# Patient Record
Sex: Male | Born: 1965 | Race: Black or African American | Hispanic: No | Marital: Married | Smoking: Never smoker
Health system: Southern US, Community
[De-identification: ages and names within clinical notes are randomized; demographics above are authoritative.]

## PROBLEM LIST (undated history)

## (undated) DIAGNOSIS — I2699 Other pulmonary embolism without acute cor pulmonale: Secondary | ICD-10-CM

## (undated) DIAGNOSIS — I10 Essential (primary) hypertension: Secondary | ICD-10-CM

## (undated) DIAGNOSIS — Z973 Presence of spectacles and contact lenses: Secondary | ICD-10-CM

## (undated) DIAGNOSIS — I82409 Acute embolism and thrombosis of unspecified deep veins of unspecified lower extremity: Secondary | ICD-10-CM

## (undated) DIAGNOSIS — M109 Gout, unspecified: Secondary | ICD-10-CM

## (undated) DIAGNOSIS — Z789 Other specified health status: Secondary | ICD-10-CM

## (undated) HISTORY — DX: Essential (primary) hypertension: I10

## (undated) HISTORY — PX: WISDOM TOOTH EXTRACTION: SHX21

## (undated) HISTORY — PX: ROTATOR CUFF REPAIR: SHX139

## (undated) HISTORY — DX: Acute embolism and thrombosis of unspecified deep veins of unspecified lower extremity: I82.409

## (undated) HISTORY — DX: Other pulmonary embolism without acute cor pulmonale: I26.99

---

## 2007-04-23 ENCOUNTER — Encounter (INDEPENDENT_AMBULATORY_CARE_PROVIDER_SITE_OTHER): Payer: Self-pay | Admitting: Urology

## 2007-04-23 ENCOUNTER — Other Ambulatory Visit: Admission: RE | Admit: 2007-04-23 | Discharge: 2007-04-23 | Payer: Self-pay | Admitting: Urology

## 2007-05-31 ENCOUNTER — Emergency Department (HOSPITAL_COMMUNITY): Admission: EM | Admit: 2007-05-31 | Discharge: 2007-05-31 | Payer: Self-pay | Admitting: Emergency Medicine

## 2012-08-06 ENCOUNTER — Encounter (HOSPITAL_BASED_OUTPATIENT_CLINIC_OR_DEPARTMENT_OTHER): Payer: Self-pay | Admitting: *Deleted

## 2012-08-06 NOTE — Progress Notes (Signed)
Very nice man in [employer] in [place]-no health problems-no labs needed

## 2012-08-11 ENCOUNTER — Other Ambulatory Visit: Payer: Self-pay | Admitting: Orthopedic Surgery
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
## 2012-08-13 ENCOUNTER — Encounter (HOSPITAL_BASED_OUTPATIENT_CLINIC_OR_DEPARTMENT_OTHER): Admission: RE | Payer: Self-pay | Source: Ambulatory Visit

## 2012-08-13 ENCOUNTER — Ambulatory Visit (HOSPITAL_BASED_OUTPATIENT_CLINIC_OR_DEPARTMENT_OTHER): Admission: RE | Admit: 2012-08-13 | Source: Ambulatory Visit | Admitting: Orthopedic Surgery

## 2012-08-13 HISTORY — DX: Other specified health status: Z78.9

## 2012-08-13 HISTORY — DX: Presence of spectacles and contact lenses: Z97.3

## 2012-08-13 SURGERY — KNEE ARTHROSCOPY WITH PATELLAR TENDON REPAIR
Anesthesia: General | Site: Knee | Laterality: Left

## 2012-08-13 NOTE — Interval H&P Note (Signed)
History and Physical Interval Note:  08/13/2012 7:21 AM  [name] [name]  has presented today for surgery, with the diagnosis of LEFT KNEE TENDON RUPTURE, PATELLAR TENDON NO TRAUMATIC, LOOSE BODY, OSTEOCHONDRAL FRAGMENT  The various methods of treatment have been discussed with the patient and family. After consideration of risks, benefits and other options for treatment, the patient has consented to  Procedure(s): LEFT KNEE ARTHROSCOPY WITH LOOSE BODY EXCISION AND PATELLAR TENDON REPAIR (Left) as a surgical intervention .  The patient's history has been reviewed, patient examined, no change in status, stable for surgery.  I have reviewed the patient's chart and labs.  Questions were answered to the patient's satisfaction.    New intervening injury with possible ACL tear, therefore possible ACL allograft reconstruction.   [name]

## 2012-08-28 ENCOUNTER — Encounter (HOSPITAL_BASED_OUTPATIENT_CLINIC_OR_DEPARTMENT_OTHER): Payer: Self-pay | Admitting: *Deleted

## 2012-08-31 NOTE — H&P (Signed)
[name] [name]/[facility] [address]   [address] [place], [place] [address] [phone] A Division of [facility]  [name] [name], M.D.   [name] [name], M.D.   [name] [name], M.D.   [name] [name], M.D.   [name] [name], M.D [name] [name] Dresser, M.D.  [name] [name], M.D.   [name] [name], M.D.   [name] [name], M.D. [name] Churn. Barry Dienes, PA-C            [name] A. Shepperson, PA-C [name] [place], PA-C [place], [place]   RE: [place], [place]                                [phone]      DOB: [birth year] PROGRESS NOTE: 07-21-12 [name] [name] is seen in consultation today for his left knee.  Previously seen and evaluated by Dr. Farris Has.  Longstanding history of symptoms in the left knee.  This goes back to adolescent years with Osgood-Schlatter's disease.  After finishing growing it became somewhat tolerable, but he has always had some degree of symptoms there.  Prominence of the tibial tubercle.  Symptoms off and on.  This has recently flared up after a 5k race with marked persistent soreness and pain directly in that area.  This has reached a point that he wants to consider definitive treatment, as it has never been right and his symptoms are getting more frequent and more bothersome.  Previous x-rays have shown significant spurring and fragmentation of the tibial tubercle from old Osgood-Schlatter's disease, as well as a loose body up in the patellar tendon just above the tubercle.  I have looked at those films.  There is also a question of a small loose body in the knee in the notch anteriorly.  No significant degenerative changes.  A little deep seated soreness, but no real instability or mechanical symptoms.  I met with he and his wife.  I have reviewed evaluation and treatment notes by Dr. Farris Has.  No scan has been done.  He has tried anti-inflammatories, [name] [name] bracing, icing and rest, as well as a dose pack.  None of this giving him prolonged  relief.  Discussed all of this at length with [name] [name] and his wife.   Remaining history and general exam is outlined and included in the chart.  He is currently still in active [employer].    EXAMINATION: On his exam, marked prominent tibial tubercle on the left.  Exquisitely tender.  A little prominence on the right, not nearly as much.  Doesn't have significant patella alta.  Give way weakness on testing patellar tendon, but no obvious defects.  His knee is stable to stress.  A little synovitis.  Really no meniscal signs.  No instability.  Neurovascularly intact distally.    DISPOSITION:  We have discussed definitive treatment, which I do feel is indicated.  Exam under anesthesia.  Arthroscopy to assess the knee and remove loose body if I find one.  Open intervention tibial tubercle.  I have told him outcome and amount of time to return to full activity is really going to depend on how much of the tendon has to be detached to clean this out.  We have talked about thorough debridement of spur, loose body and then an anchor reattachment patellar tendon.  I told him this could result in a knee immobilizer for six weeks and then another six weeks of rehab, 12  weeks before getting back to full activity.  Obviously if I don't have to disrupt as much of the patellar tendon we can move along more quickly with rehab.  All questions answered.  Paperwork complete.  The risks, benefits and complications were reviewed.  I will see him at the time of operative intervention.  He is going to explore the possibility of going back to light duty and let me know options when I see him at operative intervention.     [name] [name], M.D.   Electronically verified by [name] [name], M.D. [name] [name]:jjh D 07-21-12 T 07-22-12  [name] [name]/[facility] [address]   [address] [place], [place] [address] [phone] A Division of [facility]  [name] [name], M.D.    [name] [name], M.D.   [name] [name], M.D.   [name] [name], M.D.   [name] [name], M.D [name] [name] Dresser, M.D.  [name] [name], M.D.   [name] [name], M.D.   [name] [name], M.D. [name] Churn. Barry Dienes, PA-C            [name] A. Shepperson, PA-C [name] [place], PA-C [place], [place]   RE: [place], [place]                                [phone]      DOB: 1965/07/16 PROGRESS NOTE: 08-25-12 I went over [name] [name]'s MRI and we have talked to him on the phone.  Unfortunately he has exacerbated his previous condition causing a little bit more tearing and inflammatory change at the tibial tubercle.  Other structures in his knee look good in regards to ligaments and meniscus.  There are some inflammatory changes in Hoffa's fat pad and based on previous x-ray there is still a question of some chondral debris, which is obviously not seen on MRI.  In light of these findings, we are going to proceed with our original plan.  Exam under anesthesia, arthroscopy.  Open debridement tibial tubercle with debridement and repair of the patella tendon.  This is what we had planned to do before and I am now reassured that his new injury did not change matters much.  This has been reviewed with him.  I will see him at the time of operative intervention.    [name] [name], M.D.   Electronically verified by [name] [name], M.D. [name] [name]:jjh D 08-25-12 T 08-26-12

## 2012-09-02 ENCOUNTER — Other Ambulatory Visit: Payer: Self-pay | Admitting: Orthopedic Surgery

## 2012-09-03 ENCOUNTER — Encounter (HOSPITAL_BASED_OUTPATIENT_CLINIC_OR_DEPARTMENT_OTHER)
Admission: RE | Disposition: A | Discharge status: Home / Self Care | Payer: Self-pay | Source: Ambulatory Visit | Attending: Orthopedic Surgery

## 2012-09-03 ENCOUNTER — Ambulatory Visit (HOSPITAL_BASED_OUTPATIENT_CLINIC_OR_DEPARTMENT_OTHER)
Admission: RE | Admit: 2012-09-03 | Discharge: 2012-09-03 | Disposition: A | Discharge status: Home / Self Care | Source: Ambulatory Visit | Attending: Orthopedic Surgery | Admitting: Orthopedic Surgery

## 2012-09-03 ENCOUNTER — Ambulatory Visit (HOSPITAL_BASED_OUTPATIENT_CLINIC_OR_DEPARTMENT_OTHER): Admitting: Anesthesiology

## 2012-09-03 ENCOUNTER — Encounter (HOSPITAL_BASED_OUTPATIENT_CLINIC_OR_DEPARTMENT_OTHER): Payer: Self-pay | Admitting: Anesthesiology

## 2012-09-03 DIAGNOSIS — M2340 Loose body in knee, unspecified knee: Secondary | ICD-10-CM | POA: Insufficient documentation

## 2012-09-03 DIAGNOSIS — M66269 Spontaneous rupture of extensor tendons, unspecified lower leg: Secondary | ICD-10-CM | POA: Insufficient documentation

## 2012-09-03 DIAGNOSIS — M928 Other specified juvenile osteochondrosis: Secondary | ICD-10-CM | POA: Insufficient documentation

## 2012-09-03 HISTORY — PX: KNEE ARTHROSCOPY WITH PATELLAR TENDON REPAIR: SHX5656

## 2012-09-03 LAB — POCT HEMOGLOBIN-HEMACUE: Hemoglobin: 15 g/dL (ref 13.0–17.0)

## 2012-09-03 SURGERY — KNEE ARTHROSCOPY WITH PATELLAR TENDON REPAIR
Anesthesia: Regional | Site: Knee | Laterality: Left | Wound class: Clean

## 2012-09-03 MED ORDER — OXYCODONE-ACETAMINOPHEN 10-325 MG PO TABS: 1.0000 | ORAL_TABLET | Freq: Four times a day (QID) | ORAL | Status: DC | PRN

## 2012-09-03 MED ORDER — CHLORHEXIDINE GLUCONATE 4 % EX LIQD: 60.0000 mL | Freq: Once | CUTANEOUS | Status: DC

## 2012-09-03 MED ORDER — LACTATED RINGERS IV SOLN
INTRAVENOUS | Status: DC
  Administered 2012-09-03: 10:00:00 via INTRAVENOUS

## 2012-09-03 MED ORDER — HYDROMORPHONE HCL PF 1 MG/ML IJ SOLN
0.2500 mg | INTRAMUSCULAR | Status: DC | PRN
  Administered 2012-09-03 (×2): 0.5 mg via INTRAVENOUS

## 2012-09-03 MED ORDER — PROPOFOL 10 MG/ML IV BOLUS
INTRAVENOUS | Status: DC | PRN
  Administered 2012-09-03: 260 mg via INTRAVENOUS

## 2012-09-03 MED ORDER — OXYCODONE HCL 5 MG PO TABS: 5.0000 mg | ORAL_TABLET | Freq: Once | ORAL | Status: DC | PRN

## 2012-09-03 MED ORDER — LIDOCAINE HCL (CARDIAC) 20 MG/ML IV SOLN
INTRAVENOUS | Status: DC | PRN
  Administered 2012-09-03: 80 mg via INTRAVENOUS

## 2012-09-03 MED ORDER — OXYCODONE HCL 5 MG/5ML PO SOLN: 5.0000 mg | Freq: Once | ORAL | Status: DC | PRN

## 2012-09-03 MED ORDER — MIDAZOLAM HCL 2 MG/2ML IJ SOLN
1.0000 mg | INTRAMUSCULAR | Status: DC | PRN
  Administered 2012-09-03: 2 mg via INTRAVENOUS

## 2012-09-03 MED ORDER — ONDANSETRON HCL 4 MG/2ML IJ SOLN
INTRAMUSCULAR | Status: DC | PRN
  Administered 2012-09-03: 4 mg via INTRAVENOUS

## 2012-09-03 MED ORDER — BUPIVACAINE-EPINEPHRINE PF 0.5-1:200000 % IJ SOLN
INTRAMUSCULAR | Status: DC | PRN
  Administered 2012-09-03: 25 mL

## 2012-09-03 MED ORDER — SODIUM CHLORIDE 0.9 % IR SOLN
Status: DC | PRN
  Administered 2012-09-03: 6000 mL

## 2012-09-03 MED ORDER — LACTATED RINGERS IV SOLN: INTRAVENOUS | Status: DC

## 2012-09-03 MED ORDER — CEFAZOLIN SODIUM-DEXTROSE 2-3 GM-% IV SOLR: 2.0000 g | INTRAVENOUS | Status: DC

## 2012-09-03 MED ORDER — FENTANYL CITRATE 0.05 MG/ML IJ SOLN
50.0000 ug | INTRAMUSCULAR | Status: DC | PRN
  Administered 2012-09-03: 100 ug via INTRAVENOUS

## 2012-09-03 MED ORDER — METOCLOPRAMIDE HCL 5 MG/ML IJ SOLN: 10.0000 mg | Freq: Once | INTRAMUSCULAR | Status: DC | PRN

## 2012-09-03 MED ORDER — DEXAMETHASONE SODIUM PHOSPHATE 4 MG/ML IJ SOLN
INTRAMUSCULAR | Status: DC | PRN
  Administered 2012-09-03: 10 mg via INTRAVENOUS

## 2012-09-03 MED ORDER — DEXTROSE 5 % IV SOLN
3.0000 g | INTRAVENOUS | Status: AC
  Administered 2012-09-03: 2 g via INTRAVENOUS

## 2012-09-03 MED ORDER — FENTANYL CITRATE 0.05 MG/ML IJ SOLN
INTRAMUSCULAR | Status: DC | PRN
  Administered 2012-09-03 (×2): 50 ug via INTRAVENOUS

## 2012-09-03 SURGICAL SUPPLY — 64 items
ANCHOR CORKSCREW BIO 4.5 (Anchor) ×2 IMPLANT
BANDAGE ELASTIC 6 VELCRO ST LF (GAUZE/BANDAGES/DRESSINGS) ×2 IMPLANT
BANDAGE ESMARK 6X9 LF (GAUZE/BANDAGES/DRESSINGS) ×1 IMPLANT
BENZOIN TINCTURE PRP APPL 2/3 (GAUZE/BANDAGES/DRESSINGS) ×2 IMPLANT
BLADE CUDA 5.5 (BLADE) IMPLANT
BLADE CUDA GRT WHITE 3.5 (BLADE) IMPLANT
BLADE CUTTER GATOR 3.5 (BLADE) ×2 IMPLANT
BLADE CUTTER MENIS 5.5 (BLADE) IMPLANT
BLADE GREAT WHITE 4.2 (BLADE) ×2 IMPLANT
BLADE SURG 15 STRL LF DISP TIS (BLADE) ×2 IMPLANT
BLADE SURG 15 STRL SS (BLADE) ×2
BNDG ESMARK 6X9 LF (GAUZE/BANDAGES/DRESSINGS) ×2
BUR OVAL 4.0 (BURR) IMPLANT
CANISTER OMNI JUG 16 LITER (MISCELLANEOUS) IMPLANT
CANISTER SUCTION 2500CC (MISCELLANEOUS) IMPLANT
CLOTH BEACON ORANGE TIMEOUT ST (SAFETY) ×2 IMPLANT
CUTTER MENISCUS  4.2MM (BLADE)
CUTTER MENISCUS 4.2MM (BLADE) IMPLANT
DRAPE ARTHROSCOPY W/POUCH 90 (DRAPES) ×2 IMPLANT
DRAPE U-SHAPE 47X51 STRL (DRAPES) ×2 IMPLANT
DURAPREP 26ML APPLICATOR (WOUND CARE) ×2 IMPLANT
ELECT MENISCUS 165MM 90D (ELECTRODE) IMPLANT
ELECT REM PT RETURN 9FT ADLT (ELECTROSURGICAL) ×2
ELECTRODE REM PT RTRN 9FT ADLT (ELECTROSURGICAL) ×1 IMPLANT
GAUZE XEROFORM 1X8 LF (GAUZE/BANDAGES/DRESSINGS) ×2 IMPLANT
GLOVE BIOGEL M STRL SZ7.5 (GLOVE) ×2 IMPLANT
GLOVE BIOGEL PI IND STRL 8 (GLOVE) ×1 IMPLANT
GLOVE BIOGEL PI INDICATOR 8 (GLOVE) ×1
GLOVE ECLIPSE 6.5 STRL STRAW (GLOVE) ×2 IMPLANT
GLOVE EXAM NITRILE LRG STRL (GLOVE) ×2 IMPLANT
GLOVE ORTHO TXT STRL SZ7.5 (GLOVE) ×2 IMPLANT
GLOVE SURG SS PI 6.5 STRL IVOR (GLOVE) ×2 IMPLANT
GOWN PREVENTION PLUS XLARGE (GOWN DISPOSABLE) ×4 IMPLANT
GOWN PREVENTION PLUS XXLARGE (GOWN DISPOSABLE) ×4 IMPLANT
GOWN STRL REIN 2XL XLG LVL4 (GOWN DISPOSABLE) IMPLANT
IMMOBILIZER KNEE 22 UNIV (SOFTGOODS) IMPLANT
IMMOBILIZER KNEE 24 THIGH 36 (MISCELLANEOUS) ×1 IMPLANT
IMMOBILIZER KNEE 24 UNIV (MISCELLANEOUS) ×2
IV NS IRRIG 3000ML ARTHROMATIC (IV SOLUTION) ×4 IMPLANT
KNEE WRAP E Z 3 GEL PACK (MISCELLANEOUS) ×2 IMPLANT
NDL SUT 6 .5 CRC .975X.05 MAYO (NEEDLE) ×1 IMPLANT
NEEDLE MAYO TAPER (NEEDLE) ×1
PACK ARTHROSCOPY DSU (CUSTOM PROCEDURE TRAY) ×2 IMPLANT
PACK BASIN DAY SURGERY FS (CUSTOM PROCEDURE TRAY) ×2 IMPLANT
PENCIL BUTTON HOLSTER BLD 10FT (ELECTRODE) ×2 IMPLANT
SET ARTHROSCOPY TUBING (MISCELLANEOUS) ×2
SET ARTHROSCOPY TUBING LN (MISCELLANEOUS) ×2 IMPLANT
SPONGE GAUZE 4X4 12PLY (GAUZE/BANDAGES/DRESSINGS) ×4 IMPLANT
SPONGE LAP 4X18 X RAY DECT (DISPOSABLE) ×2 IMPLANT
STRIP CLOSURE SKIN 1/2X4 (GAUZE/BANDAGES/DRESSINGS) ×2 IMPLANT
SUCTION FRAZIER TIP 10 FR DISP (SUCTIONS) ×2 IMPLANT
SUT ETHILON 3 0 PS 1 (SUTURE) ×2 IMPLANT
SUT FIBERWIRE #2 38 T-5 BLUE (SUTURE)
SUT VIC AB 0 CT1 27 (SUTURE) ×1
SUT VIC AB 0 CT1 27XBRD ANBCTR (SUTURE) ×1 IMPLANT
SUT VIC AB 1 CT1 27 (SUTURE) ×1
SUT VIC AB 1 CT1 27XBRD ANBCTR (SUTURE) ×1 IMPLANT
SUT VIC AB 3-0 FS2 27 (SUTURE) ×2 IMPLANT
SUT VIC AB 3-0 SH 27 (SUTURE) ×1
SUT VIC AB 3-0 SH 27X BRD (SUTURE) ×1 IMPLANT
SUTURE FIBERWR #2 38 T-5 BLUE (SUTURE) IMPLANT
TOWEL OR 17X24 6PK STRL BLUE (TOWEL DISPOSABLE) ×2 IMPLANT
WATER STERILE IRR 1000ML POUR (IV SOLUTION) ×2 IMPLANT
YANKAUER SUCT BULB TIP NO VENT (SUCTIONS) ×2 IMPLANT

## 2012-09-03 NOTE — Progress Notes (Signed)
Assisted Dr. Frederick with left, ultrasound guided, femoral block. Side rails up, monitors on throughout procedure. See vital signs in flow sheet. Tolerated Procedure well. 

## 2012-09-03 NOTE — Anesthesia Postprocedure Evaluation (Signed)
Anesthesia Post Note  Patient: [name] [name]  Procedure(s) Performed: Procedure(s) (LRB): LEFT KNEE ARTHROSCOPY, LOOSE BODY EXCISION, PATELLAR TENDON REPAIR, EXCISION TIBIAL TUBERCLE SPUR (Left)  Anesthesia type: General  Patient location: PACU  Post pain: Pain level controlled  Post assessment: Patient's Cardiovascular Status Stable  Last Vitals:  Filed Vitals:   09/03/12 1300  BP: 141/92  Pulse: 71  Temp:   Resp: 15    Post vital signs: Reviewed and stable  Level of consciousness: alert  Complications: No apparent anesthesia complications

## 2012-09-03 NOTE — Interval H&P Note (Signed)
History and Physical Interval Note:  09/03/2012 7:32 AM  [name] [name]  has presented today for surgery, with the diagnosis of LEFT KNEE TENDON RUPTURE-PATELLAR TENDON NON TRAUMATIC, LOOSE BODY-OSTEOCHONDRAL FRAGMENT  The various methods of treatment have been discussed with the patient and family. After consideration of risks, benefits and other options for treatment, the patient has consented to  Procedure(s): LEFT KNEE ARTHROSCOPY, LOOSE BODY EXCISION, PATELLAR TENDON REPAIR, EXCISION TIBIAL TUBERCLE SPUR (Left) as a surgical intervention .  The patient's history has been reviewed, patient examined, no change in status, stable for surgery.  I have reviewed the patient's chart and labs.  Questions were answered to the patient's satisfaction.     Vela Render F

## 2012-09-03 NOTE — Anesthesia Preprocedure Evaluation (Signed)
Anesthesia Evaluation  Patient identified by MRN, date of birth, ID band Patient awake    Reviewed: Allergy & Precautions, H&P , NPO status , Patient's Chart, lab work & pertinent test results, reviewed documented beta blocker date and time   Airway Mallampati: II TM Distance: >3 FB Neck ROM: full    Dental   Pulmonary neg pulmonary ROS,  breath sounds clear to auscultation        Cardiovascular negative cardio ROS  Rhythm:regular     Neuro/Psych negative neurological ROS  negative psych ROS   GI/Hepatic negative GI ROS, Neg liver ROS,   Endo/Other  negative endocrine ROS  Renal/GU negative Renal ROS  negative genitourinary   Musculoskeletal   Abdominal   Peds  Hematology negative hematology ROS (+)   Anesthesia Other Findings See surgeon's H&P   Reproductive/Obstetrics negative OB ROS                           Anesthesia Physical Anesthesia Plan  ASA: II  Anesthesia Plan: General   Post-op Pain Management:    Induction: Intravenous  Airway Management Planned: LMA  Additional Equipment:   Intra-op Plan:   Post-operative Plan:   Informed Consent: I have reviewed the patients History and Physical, chart, labs and discussed the procedure including the risks, benefits and alternatives for the proposed anesthesia with the patient or authorized representative who has indicated his/her understanding and acceptance.   Dental Advisory Given  Plan Discussed with: CRNA and Surgeon  Anesthesia Plan Comments:         Anesthesia Quick Evaluation  

## 2012-09-03 NOTE — Transfer of Care (Signed)
Immediate Anesthesia Transfer of Care Note  Patient: [name] [name]  Procedure(s) Performed: Procedure(s): LEFT KNEE ARTHROSCOPY, LOOSE BODY EXCISION, PATELLAR TENDON REPAIR, EXCISION TIBIAL TUBERCLE SPUR (Left)  Patient Location: PACU  Anesthesia Type:GA combined with regional for post-op pain  Level of Consciousness: sedated  Airway & Oxygen Therapy: Patient Spontanous Breathing and Patient connected to face mask oxygen  Post-op Assessment: Report given to PACU RN and Post -op Vital signs reviewed and stable  Post vital signs: Reviewed and stable  Complications: No apparent anesthesia complications

## 2012-09-03 NOTE — Interval H&P Note (Signed)
History and Physical Interval Note:  09/03/2012 7:32 AM  [name] [name]  has presented today for surgery, with the diagnosis of LEFT KNEE TENDON RUPTURE-PATELLAR TENDON NON TRAUMATIC, LOOSE BODY-OSTEOCHONDRAL FRAGMENT  The various methods of treatment have been discussed with the patient and family. After consideration of risks, benefits and other options for treatment, the patient has consented to  Procedure(s): LEFT KNEE ARTHROSCOPY, LOOSE BODY EXCISION, PATELLAR TENDON REPAIR, EXCISION TIBIAL TUBERCLE SPUR (Left) as a surgical intervention .  The patient's history has been reviewed, patient examined, no change in status, stable for surgery.  I have reviewed the patient's chart and labs.  Questions were answered to the patient's satisfaction.     [name]   

## 2012-09-03 NOTE — Anesthesia Procedure Notes (Addendum)
Anesthesia Regional Block:  Femoral nerve block  Pre-Anesthetic Checklist: ,, timeout performed, Correct Patient, Correct Site, Correct Laterality, Correct Procedure, Correct Position, site marked, Risks and benefits discussed,  Surgical consent,  Pre-op evaluation,  At surgeon's request and post-op pain management  Laterality: Left  Prep: chloraprep       Needles:   Needle Type: Other     Needle Length: 9cm  Needle Gauge: 21    Additional Needles:  Procedures: ultrasound guided (picture in chart) Femoral nerve block Narrative:  Start time: 09/03/2012 9:52 AM End time: 09/03/2012 9:58 AM Injection made incrementally with aspirations every 5 mL.  Performed by: Personally  Anesthesiologist: C. Frederick MD  Additional Notes: Ultrasound guidance used to: id relevant anatomy, confirm needle position, local anesthetic spread, avoidance of vascular puncture. Picture saved. No complications. Block performed personally by Janetta Hora. Gelene Mink, MD    Femoral nerve block Procedure Name: LMA Insertion Performed by: York Grice Pre-anesthesia Checklist: Patient identified, Timeout performed, Emergency Drugs available, Suction available and Patient being monitored Patient Re-evaluated:Patient Re-evaluated prior to inductionOxygen Delivery Method: Circle system utilized Preoxygenation: Pre-oxygenation with 100% oxygen Intubation Type: IV induction Ventilation: Mask ventilation without difficulty LMA: LMA with gastric port inserted LMA Size: 4.0 Number of attempts: 1 Placement Confirmation: breath sounds checked- equal and bilateral and positive ETCO2 Tube secured with: Tape Dental Injury: Teeth and Oropharynx as per pre-operative assessment

## 2012-09-04 ENCOUNTER — Encounter (HOSPITAL_BASED_OUTPATIENT_CLINIC_OR_DEPARTMENT_OTHER): Payer: Self-pay | Admitting: Orthopedic Surgery

## 2012-09-04 NOTE — Op Note (Signed)
NAME:  [name] [name], [name] [name]                 ACCOUNT NO.:  [id number]  MEDICAL RECORD NO.:  [id number]  LOCATION:                               FACILITY:  [facility]  PHYSICIAN:  Loreta Ave, M.D. DATE OF BIRTH:  1966-03-06  DATE OF PROCEDURE:  09/03/2012 DATE OF DISCHARGE:  09/03/2012                              OPERATIVE REPORT   PREOPERATIVE DIAGNOSIS:  Left knee chronic Osgood-Schlatter's disease. Loose bodies, tibial tubercle.  Severe tendinosis tearing of patellar tendon at the attachment.  Reactive synovitis of the knee with question loose bodies.  POSTOPERATIVE DIAGNOSIS:  Left knee chronic Osgood-Schlatter's disease. Loose bodies, tibial tubercle.  Severe tendinosis tearing of patellar tendon at the attachment.  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 Reactive synovitis throughout the knee, especially anteriorly and a large medial plica.  PROCEDURE:  Left knee exam under anesthesia, arthroscopy.  Chondroplasty of the patellofemoral joint, medial femoral condyle.  Removal of loose bodies.  Partial synovectomy, excision of medial plica.  Open debridement of the patellar tendon.  Removal of tibial tubercle spurs and loose bodies.  Repair of patellar tendon back to the attachment site augmented with a Bio composite 4.5 mm anchor and FiberWire suture.  SURGEON:  Loreta Ave, M.D.  ASSISTANT:  Skip Mayer, PA, present throughout the entire case and necessary for timely completion of the procedure.  ANESTHESIA:  General.  ESTIMATED BLOOD LOSS:  Minimal.  SPECIMENS:  None.  CULTURES:  None.  COMPLICATION:  None.  DRESSING:  Soft  compressive knee immobilizer.  TOURNIQUET TIME:  45 minutes.  DETAILS OF THE PROCEDURE:  The patient was brought to operating room, placed on the operating table in supine position.  After adequate anesthesia had been obtained, tourniquet applied.  Prepped and draped in the usual sterile fashion.  Exsanguinated with elevation of Esmarch. Tourniquet inflated to 350 mmHg.  Straight incision from below the patella over the tibial tubercle where there was a marked prominence. Skin and subcutaneous tissues were divided.  Patella was then exposed. Inferior 1-2 cm over more than the middle 50% had marked tendinosis, partial tearing, severe degeneration.  Open longitudinally up and healthy tissue.  Retracted both ways and debrided back to healthy tissue.  The tibial tubercle exposed.  Loose body removed.  The bone taken down to a nice smooth contour removing all prominence. Fluoroscopic confirmation.  Wound irrigated.  I then placed a 4.5 mm anchor at the attachment site.  The tendon was repaired to that utilizing FiberWire suture in a baseball stitch.  Nice firm re-anchored and reattachment.  I could bring it through full motion without too much tension.  Utilizing the anterior incision, I had made anteromedial and anterolateral portals.  Arthroscope was introduced.  Knee distended and inspected.  A lot of chondral debris and loose bodies.  Chondroplasty to a stable surface throughout.  Grade 3 change  on the trochlea, medial femoral condyle, all debrided.  Reasonable tracking.  Cruciate ligaments intact.  Both menisci were intact without tears.  Large fibrotic medial plica excised.  Hypertrophic synovitis from the knee, all debrided.  The entire knee examined to be sure all chondral loose bodies were removed. Instruments were completely removed.  Wound irrigated.  Closed with Vicryl with a subcuticular closure.  Sterile compressive dressing applied.  Tourniquet deflated and removed.  Knee  immobilizer applied. Anesthesia reversed.  Brought to the recovery room.  Tolerated the surgery well.  No complications.     Loreta Ave, M.D.     DFM/MEDQ  D:  09/03/2012  T:  09/04/2012  Job:  [id number]

## 2012-10-21 ENCOUNTER — Encounter: Payer: Self-pay | Admitting: Family Medicine

## 2012-10-21 ENCOUNTER — Ambulatory Visit (INDEPENDENT_AMBULATORY_CARE_PROVIDER_SITE_OTHER): Admitting: Family Medicine

## 2012-10-21 VITALS — BP 133/81 | HR 67 | Temp 98.4°F | Ht 73.0 in | Wt 242.0 lb

## 2012-10-21 DIAGNOSIS — IMO0002 Reserved for concepts with insufficient information to code with codable children: Secondary | ICD-10-CM

## 2012-10-21 DIAGNOSIS — M1712 Unilateral primary osteoarthritis, left knee: Secondary | ICD-10-CM

## 2012-10-21 DIAGNOSIS — M1710 Unilateral primary osteoarthritis, unspecified knee: Secondary | ICD-10-CM

## 2012-10-21 NOTE — Patient Instructions (Signed)
Knee Pain  The knee is the complex joint between your thigh and your lower leg. It is made up of bones, tendons, ligaments, and cartilage. The bones that make up the knee are:   The femur in the thigh.   The tibia and fibula in the lower leg.   The patella or kneecap riding in the groove on the lower femur.  CAUSES   Knee pain is a common complaint with many causes. A few of these causes are:   Injury, such as:   A ruptured ligament or tendon injury.   Torn cartilage.   Medical conditions, such as:   Gout   Arthritis   Infections   Overuse, over training or overdoing a physical activity.  Knee pain can be minor or severe. Knee pain can accompany debilitating injury. Minor knee problems often respond well to self-care measures or get well on their own. More serious injuries may need medical intervention or even surgery.  SYMPTOMS  The knee is complex. Symptoms of knee problems can vary widely. Some of the problems are:   Pain with movement and weight bearing.   Swelling and tenderness.   Buckling of the knee.   Inability to straighten or extend your knee.   Your knee locks and you cannot straighten it.   Warmth and redness with pain and fever.   Deformity or dislocation of the kneecap.  DIAGNOSIS   Determining what is wrong may be very straight forward such as when there is an injury. It can also be challenging because of the complexity of the knee. Tests to make a diagnosis may include:   Your caregiver taking a history and doing a physical exam.   Routine X-rays can be used to rule out other problems. X-rays will not reveal a cartilage tear. Some injuries of the knee can be diagnosed by:   Arthroscopy a surgical technique by which a small video camera is inserted through tiny incisions on the sides of the knee. This procedure is used to examine and repair internal knee joint problems. Tiny instruments can be used during arthroscopy to repair the torn knee cartilage (meniscus).   Arthrography  is a radiology technique. A contrast liquid is directly injected into the knee joint. Internal structures of the knee joint then become visible on X-ray film.   An MRI scan is a non x-ray radiology procedure in which magnetic fields and a computer produce two- or three-dimensional images of the inside of the knee. Cartilage tears are often visible using an MRI scanner. MRI scans have largely replaced arthrography in diagnosing cartilage tears of the knee.   Blood work.   Examination of the fluid that helps to lubricate the knee joint (synovial fluid). This is done by taking a sample out using a needle and a syringe.  TREATMENT  The treatment of knee problems depends on the cause. Some of these treatments are:   Depending on the injury, proper casting, splinting, surgery or physical therapy care will be needed.   Give yourself adequate recovery time. Do not overuse your joints. If you begin to get sore during workout routines, back off. Slow down or do fewer repetitions.   For repetitive activities such as cycling or running, maintain your strength and nutrition.   Alternate muscle groups. For example if you are a weight lifter, work the upper body on one day and the lower body the next.   Either tight or weak muscles do not give the proper support for your   knee. Tight or weak muscles do not absorb the stress placed on the knee joint. Keep the muscles surrounding the knee strong.   Take care of mechanical problems.   If you have flat feet, orthotics or special shoes may help. See your caregiver if you need help.   Arch supports, sometimes with wedges on the inner or outer aspect of the heel, can help. These can shift pressure away from the side of the knee most bothered by osteoarthritis.   A brace called an "unloader" brace also may be used to help ease the pressure on the most arthritic side of the knee.   If your caregiver has prescribed crutches, braces, wraps or ice, use as directed. The acronym for  this is PRICE. This means protection, rest, ice, compression and elevation.   Nonsteroidal anti-inflammatory drugs (NSAID's), can help relieve pain. But if taken immediately after an injury, they may actually increase swelling. Take NSAID's with food in your stomach. Stop them if you develop stomach problems. Do not take these if you have a history of ulcers, stomach pain or bleeding from the bowel. Do not take without your caregiver's approval if you have problems with fluid retention, heart failure, or kidney problems.   For ongoing knee problems, physical therapy may be helpful.   Glucosamine and chondroitin are over-the-counter dietary supplements. Both may help relieve the pain of osteoarthritis in the knee. These medicines are different from the usual anti-inflammatory drugs. Glucosamine may decrease the rate of cartilage destruction.   Injections of a corticosteroid drug into your knee joint may help reduce the symptoms of an arthritis flare-up. They may provide pain relief that lasts a few months. You may have to wait a few months between injections. The injections do have a small increased risk of infection, water retention and elevated blood sugar levels.   Hyaluronic acid injected into damaged joints may ease pain and provide lubrication. These injections may work by reducing inflammation. A series of shots may give relief for as long as 6 months.   Topical painkillers. Applying certain ointments to your skin may help relieve the pain and stiffness of osteoarthritis. Ask your pharmacist for suggestions. Many over the-counter products are approved for temporary relief of arthritis pain.   In some countries, doctors often prescribe topical NSAID's for relief of chronic conditions such as arthritis and tendinitis. A review of treatment with NSAID creams found that they worked as well as oral medications but without the serious side effects.  PREVENTION   Maintain a healthy weight. Extra pounds put  more strain on your joints.   Get strong, stay limber. Weak muscles are a common cause of knee injuries. Stretching is important. Include flexibility exercises in your workouts.   Be smart about exercise. If you have osteoarthritis, chronic knee pain or recurring injuries, you may need to change the way you exercise. This does not mean you have to stop being active. If your knees ache after jogging or playing basketball, consider switching to swimming, water aerobics or other low-impact activities, at least for a few days a week. Sometimes limiting high-impact activities will provide relief.   Make sure your shoes fit well. Choose footwear that is right for your sport.   Protect your knees. Use the proper gear for knee-sensitive activities. Use kneepads when playing volleyball or laying carpet. Buckle your seat belt every time you drive. Most shattered kneecaps occur in car accidents.   Rest when you are tired.  SEEK MEDICAL CARE IF:     You have knee pain that is continual and does not seem to be getting better.   SEEK IMMEDIATE MEDICAL CARE IF:   Your knee joint feels hot to the touch and you have a high fever.  MAKE SURE YOU:    Understand these instructions.   Will watch your condition.   Will get help right away if you are not doing well or get worse.  Document Released: 12/23/2006 Document Revised: 05/20/2011 Document Reviewed: 12/23/2006  [employer] Patient Information 2014 [employer].

## 2012-10-21 NOTE — Progress Notes (Signed)
  Subjective:    Patient ID: [name] J Guinea-Bissau Jr., male    DOB: October 01, 1965, 47 y.o.   MRN: [id number]  HPI This 47 y.o. male presents for evaluation of right leg and knee discomfort.  He has had right Knee surgery on 10/03/12 and has been seeing orthopedics who are prescribing him pain medication. He has been having some swelling in his right leg, knee, and ankle but it is better since surgery. He denies any pain in his calf or thigh just in his right knee.    Review of Systems C/o right knee pain No chest pain, SOB, HA, dizziness, vision change, N/V, diarrhea, constipation, dysuria, urinary urgency or frequency, myalgias or rash.     Objective:   Physical Exam Vital signs noted  Well developed well nourished male.  HEENT - Head atraumatic Normocephalic                Eyes - PERRLA, Conjuctiva - clear Sclera- Clear EOMI                Ears - EAC's Wnl TM's Wnl Gross Hearing WNL                Nose - Nares patent                 Throat - oropharanx wnl Respiratory - Lungs CTA bilateral Cardiac - RRR S1 and S2 without murmur MS - Right Knee with swelling and incision mid patella with steri strips Approximating.  Swelling in his right  Ankle and pre-tibial region.       Assessment & Plan:  Left knee DJD - Plan: Ambulatory referral to Physical Therapy.  Discussed with patient that he needs To get pain medicine from ortho since he is being rx'd pain meds by them and is still following up with Them.  He has family hx of colon cancer.  I have advised him to follow up for CPE and he needs colonoscopy and that He should follow up in 3 months.

## 2012-10-27 ENCOUNTER — Telehealth: Payer: Self-pay | Admitting: Family Medicine

## 2012-11-04 ENCOUNTER — Ambulatory Visit: Attending: Family Medicine | Admitting: Physical Therapy

## 2012-11-04 DIAGNOSIS — IMO0001 Reserved for inherently not codable concepts without codable children: Secondary | ICD-10-CM | POA: Insufficient documentation

## 2012-11-04 DIAGNOSIS — R5381 Other malaise: Secondary | ICD-10-CM | POA: Insufficient documentation

## 2012-11-04 DIAGNOSIS — M25669 Stiffness of unspecified knee, not elsewhere classified: Secondary | ICD-10-CM | POA: Insufficient documentation

## 2012-11-04 DIAGNOSIS — M25569 Pain in unspecified knee: Secondary | ICD-10-CM | POA: Insufficient documentation

## 2012-11-04 DIAGNOSIS — R269 Unspecified abnormalities of gait and mobility: Secondary | ICD-10-CM | POA: Insufficient documentation

## 2012-11-05 ENCOUNTER — Ambulatory Visit: Admitting: Physical Therapy

## 2012-11-10 ENCOUNTER — Ambulatory Visit: Attending: Family Medicine | Admitting: *Deleted

## 2012-11-10 DIAGNOSIS — M25669 Stiffness of unspecified knee, not elsewhere classified: Secondary | ICD-10-CM | POA: Insufficient documentation

## 2012-11-10 DIAGNOSIS — IMO0001 Reserved for inherently not codable concepts without codable children: Secondary | ICD-10-CM | POA: Insufficient documentation

## 2012-11-10 DIAGNOSIS — R269 Unspecified abnormalities of gait and mobility: Secondary | ICD-10-CM | POA: Insufficient documentation

## 2012-11-10 DIAGNOSIS — M25569 Pain in unspecified knee: Secondary | ICD-10-CM | POA: Insufficient documentation

## 2012-11-10 DIAGNOSIS — R5381 Other malaise: Secondary | ICD-10-CM | POA: Insufficient documentation

## 2012-11-12 ENCOUNTER — Ambulatory Visit: Admitting: *Deleted

## 2012-11-16 ENCOUNTER — Ambulatory Visit: Admitting: Physical Therapy

## 2012-11-18 ENCOUNTER — Telehealth: Payer: Self-pay | Admitting: Family Medicine

## 2012-11-18 ENCOUNTER — Ambulatory Visit: Admitting: Physical Therapy

## 2012-11-23 NOTE — Telephone Encounter (Signed)
Form is ready and lm for pt.

## 2012-11-23 NOTE — Telephone Encounter (Signed)
I am not aware of any form but maybe it is in his paper chart?

## 2012-11-24 ENCOUNTER — Ambulatory Visit: Admitting: Physical Therapy

## 2012-11-26 ENCOUNTER — Ambulatory Visit: Admitting: *Deleted

## 2012-12-01 ENCOUNTER — Ambulatory Visit: Admitting: Physical Therapy

## 2012-12-03 ENCOUNTER — Ambulatory Visit: Admitting: Physical Therapy

## 2012-12-10 ENCOUNTER — Encounter: Admitting: Physical Therapy

## 2012-12-17 ENCOUNTER — Ambulatory Visit: Attending: Family Medicine | Admitting: Physical Therapy

## 2012-12-17 DIAGNOSIS — M25569 Pain in unspecified knee: Secondary | ICD-10-CM | POA: Insufficient documentation

## 2012-12-17 DIAGNOSIS — R269 Unspecified abnormalities of gait and mobility: Secondary | ICD-10-CM | POA: Insufficient documentation

## 2012-12-17 DIAGNOSIS — M25669 Stiffness of unspecified knee, not elsewhere classified: Secondary | ICD-10-CM | POA: Insufficient documentation

## 2012-12-17 DIAGNOSIS — IMO0001 Reserved for inherently not codable concepts without codable children: Secondary | ICD-10-CM | POA: Insufficient documentation

## 2012-12-17 DIAGNOSIS — R5381 Other malaise: Secondary | ICD-10-CM | POA: Insufficient documentation

## 2012-12-21 ENCOUNTER — Ambulatory Visit: Admitting: Physical Therapy

## 2012-12-25 ENCOUNTER — Ambulatory Visit: Admitting: Physical Therapy

## 2013-01-14 ENCOUNTER — Other Ambulatory Visit: Payer: Self-pay

## 2013-02-15 ENCOUNTER — Ambulatory Visit (INDEPENDENT_AMBULATORY_CARE_PROVIDER_SITE_OTHER): Admitting: Family Medicine

## 2013-02-15 ENCOUNTER — Encounter: Payer: Self-pay | Admitting: Family Medicine

## 2013-02-15 VITALS — BP 117/73 | HR 66 | Temp 98.4°F | Ht 72.5 in | Wt 252.6 lb

## 2013-02-15 DIAGNOSIS — Z800 Family history of malignant neoplasm of digestive organs: Secondary | ICD-10-CM

## 2013-02-15 DIAGNOSIS — Z0000 Encounter for general adult medical examination without abnormal findings: Secondary | ICD-10-CM

## 2013-02-15 DIAGNOSIS — Z139 Encounter for screening, unspecified: Secondary | ICD-10-CM

## 2013-02-15 LAB — POCT CBC
Granulocyte percent: 67.1 %G (ref 37–80)
HCT, POC: 43.8 % (ref 43.5–53.7)
Hemoglobin: 14 g/dL — AB (ref 14.1–18.1)
Lymph, poc: 1.6 (ref 0.6–3.4)
MCH, POC: 28.1 pg (ref 27–31.2)
MCHC: 32.1 g/dL (ref 31.8–35.4)
MCV: 87.4 fL (ref 80–97)
MPV: 7.7 fL (ref 0–99.8)
POC Granulocyte: 4.4 (ref 2–6.9)
POC LYMPH PERCENT: 23.8 %L (ref 10–50)
Platelet Count, POC: 210 10*3/uL (ref 142–424)
RBC: 5 M/uL (ref 4.69–6.13)
RDW, POC: 13.4 %
WBC: 6.6 10*3/uL (ref 4.6–10.2)

## 2013-02-15 NOTE — Patient Instructions (Signed)
Colonoscopy A colonoscopy is an exam to evaluate your entire colon. In this exam, your colon is cleansed. A long fiberoptic tube is inserted through your rectum and into your colon. The fiberoptic scope (endoscope) is a long bundle of enclosed and very flexible fibers. These fibers transmit light to the area examined and send images from that area to your caregiver. Discomfort is usually minimal. You may be given a drug to help you sleep (sedative) during or prior to the procedure. This exam helps to detect lumps (tumors), polyps, inflammation, and areas of bleeding. Your caregiver may also take a small piece of tissue (biopsy) that will be examined under a microscope. LET YOUR CAREGIVER KNOW ABOUT:   Allergies to food or medicine.  Medicines taken, including vitamins, herbs, eyedrops, over-the-counter medicines, and creams.  Use of steroids (by mouth or creams).  Previous problems with anesthetics or numbing medicines.  History of bleeding problems or blood clots.  Previous surgery.  Other health problems, including diabetes and kidney problems.  Possibility of pregnancy, if this applies. BEFORE THE PROCEDURE   A clear liquid diet may be required for 2 days before the exam.  Ask your caregiver about changing or stopping your regular medications.  Liquid injections (enemas) or laxatives may be required.  A large amount of electrolyte solution may be given to you to drink over a short period of time. This solution is used to clean out your colon.  You should be present 60 minutes prior to your procedure or as directed by your caregiver. AFTER THE PROCEDURE   If you received a sedative or pain relieving medication, you will need to arrange for someone to drive you home.  Occasionally, there is a little blood passed with the first bowel movement. Do not be concerned. FINDING OUT THE RESULTS OF YOUR TEST Not all test results are available during your visit. If your test results are  not back during the visit, make an appointment with your caregiver to find out the results. Do not assume everything is normal if you have not heard from your caregiver or the medical facility. It is important for you to follow up on all of your test results. HOME CARE INSTRUCTIONS   It is not unusual to pass moderate amounts of gas and experience mild abdominal cramping following the procedure. This is due to air being used to inflate your colon during the exam. Walking or a warm pack on your belly (abdomen) may help.  You may resume all normal meals and activities after sedatives and medicines have worn off.  Only take over-the-counter or prescription medicines for pain, discomfort, or fever as directed by your caregiver. Do not use aspirin or blood thinners if a biopsy was taken. Consult your caregiver for medicine usage if biopsies were taken. SEEK IMMEDIATE MEDICAL CARE IF:   You have a fever.  You pass large blood clots or fill a toilet with blood following the procedure. This may also occur 10 to 14 days following the procedure. This is more likely if a biopsy was taken.  You develop abdominal pain that keeps getting worse and cannot be relieved with medicine. Document Released: 02/23/2000 Document Revised: 05/20/2011 Document Reviewed: 09/28/2012 [employer] Patient Information 2014 [employer].  

## 2013-02-15 NOTE — Progress Notes (Signed)
   Subjective:    Patient ID: [name] [name]., male    DOB: 12/10/1965, 47 y.o.   MRN: [id number]  HPI This 47 y.o. male presents for evaluation of CPE.  He has hx of left knee DJD and is seeing Orthopedics.  He states his mother was diagnosed with early signs of colon cancer in the past. He denies any blood in stools.     Review of Systems No chest pain, SOB, HA, dizziness, vision change, N/V, diarrhea, constipation, dysuria, urinary urgency or frequency, myalgias, arthralgias or rash.     Objective:   Physical Exam  Vital signs noted  Well developed well nourished male.  HEENT - Head atraumatic Normocephalic                Eyes - PERRLA, Conjuctiva - clear Sclera- Clear EOMI                Ears - EAC's Wnl TM's Wnl Gross Hearing WNL                Nose - Nares patent                 Throat - oropharanx wnl Respiratory - Lungs CTA bilateral Cardiac - RRR S1 and S2 without murmur GI - Abdomen soft Nontender and bowel sounds active x 4 Extremities - No edema. Neuro - Grossly intact.      Assessment & Plan:  Screening - Plan: POCT CBC, CMP14+EGFR, Lipid panel, Vit D  25 hydroxy (rtn osteoporosis monitoring), Thyroid Panel With TSH, PSA, total and free  Routine general medical examination at a health care facility  Family hx of colon cancer - Recommend screening, GI consult ordered for screening colonoscopy.  Deatra Canter FNP

## 2013-02-16 ENCOUNTER — Other Ambulatory Visit: Payer: Self-pay | Admitting: Family Medicine

## 2013-02-16 LAB — CMP14+EGFR
ALT: 30 IU/L (ref 0–44)
AST: 23 IU/L (ref 0–40)
Albumin/Globulin Ratio: 2.5 (ref 1.1–2.5)
Albumin: 4.8 g/dL (ref 3.5–5.5)
Alkaline Phosphatase: 54 IU/L (ref 39–117)
BUN/Creatinine Ratio: 12 (ref 9–20)
BUN: 15 mg/dL (ref 6–24)
CO2: 24 mmol/L (ref 18–29)
Calcium: 9.5 mg/dL (ref 8.7–10.2)
Chloride: 102 mmol/L (ref 97–108)
Creatinine, Ser: 1.29 mg/dL — ABNORMAL HIGH (ref 0.76–1.27)
GFR calc Af Amer: 75 mL/min/{1.73_m2} (ref >59)
GFR calc non Af Amer: 65 mL/min/{1.73_m2} (ref >59)
Globulin, Total: 1.9 g/dL (ref 1.5–4.5)
Glucose: 96 mg/dL (ref 65–99)
Potassium: 4.6 mmol/L (ref 3.5–5.2)
Sodium: 142 mmol/L (ref 134–144)
Total Bilirubin: 0.5 mg/dL (ref 0.0–1.2)
Total Protein: 6.7 g/dL (ref 6.0–8.5)

## 2013-02-16 LAB — LIPID PANEL
Chol/HDL Ratio: 2.8 ratio units (ref 0.0–5.0)
Cholesterol, Total: 147 mg/dL (ref 100–199)
HDL: 53 mg/dL (ref >39)
LDL Calculated: 86 mg/dL (ref 0–99)
Triglycerides: 42 mg/dL (ref 0–149)
VLDL Cholesterol Cal: 8 mg/dL (ref 5–40)

## 2013-02-16 LAB — THYROID PANEL WITH TSH
Free Thyroxine Index: 1.6 (ref 1.2–4.9)
T3 Uptake Ratio: 32 % (ref 24–39)
T4, Total: 5 ug/dL (ref 4.5–12.0)
TSH: 1.4 u[IU]/mL (ref 0.450–4.500)

## 2013-02-16 LAB — VITAMIN D 25 HYDROXY (VIT D DEFICIENCY, FRACTURES): Vit D, 25-Hydroxy: 21.2 ng/mL — ABNORMAL LOW (ref 30.0–100.0)

## 2013-02-16 LAB — PSA, TOTAL AND FREE
PSA, Free Pct: 46.7 %
PSA, Free: 0.28 ng/mL
PSA: 0.6 ng/mL (ref 0.0–4.0)

## 2013-02-16 MED ORDER — VITAMIN D (ERGOCALCIFEROL) 1.25 MG (50000 UNIT) PO CAPS: 50000.0000 [IU] | ORAL_CAPSULE | ORAL | Status: DC

## 2013-03-30 ENCOUNTER — Telehealth: Payer: Self-pay | Admitting: Family Medicine

## 2013-04-02 ENCOUNTER — Other Ambulatory Visit: Payer: Self-pay | Admitting: Family Medicine

## 2013-04-02 DIAGNOSIS — M1710 Unilateral primary osteoarthritis, unspecified knee: Secondary | ICD-10-CM

## 2013-04-02 DIAGNOSIS — M179 Osteoarthritis of knee, unspecified: Secondary | ICD-10-CM

## 2013-04-02 NOTE — Telephone Encounter (Signed)
This patient says he is waiting on a referral to be made to [facility] from you.

## 2013-04-09 NOTE — Telephone Encounter (Signed)
Pt aware of appointment date/time Letter sent also 

## 2014-01-12 ENCOUNTER — Encounter: Payer: Self-pay | Admitting: Family Medicine

## 2014-01-12 ENCOUNTER — Ambulatory Visit (INDEPENDENT_AMBULATORY_CARE_PROVIDER_SITE_OTHER): Admitting: Family Medicine

## 2014-01-12 VITALS — BP 132/82 | HR 82 | Temp 98.6°F | Ht 72.5 in | Wt 257.0 lb

## 2014-01-12 DIAGNOSIS — G8929 Other chronic pain: Secondary | ICD-10-CM

## 2014-01-12 DIAGNOSIS — M25561 Pain in right knee: Secondary | ICD-10-CM

## 2014-01-14 NOTE — Progress Notes (Signed)
   Subjective:    Patient ID: Quincey J Guinea-BissauFrance Jr., male    DOB: 03/06/1966, 48 y.o.   MRN: [id number]  HPI C/o left knee pain and [facility] he cannot run due to pain in right knee. Review of Systems  Constitutional: Negative for fever.  HENT: Negative for ear pain.   Eyes: Negative for discharge.  Respiratory: Negative for cough.   Cardiovascular: Negative for chest pain.  Gastrointestinal: Negative for abdominal distention.  Endocrine: Negative for polyuria.  Genitourinary: Negative for difficulty urinating.  Musculoskeletal: Negative for gait problem and neck pain.  Skin: Negative for color change and rash.  Neurological: Negative for speech difficulty and headaches.  Psychiatric/Behavioral: Negative for agitation.       Objective:    BP 132/82 mmHg  Pulse 82  Temp(Src) 98.6 F (37 C) (Oral)  Ht 6' 0.5" (1.842 m)  Wt 257 lb (116.574 kg)  BMI 34.36 kg/m2 Physical Exam  Constitutional: He is oriented to person, place, and time. He appears well-developed and well-nourished.  HENT:  Head: Normocephalic and atraumatic.  Mouth/Throat: Oropharynx is clear and moist.  Eyes: Pupils are equal, round, and reactive to light.  Neck: Normal range of motion. Neck supple.  Cardiovascular: Normal rate and regular rhythm.   No murmur heard. Pulmonary/Chest: Effort normal and breath sounds normal.  Abdominal: Soft. Bowel sounds are normal. There is no tenderness.  Neurological: He is alert and oriented to person, place, and time.  Skin: Skin is warm and dry.  Psychiatric: He has a normal mood and affect.          Assessment & Plan:     ICD-9-CM ICD-10-CM   1. Knee pain, chronic, right 719.46 M25.561    338.29 G89.29    Note given for no running.  No Follow-up on file.  Deatra CanterWilliam J Oxford FNP

## 2014-03-09 ENCOUNTER — Telehealth: Payer: Self-pay | Admitting: Family Medicine

## 2014-12-18 ENCOUNTER — Emergency Department (HOSPITAL_COMMUNITY)
Admission: EM | Admit: 2014-12-18 | Discharge: 2014-12-18 | Disposition: A | Discharge status: Home / Self Care | Attending: Emergency Medicine | Admitting: Emergency Medicine

## 2014-12-18 ENCOUNTER — Encounter (HOSPITAL_COMMUNITY): Payer: Self-pay | Admitting: Emergency Medicine

## 2014-12-18 DIAGNOSIS — M79604 Pain in right leg: Secondary | ICD-10-CM | POA: Insufficient documentation

## 2014-12-18 DIAGNOSIS — M10071 Idiopathic gout, right ankle and foot: Secondary | ICD-10-CM | POA: Insufficient documentation

## 2014-12-18 DIAGNOSIS — M109 Gout, unspecified: Secondary | ICD-10-CM

## 2014-12-18 DIAGNOSIS — M25571 Pain in right ankle and joints of right foot: Secondary | ICD-10-CM | POA: Diagnosis present

## 2014-12-18 HISTORY — DX: Gout, unspecified: M10.9

## 2014-12-18 LAB — URIC ACID: URIC ACID, SERUM: 9.2 mg/dL — AB (ref 4.4–7.6)

## 2014-12-18 MED ORDER — COLCHICINE 0.6 MG PO TABS
0.6000 mg | ORAL_TABLET | Freq: Two times a day (BID) | ORAL | Status: DC
Start: 2014-12-18 — End: 2015-02-23

## 2014-12-18 MED ORDER — KETOROLAC TROMETHAMINE 60 MG/2ML IM SOLN
60.0000 mg | Freq: Once | INTRAMUSCULAR | Status: AC
  Administered 2014-12-18: 60 mg via INTRAMUSCULAR
  Filled 2014-12-18: qty 2

## 2014-12-18 MED ORDER — COLCHICINE 0.6 MG PO TABS
0.6000 mg | ORAL_TABLET | Freq: Once | ORAL | Status: AC
  Administered 2014-12-18: 0.6 mg via ORAL
  Filled 2014-12-18: qty 1

## 2014-12-18 MED ORDER — INDOMETHACIN 50 MG PO CAPS: ORAL_CAPSULE | ORAL | Status: DC

## 2014-12-18 NOTE — ED Provider Notes (Signed)
CSN: [id number]     Arrival date & time 12/18/14  0017 History  By signing my name below, I, [name] [name], attest that this documentation has been prepared under the direction and in the presence of [name] Albe, MD starting at 00:50 AM. Electronically Signed: Lyndel [name], ED Scribe. 12/18/2014. 1:04 AM.   Chief Complaint  Patient presents with  . Foot and Leg Pain    The history is provided by the patient. No language interpreter was used.   HPI Comments: [name] [name] is a 49 y.o. male, with a PMhx of gout with flare ups usually years apart, who presents to the Emergency Department complaining of worsening, constant, dull and aching pain in right ankle and right foot onset 3 days ago. He notes a PMhx of gout in left great toe the past 2 episodes and describes his current pain to be similar to past gout flares but worse. He is not currently prescribed medication for gout but has taken Uloric in the past. Pain is worse with flexion or extension of right foot. He denies injury, fall or trauma attributable to pain. He has taken no medications for this episode. He has found that avoiding green leafy vegetables such as collard greens helps to decrease the episodes. Pt followed by Dr. [name]. He denies smoking tobacco or EtOH consumption.   [facility] in [place]   Past Medical History  Diagnosis Date  . Medical history non-contributory   . Wears glasses   . Gout    Past Surgical History  Procedure Laterality Date  . Wisdom tooth extraction    . Knee arthroscopy with patellar tendon repair Left 09/03/2012    Procedure: LEFT KNEE ARTHROSCOPY, LOOSE BODY EXCISION, PATELLAR TENDON REPAIR, EXCISION TIBIAL TUBERCLE SPUR, CHONDROPLASTY PATELLA, EXCISION PLICA;  Surgeon: Loreta Ave, MD;  Location: [facility];  Service: Orthopedics;  Laterality: Left;   Family History  Problem Relation Age of Onset  . Cancer Mother     Precancerous colon polyps  . Diabetes Father    . Hypertension Father   . Heart attack Father    Social History  Substance Use Topics  . Smoking status: Never Smoker   . Smokeless tobacco: Never Used  . 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  Musculoskeletal: Positive for joint swelling ( right foot and ankle) and arthralgias ( right foot and ankle).  All other systems reviewed and are negative.  Allergies  Sulfa antibiotics  Home Medications   Prior to Admission medications   Medication Sig Start Date End Date Taking? Authorizing Provider  colchicine 0.6 MG tablet Take 1 tablet (0.6 mg total) by mouth 2 (two) times daily. 12/18/14   [name] Albe, MD  indomethacin (INDOCIN) 50 MG capsule Take 1 po QID x 2d then take 1 po TID x 3 d then 1 po BID x3d then 1 po QD x3d 12/18/14   [name] Albe, MD   BP 144/85 mmHg  Pulse 93  Temp(Src) 99.7 F (37.6 C) (Oral)  Resp 22  Ht  (1.854 m)  Wt 250 lb (113.399 kg)  BMI 32.99 kg/m2  SpO2 98%  Vital signs normal   Physical Exam  Constitutional: He is oriented to person, place, and time. He appears well-developed and well-nourished.  Non-toxic appearance. He does not appear ill. He appears distressed.  HENT:  Head: Normocephalic and atraumatic.  Right Ear: External ear normal.  Left Ear: External ear normal.  Nose: Nose normal. No mucosal edema or rhinorrhea.  Mouth/Throat: Mucous membranes are normal. No dental abscesses or uvula swelling.  Eyes: Conjunctivae and EOM are normal.  Neck: Normal range of motion and full passive range of motion without pain.  Pulmonary/Chest: Effort normal. No respiratory distress. He has no rhonchi. He exhibits no crepitus.  Abdominal: Normal appearance. There is no tenderness.  Musculoskeletal: Normal range of motion. He exhibits edema and tenderness.  RLE; moderate swelling over lateral malleolus of the right ankle with  tenderness to light touch, his foot is non-tender, he has good distal pulses, the skin is mildly warm to touch but not red. Moves all other extremities well.   Neurological: He is alert and oriented to person, place, and time. He has normal strength. No cranial nerve deficit.  Skin: Skin is warm, dry and intact. No rash noted. No erythema. No pallor.  Psychiatric: He has a normal mood and affect. His speech is normal and behavior is normal. His mood appears not anxious.  Nursing note and vitals reviewed.   ED Course  Procedures   Medications  ketorolac (TORADOL) injection 60 mg (60 mg Intramuscular Given 12/18/14 0108)  colchicine tablet 0.6 mg (0.6 mg Oral Given 12/18/14 0107)  colchicine tablet 0.6 mg (0.6 mg Oral Given 12/18/14 0308)    DIAGNOSTIC STUDIES: Oxygen Saturation is 98% on RA, normal by my interpretation.    COORDINATION OF CARE: 1:03 AM Discussed treatment plan with pt at bedside and pt agreed to plan. Will order uric acid level and Toradol injection with colchicine. Review of his prior labs showed no prior uric acid level in the chart.  Recheck at 2:50 AM patient is sleeping. He states his pain is better but still there. He was given a second dose of colchicine. He was given his test results and need to follow up with his primary care doctor. Patient states he has crutches he can use at home.  Labs Review Labs Reviewed  URIC ACID - Abnormal; Notable for the following:    Uric Acid, Serum 9.2 (*)    All other components within normal limits   Laboratory interpretation all normal except elevated uric acid    I have personally reviewed and evaluated these lab results as part of my medical decision-making.   MDM   Final diagnoses:  Acute gout of right ankle, unspecified cause   New Prescriptions   COLCHICINE 0.6 MG TABLET    Take 1 tablet (0.6 mg total) by mouth 2 (two) times daily.   INDOMETHACIN (INDOCIN) 50 MG CAPSULE    Take 1 po QID x 2d then take 1 po TID x  3 d then 1 po BID x3d then 1 po QD x3d    Plan discharge  [name] Albe, MD, FACEP   I personally performed the services described in this documentation, which was scribed in my presence. The recorded information has been reviewed and considered.  [name] Albe, MD, Concha Pyo, MD 12/18/14 [phone]

## 2014-12-18 NOTE — Discharge Instructions (Signed)
Elevate your foot. Try heat for comfort. Review the low purine diet again. Your uric acid level tonight was 9.2 which is elevated. You can discuss with Dr [name] if you need to be on other gout medications.    Low-Purine Diet Purines are compounds that affect the level of uric acid in your body. A low-purine diet is a diet that is low in purines. Eating a low-purine diet can prevent the level of uric acid in your body from getting too high and causing gout or kidney stones or both. WHAT DO I NEED TO KNOW ABOUT THIS DIET?  Choose low-purine foods. Examples of low-purine foods are listed in the next section.  Drink plenty of fluids, especially water. Fluids can help remove uric acid from your body. Try to drink 8-16 cups (1.9-3.8 L) a day.  Limit foods high in fat, especially saturated fat, as fat makes it harder for the body to get rid of uric acid. Foods high in saturated fat include pizza, cheese, ice cream, whole milk, fried foods, and gravies. Choose foods that are lower in fat and lean sources of protein. Use olive oil when cooking as it contains healthy fats that are not high in saturated fat.  Limit alcohol. Alcohol interferes with the elimination of uric acid from your body. If you are having a gout attack, avoid all alcohol.  Keep in mind that different people's bodies react differently to different foods. You will probably learn over time which foods do or do not affect you. If you discover that a food tends to cause your gout to flare up, avoid eating that food. You can more freely enjoy foods that do not cause problems. If you have any questions about a food item, talk to your dietitian or health care provider. WHICH FOODS ARE LOW, MODERATE, AND HIGH IN PURINES? The following is a list of foods that are low, moderate, and high in purines. You can eat any amount of the foods that are low in purines. You may be able to have small amounts of foods that are moderate in purines. Ask your health  care provider how much of a food moderate in purines you can have. Avoid foods high in purines. Grains  Foods low in purines: Enriched white bread, pasta, rice, cake, cornbread, popcorn.  Foods moderate in purines: Whole-grain breads and cereals, wheat germ, bran, oatmeal. Uncooked oatmeal. Dry wheat bran or wheat germ.  Foods high in purines: Pancakes, [place] toast, biscuits, muffins. Vegetables  Foods low in purines: All vegetables, except those that are moderate in purines.  Foods moderate in purines: Asparagus, cauliflower, spinach, mushrooms, green peas. Fruits  All fruits are low in purines. Meats and other Protein Foods  Foods low in purines: Eggs, nuts, peanut butter.  Foods moderate in purines: 80-90% lean beef, lamb, veal, pork, poultry, fish, eggs, peanut butter, nuts. Crab, lobster, oysters, and shrimp. Cooked dried beans, peas, and lentils.  Foods high in purines: Anchovies, sardines, herring, mussels, tuna, codfish, scallops, trout, and haddock. [name] [name]. Organ meats (such as liver or kidney). Tripe. Game meat. Goose. Sweetbreads. Dairy  All dairy foods are low in purines. Low-fat and fat-free dairy products are best because they are low in saturated fat. Beverages  Drinks low in purines: Water, carbonated beverages, tea, coffee, cocoa.  Drinks moderate in purines: Soft drinks and other drinks sweetened with high-fructose corn syrup. Juices. To find whether a food or drink is sweetened with high-fructose corn syrup, look at the ingredients list.  Drinks  high in purines: Alcoholic beverages (such as beer). Condiments  Foods low in purines: Salt, herbs, olives, pickles, relishes, vinegar.  Foods moderate in purines: Butter, margarine, oils, mayonnaise. Fats and Oils  Foods low in purines: All types, except gravies and sauces made with meat.  Foods high in purines: Gravies and sauces made with meat. Other Foods  Foods low in purines: Sugars, sweets, gelatin. Cake.  Soups made without meat.  Foods moderate in purines: Meat-based or fish-based soups, broths, or bouillons. Foods and drinks sweetened with high-fructose corn syrup.  Foods high in purines: High-fat desserts (such as ice cream, cookies, cakes, pies, doughnuts, and chocolate). Contact your dietitian for more information on foods that are not listed here.   This information is not intended to replace advice given to you by your health care provider. Make sure you discuss any questions you have with your health care provider.   Document Released: 06/22/2010 Document Revised: 03/02/2013 Document Reviewed: 02/01/2013 Elsevier Interactive Patient Education 2016 [employer].  Gout Gout is an inflammatory arthritis caused by a buildup of uric acid crystals in the joints. Uric acid is a chemical that is normally present in the blood. When the level of uric acid in the blood is too high it can form crystals that deposit in your joints and tissues. This causes joint redness, soreness, and swelling (inflammation). Repeat attacks are common. Over time, uric acid crystals can form into masses (tophi) near a joint, destroying bone and causing disfigurement. Gout is treatable and often preventable. CAUSES  The disease begins with elevated levels of uric acid in the blood. Uric acid is produced by your body when it breaks down a naturally found substance called purines. Certain foods you eat, such as meats and fish, contain high amounts of purines. Causes of an elevated uric acid level include:  Being passed down from parent to child (heredity).  Diseases that cause increased uric acid production (such as obesity, psoriasis, and certain cancers).  Excessive alcohol use.  Diet, especially diets rich in meat and seafood.  Medicines, including certain cancer-fighting medicines (chemotherapy), water pills (diuretics), and aspirin.  Chronic kidney disease. The kidneys are no longer able to remove uric acid  well.  Problems with metabolism. Conditions strongly associated with gout include:  Obesity.  High blood pressure.  High cholesterol.  Diabetes. Not everyone with elevated uric acid levels gets gout. It is not understood why some people get gout and others do not. Surgery, joint injury, and eating too much of certain foods are some of the factors that can lead to gout attacks. SYMPTOMS   An attack of gout comes on quickly. It causes intense pain with redness, swelling, and warmth in a joint.  Fever can occur.  Often, only one joint is involved. Certain joints are more commonly involved:  Base of the big toe.  Knee.  Ankle.  Wrist.  Finger. Without treatment, an attack usually goes away in a few days to weeks. Between attacks, you usually will not have symptoms, which is different from many other forms of arthritis. DIAGNOSIS  Your caregiver will suspect gout based on your symptoms and exam. In some cases, tests may be recommended. The tests may include:  Blood tests.  Urine tests.  X-rays.  Joint fluid exam. This exam requires a needle to remove fluid from the joint (arthrocentesis). Using a microscope, gout is confirmed when uric acid crystals are seen in the joint fluid. TREATMENT  There are two phases to gout treatment: treating  the sudden onset (acute) attack and preventing attacks (prophylaxis).  Treatment of an Acute Attack.  Medicines are used. These include anti-inflammatory medicines or steroid medicines.  An injection of steroid medicine into the affected joint is sometimes necessary.  The painful joint is rested. Movement can worsen the arthritis.  You may use warm or cold treatments on painful joints, depending which works best for you.  Treatment to Prevent Attacks.  If you suffer from frequent gout attacks, your caregiver may advise preventive medicine. These medicines are started after the acute attack subsides. These medicines either help your  kidneys eliminate uric acid from your body or decrease your uric acid production. You may need to stay on these medicines for a very long time.  The early phase of treatment with preventive medicine can be associated with an increase in acute gout attacks. For this reason, during the first few months of treatment, your caregiver may also advise you to take medicines usually used for acute gout treatment. Be sure you understand your caregiver's directions. Your caregiver may make several adjustments to your medicine dose before these medicines are effective.  Discuss dietary treatment with your caregiver or dietitian. Alcohol and drinks high in sugar and fructose and foods such as meat, poultry, and seafood can increase uric acid levels. Your caregiver or dietitian can advise you on drinks and foods that should be limited. HOME CARE INSTRUCTIONS   Do not take aspirin to relieve pain. This raises uric acid levels.  Only take over-the-counter or prescription medicines for pain, discomfort, or fever as directed by your caregiver.  Rest the joint as much as possible. When in bed, keep sheets and blankets off painful areas.  Keep the affected joint raised (elevated).  Apply warm or cold treatments to painful joints. Use of warm or cold treatments depends on which works best for you.  Use crutches if the painful joint is in your leg.  Drink enough fluids to keep your urine clear or pale yellow. This helps your body get rid of uric acid. Limit alcohol, sugary drinks, and fructose drinks.  Follow your dietary instructions. Pay careful attention to the amount of protein you eat. Your daily diet should emphasize fruits, vegetables, whole grains, and fat-free or low-fat milk products. Discuss the use of coffee, vitamin C, and cherries with your caregiver or dietitian. These may be helpful in lowering uric acid levels.  Maintain a healthy body weight. SEEK MEDICAL CARE IF:   You develop diarrhea,  vomiting, or any side effects from medicines.  You do not feel better in 24 hours, or you are getting worse. SEEK IMMEDIATE MEDICAL CARE IF:   Your joint becomes suddenly more tender, and you have chills or a fever. MAKE SURE YOU:   Understand these instructions.  Will watch your condition.  Will get help right away if you are not doing well or get worse.   This information is not intended to replace advice given to you by your health care provider. Make sure you discuss any questions you have with your health care provider.   Document Released: 02/23/2000 Document Revised: 03/18/2014 Document Reviewed: 10/09/2011 Elsevier Interactive Patient Education [employer]

## 2014-12-18 NOTE — ED Notes (Signed)
Pt states that on Thursday evening he started having a dull aching pain to L. Ankle. States he feels like "his joint got loose".

## 2015-01-05 ENCOUNTER — Encounter: Payer: Self-pay | Admitting: Family Medicine

## 2015-01-05 ENCOUNTER — Ambulatory Visit (INDEPENDENT_AMBULATORY_CARE_PROVIDER_SITE_OTHER): Admitting: Family Medicine

## 2015-01-05 VITALS — BP 160/96 | HR 65 | Temp 97.8°F | Ht 73.0 in | Wt 262.6 lb

## 2015-01-05 DIAGNOSIS — IMO0001 Reserved for inherently not codable concepts without codable children: Secondary | ICD-10-CM | POA: Insufficient documentation

## 2015-01-05 DIAGNOSIS — Z23 Encounter for immunization: Secondary | ICD-10-CM | POA: Diagnosis not present

## 2015-01-05 DIAGNOSIS — R030 Elevated blood-pressure reading, without diagnosis of hypertension: Secondary | ICD-10-CM | POA: Diagnosis not present

## 2015-01-05 DIAGNOSIS — M10079 Idiopathic gout, unspecified ankle and foot: Secondary | ICD-10-CM | POA: Diagnosis not present

## 2015-01-05 DIAGNOSIS — M109 Gout, unspecified: Secondary | ICD-10-CM | POA: Insufficient documentation

## 2015-01-05 DIAGNOSIS — G4730 Sleep apnea, unspecified: Secondary | ICD-10-CM | POA: Diagnosis not present

## 2015-01-05 MED ORDER — ALLOPURINOL 300 MG PO TABS: 300.0000 mg | ORAL_TABLET | Freq: Every day | ORAL | Status: DC

## 2015-01-05 NOTE — Progress Notes (Signed)
   Subjective:    Patient ID: [name] [name], male    DOB: 10/11/1965, 49 y.o.   MRN: [id number]  HPI 49 year old gentleman here to follow-up his recent gout attack. He presented to the emergency room with acute flareup of gout. This was the third episode. First flare was 5 years ago the next one was one year ago and then most recently 2 weeks ago. Uric acid level at the emergency room was 9.6. He was started on colchicine and Indocin which is unusual because either 1 by itself could treat the acute flare but nevertheless he has taken both medicines since that emergency room visit. Symptoms have pretty much resolved he has some minor discomfort in his toes at the same foot. There is no family history of gout.  We spent some time talking about his blood pressure which is elevated today. It was normal when he was seen in the emergency room 2 weeks ago. We have decided to monitor the pressure with a diary and recheck him in one month and decide whether or not to treat. There is a family history of hypertension.  He also complains of some fatigue and daytime tiredness and falling asleep easily. He has been told by his wife that he snores and has apneic episodes so there is a good possibility that his sleep apnea.  There are no active problems to display for this patient.  Outpatient Encounter Prescriptions as of 01/05/2015  Medication Sig  . colchicine 0.6 MG tablet Take 1 tablet (0.6 mg total) by mouth 2 (two) times daily.  . indomethacin (INDOCIN) 50 MG capsule Take 1 po QID x 2d then take 1 po TID x 3 d then 1 po BID x3d then 1 po QD x3d   No facility-administered encounter medications on file as of 01/05/2015.      Review of Systems  Constitutional: Positive for fatigue.  Respiratory: Negative.   Cardiovascular: Negative.   Gastrointestinal: Negative.   Neurological: Negative.        Objective:   Physical Exam  Constitutional: He is oriented to person, place, and time. He appears  well-developed and well-nourished.  Cardiovascular: Normal rate and regular rhythm.   Pulmonary/Chest: Effort normal and breath sounds normal.  Musculoskeletal: Normal range of motion. He exhibits no tenderness.  Neurological: He is alert and oriented to person, place, and time.          Assessment & Plan:  1. Sleep apnea Patient has symptoms clinically I think sleep study will confirm that he has sleep apnea. If this can be treated effectively this will help his blood pressure and fatigue - Ambulatory referral to Pulmonology  2. Encounter for immunization Flu shot given  3. Gout of foot, unspecified cause, unspecified chronicity, unspecified laterality With increased frequency of attacks and elevated uric acid will begin allopurinol and monitor uric acid levels. I have told him he does not longer need colchicine or Indocin but may take ibuprofen or Aleve for symptoms  4. Elevated blood pressure Blood pressure definitely elevated today but he had normal blood pressure 2 weeks ago in the emergency room. I would like him to monitor his pressure and repeat turn in one month. We will decide at that time if he needs treatment.  Frederica KusterStephen M Miller MD  Notice: Thisdictation was prepared with Dragon dictation along with smaller phrase technology. Any transcriptional errors that result from this process are unintentional and may not be corrected upon review

## 2015-01-16 ENCOUNTER — Encounter: Payer: Self-pay | Admitting: Internal Medicine

## 2015-01-16 ENCOUNTER — Ambulatory Visit (INDEPENDENT_AMBULATORY_CARE_PROVIDER_SITE_OTHER): Admitting: Internal Medicine

## 2015-01-16 VITALS — BP 140/80 | HR 74 | Ht 73.0 in | Wt 256.0 lb

## 2015-01-16 DIAGNOSIS — R030 Elevated blood-pressure reading, without diagnosis of hypertension: Secondary | ICD-10-CM

## 2015-01-16 DIAGNOSIS — G4733 Obstructive sleep apnea (adult) (pediatric): Secondary | ICD-10-CM | POA: Diagnosis not present

## 2015-01-16 DIAGNOSIS — IMO0001 Reserved for inherently not codable concepts without codable children: Secondary | ICD-10-CM

## 2015-01-16 NOTE — Assessment & Plan Note (Signed)
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 He needs a sleep study and an unattended home sleep test should be his needs. Plan-sleep study

## 2015-01-16 NOTE — Assessment & Plan Note (Signed)
Interaction between obstructive sleep apnea and hypertension was discussed, with associated potential cardiovascular complications.

## 2015-01-16 NOTE — Patient Instructions (Signed)
Order- schedule unattended Home Sleep Test     Dx OSA  Please call us in a week or so after the sleep test, if you haven't heard from us by then

## 2015-01-16 NOTE — Progress Notes (Signed)
01/16/2015-49 year old male never smoker Referred by Dr [name] [name]. Pt c/o loud snorning, witnessed apnea moments at night per wife, excessive tiredeness during the day and not feeling rested when waking up. Pt has never had sleep study. Pt had flu vaccine 12/2014 at PCP  Associated medical problems hypertension Works 10 hour days at a [employer] as active [employer]. No history of ENT surgery, heart or lung problems.  Prior to Admission medications   Medication Sig Start Date End Date Taking? Authorizing Provider  allopurinol (ZYLOPRIM) 300 MG tablet Take 1 tablet (300 mg total) by mouth at bedtime. 01/05/15  Yes Frederica Kuster, MD  colchicine 0.6 MG tablet Take 1 tablet (0.6 mg total) by mouth 2 (two) times daily. Patient not taking: Reported on 01/16/2015 12/18/14   [name], MD  indomethacin (INDOCIN) 50 MG capsule Take 1 po QID x 2d then take 1 po TID x 3 d then 1 po BID x3d then 1 po QD x3d Patient not taking: Reported on 01/16/2015 12/18/14   [name], MD   Past Medical History  Diagnosis Date  . Medical history non-contributory   . Wears glasses   . Gout    Past Surgical History  Procedure Laterality Date  . Wisdom tooth extraction    . Knee arthroscopy with patellar tendon repair Left 09/03/2012    Procedure: LEFT KNEE ARTHROSCOPY, LOOSE BODY EXCISION, PATELLAR TENDON REPAIR, EXCISION TIBIAL TUBERCLE SPUR, CHONDROPLASTY PATELLA, EXCISION PLICA;  Surgeon: Loreta Ave, MD;  Location: [facility];  Service: Orthopedics;  Laterality: Left;   Family History  Problem Relation Age of Onset  . Cancer Mother     Precancerous colon polyps  . Diabetes Father   . Hypertension Father   . Heart attack Father    Social History   Social History  . Marital Status: Married    Spouse Name: N/A  . Number of Children: N/A  . Years of Education: N/A   Occupational History  . Not on file.   Social History Main Topics  . Smoking status: Never Smoker   .  Smokeless tobacco: Never Used  . Alcohol Use: Yes     Comment: rare  . Drug Use: No  . Sexual Activity: Not on file   Other Topics Concern  . Not on file   Social History Narrative   ROS-see HPI   Negative unless "+" Constitutional:    weight loss, night sweats, fevers, chills, + fatigue, lassitude. HEENT:    headaches, difficulty swallowing, tooth/dental problems, sore throat,       sneezing, itching, ear ache, nasal congestion, post nasal drip, snoring CV:    chest pain, orthopnea, PND, swelling in lower extremities, anasarca,                                                      dizziness, palpitations Resp:   shortness of breath with exertion or at rest.                productive cough,   non-productive cough, coughing up of blood.              change in color of mucus.  wheezing.   Skin:    rash or lesions. GI:  No-   heartburn, indigestion, abdominal pain, nausea, vomiting, diarrhea,  change in bowel habits, loss of appetite GU: dysuria, change in color of urine, no urgency or frequency.   flank pain. MS:  + joint pain, stiffness, decreased range of motion, back pain. Neuro-     nothing unusual Psych:  change in mood or affect.  depression or anxiety.   memory loss.  OBJ- Physical Exam General- Alert, Oriented, Affect-appropriate, Distress- none acute, tall, fit-appearing Skin- rash-none, lesions- none, excoriation- none Lymphadenopathy- none Head- atraumatic            Eyes- Gross vision intact, PERRLA, conjunctivae and secretions clear            Ears- Hearing, canals-normal            Nose- Clear, no-Septal dev, mucus, polyps, erosion, perforation             Throat- Mallampati IV , mucosa clear , drainage- none, tonsils- atrophic Neck- flexible , trachea midline, no stridor , thyroid nl, carotid no bruit Chest - symmetrical excursion , unlabored           Heart/CV- RRR , no murmur , no gallop  , no rub, nl s1 s2                           - JVD- none ,  edema- none, stasis changes- none, varices- none           Lung- clear to P&A, wheeze- none, cough- none , dullness-none, rub- none           Chest wall-  Abd-  Br/ Gen/ Rectal- Not done, not indicated Extrem- cyanosis- none, clubbing, none, atrophy- none, strength- nl Neuro- grossly intact to observation

## 2015-01-23 ENCOUNTER — Institutional Professional Consult (permissible substitution): Admitting: Internal Medicine

## 2015-01-30 DIAGNOSIS — G4733 Obstructive sleep apnea (adult) (pediatric): Secondary | ICD-10-CM | POA: Diagnosis not present

## 2015-01-31 ENCOUNTER — Institutional Professional Consult (permissible substitution): Admitting: Internal Medicine

## 2015-01-31 ENCOUNTER — Other Ambulatory Visit: Payer: Self-pay | Admitting: *Deleted

## 2015-01-31 DIAGNOSIS — G4733 Obstructive sleep apnea (adult) (pediatric): Secondary | ICD-10-CM | POA: Diagnosis not present

## 2015-02-03 ENCOUNTER — Telehealth: Payer: Self-pay | Admitting: *Deleted

## 2015-02-03 NOTE — Telephone Encounter (Signed)
Patient had HST done on 01/27/15 Needs follow up appointment next week. Where can we place patient on schedule?

## 2015-02-03 NOTE — Telephone Encounter (Signed)
-----   Message from Lilian KapurAnita S Walker sent at 01/27/2015  1:24 PM EST ----- Regarding: HST Katie this patient of Dr. Roxy CedarYoung's is coming in today to pick up HST do you want to work him in for a Follow up appt  Thanks, Synetta FailAnita

## 2015-02-07 ENCOUNTER — Ambulatory Visit (INDEPENDENT_AMBULATORY_CARE_PROVIDER_SITE_OTHER): Admitting: Family Medicine

## 2015-02-07 ENCOUNTER — Encounter: Payer: Self-pay | Admitting: Family Medicine

## 2015-02-07 VITALS — BP 138/87 | HR 75 | Temp 98.3°F | Ht 73.0 in | Wt 260.4 lb

## 2015-02-07 DIAGNOSIS — R030 Elevated blood-pressure reading, without diagnosis of hypertension: Secondary | ICD-10-CM

## 2015-02-07 DIAGNOSIS — IMO0001 Reserved for inherently not codable concepts without codable children: Secondary | ICD-10-CM

## 2015-02-07 NOTE — Telephone Encounter (Signed)
There are openings on the schedule tomorrow for CY. Please have patient come in then. Thanks.

## 2015-02-07 NOTE — Progress Notes (Signed)
   Subjective:    Patient ID: [name] [name] J [place], male    DOB: 06/16/1965, 49 y.o.   MRN: [id number]  HPI 49-year-old gentleman who is here in follow-up of his blood pressure. 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 He recently had a sleep study, the results of which are pending. It is possible that if he does have sleep apnea this may lower his blood pressure is treated. If he does not have sleep apnea I have asked him to monitor his pressure and call us if it is consistently above 135/85.  Patient Active Problem List   Diagnosis Date Noted  . Obstructive sleep apnea 01/16/2015  . Gout 01/05/2015  . Elevated blood pressure 01/05/2015   Outpatient Encounter Prescriptions as of 02/07/2015  Medication Sig  . allopurinol (ZYLOPRIM) 300 MG tablet Take 1 tablet (300 mg total) by mouth at bedtime.  . colchicine 0.6 MG tablet Take 1 tablet (0.6 mg total) by mouth 2 (two) times daily. (Patient not taking: Reported on 01/16/2015)  . indomethacin (INDOCIN) 50 MG capsule Take 1 po QID x 2d then take 1 po TID x 3 d then 1 po BID x3d then 1 po QD x3d (Patient not taking: Reported on 01/16/2015)   No facility-administered encounter medications on file as of 02/07/2015.      Review of Systems  Constitutional: Negative.   HENT: Negative.   Eyes: Negative.   Respiratory: Negative.  Negative for shortness of breath.   Cardiovascular: Negative.  Negative for chest pain and leg swelling.  Gastrointestinal: Negative.   Genitourinary: Negative.   Musculoskeletal: Negative.   Skin: Negative.   Neurological: Negative.   Psychiatric/Behavioral: Negative.   All other systems reviewed and are negative.      Objective:   Physical Exam  Constitutional: He is oriented to person, place, and time. He appears well-developed and well-nourished.  Cardiovascular: Normal rate and regular rhythm.   Pulmonary/Chest: Effort normal and breath sounds normal.    Neurological: He is alert and oriented to person, place, and time.  Psychiatric: He has a normal mood and affect.          Assessment & Plan:  1. Elevated blood pressure Blood pressure today is 138/87. I am inclined to withhold treatment pending results of the sleep study and treatment of sleep apnea. Her pressure is still elevated consider use of low-dose diuretic or ACE inhibitor. He does have a history of gout and takes allopurinol as a maintenance medicine  Frederica KusterStephen M Ebonie Westerlund MD

## 2015-02-07 NOTE — Telephone Encounter (Signed)
Called spoke with pt. He could not come in tomorrow. appt scheduled for 12/15 at 2:45. Nothing further needed

## 2015-02-23 ENCOUNTER — Encounter: Payer: Self-pay | Admitting: Internal Medicine

## 2015-02-23 ENCOUNTER — Ambulatory Visit (INDEPENDENT_AMBULATORY_CARE_PROVIDER_SITE_OTHER): Admitting: Internal Medicine

## 2015-02-23 VITALS — BP 138/98 | HR 69 | Ht 73.0 in | Wt 260.8 lb

## 2015-02-23 DIAGNOSIS — G4733 Obstructive sleep apnea (adult) (pediatric): Secondary | ICD-10-CM | POA: Diagnosis not present

## 2015-02-23 DIAGNOSIS — R030 Elevated blood-pressure reading, without diagnosis of hypertension: Secondary | ICD-10-CM

## 2015-02-23 DIAGNOSIS — IMO0001 Reserved for inherently not codable concepts without codable children: Secondary | ICD-10-CM

## 2015-02-23 NOTE — Assessment & Plan Note (Signed)
We discussed treatment options and he chooses CPAP. Plan-begin with CPAP auto titration

## 2015-02-23 NOTE — Progress Notes (Signed)
01/16/2015-49 year old male never smoker Referred by Dr Jacalyn LefevreStephen [name]. Pt c/o loud snorning, witnessed apnea moments at night per wife, excessive tiredeness during the day and not feeling rested when waking up. Pt has never had sleep study. Pt had flu vaccine 12/2014 at PCP  Associated medical problems hypertension Works 10 hour days at a [employer] as active [employer]. No history of ENT surgery, heart or lung problems.  02/23/2015-49 year old male never smoker followed for  OSA complicated by HBP Unattended home sleep test 01/30/15- mild OSA AHI 9.6 per hour with desaturation to 83%, body weight 256 pounds Follows for: OSA. Pt is here for HST results.  Reviewed again the medical issues and basic physiology of OSA with basic treatment options. He chooses to go forward with CPAP.  ROS-see HPI   Negative unless "+" Constitutional:    weight loss, night sweats, fevers, chills, + fatigue, lassitude. HEENT:    headaches, difficulty swallowing, tooth/dental problems, sore throat,       sneezing, itching, ear ache, nasal congestion, post nasal drip, snoring CV:    chest pain, orthopnea, PND, swelling in lower extremities, anasarca,                                                      dizziness, palpitations Resp:   shortness of breath with exertion or at rest.                productive cough,   non-productive cough, coughing up of blood.              change in color of mucus.  wheezing.   Skin:    rash or lesions. GI:  No-   heartburn, indigestion, abdominal pain, nausea, vomiting,  GU:  MS:  + joint pain, stiffness,  Neuro-     nothing unusual Psych:  change in mood or affect.  depression or anxiety.   memory loss.  OBJ- Physical Exam General- Alert, Oriented, Affect-appropriate, Distress- none acute, tall, fit-appearing Skin- rash-none, lesions- none, excoriation- none Lymphadenopathy- none Head- atraumatic            Eyes- Gross vision intact, PERRLA, conjunctivae and  secretions clear            Ears- Hearing, canals-normal            Nose- Clear, no-Septal dev, mucus, polyps, erosion, perforation             Throat- Mallampati IV , mucosa clear , drainage- none, tonsils- atrophic Neck- flexible , trachea midline, no stridor , thyroid nl, carotid no bruit Chest - symmetrical excursion , unlabored           Heart/CV- RRR , no murmur , no gallop  , no rub, nl s1 s2                           - JVD- none , edema- none, stasis changes- none, varices- none           Lung- clear to P&A, wheeze- none, cough- none , dullness-none, rub- none           Chest wall-  Abd-  Br/ Gen/ Rectal- Not done, not indicated Extrem- cyanosis- none, clubbing, none, atrophy- none, strength- nl Neuro- grossly intact to observation

## 2015-02-23 NOTE — Patient Instructions (Signed)
Order-new DME new CPAP auto 5-20, mask of choice, humidifier, supplies, [facility], DX OSA  Please call as needed

## 2015-02-23 NOTE — Assessment & Plan Note (Signed)
Discussed potential interaction between OSA and HBP

## 2015-03-03 ENCOUNTER — Telehealth: Payer: Self-pay | Admitting: Internal Medicine

## 2015-03-03 NOTE — Telephone Encounter (Signed)
Spoke with pt. Advised him that if needs information about his CPAP machine he will need to contact his DME. He agreed and verbalized understanding. Nothing further was needed.

## 2015-04-28 ENCOUNTER — Ambulatory Visit: Admitting: Internal Medicine

## 2015-05-30 ENCOUNTER — Other Ambulatory Visit: Payer: Self-pay

## 2015-05-30 MED ORDER — ALLOPURINOL 300 MG PO TABS: 300.0000 mg | ORAL_TABLET | Freq: Every day | ORAL | Status: DC

## 2015-06-05 ENCOUNTER — Ambulatory Visit: Admitting: Internal Medicine

## 2015-06-13 ENCOUNTER — Ambulatory Visit (INDEPENDENT_AMBULATORY_CARE_PROVIDER_SITE_OTHER): Admitting: Internal Medicine

## 2015-06-13 ENCOUNTER — Encounter: Payer: Self-pay | Admitting: Internal Medicine

## 2015-06-13 VITALS — BP 128/82 | HR 68 | Ht 73.0 in | Wt 260.2 lb

## 2015-06-13 DIAGNOSIS — G4733 Obstructive sleep apnea (adult) (pediatric): Secondary | ICD-10-CM

## 2015-06-13 MED ORDER — ESZOPICLONE 2 MG PO TABS: 2.0000 mg | ORAL_TABLET | Freq: Every evening | ORAL | Status: DC | PRN

## 2015-06-13 NOTE — Progress Notes (Signed)
01/16/2015-50 year old male never smoker Referred by Dr Jacalyn LefevreStephen Miller. Pt c/o loud snorning, witnessed apnea moments at night per wife, excessive tiredeness during the day and not feeling rested when waking up. Pt has never had sleep study. Pt had flu vaccine 12/2014 at PCP  Associated medical problems hypertension Works 10 hour days at a [employer] as active [employer] and [employer]. No history of ENT surgery, heart or lung problems.  02/23/2015-50 year old male never smoker followed for  OSA complicated by HBP Unattended home sleep test 01/30/15- mild OSA AHI 9.6 per hour with desaturation to 83%, body weight 256 pounds Follows for: OSA. Pt is here for HST results.  Reviewed again the medical issues and basic physiology of OSA with basic treatment options. He chooses to go forward with CPAP.  06/13/2015-50 year old male never smoker followed for OSA complicated by HBP,  CPAP      FOLLOWS FOR: [place] [facility] [place] [facility]. Pt wears CPAP alomost every night for about 5-6 hours. Pt is unsure if pressure is enough; also moves alot in sleep and breaks seal in mask-tightens mask up then wakes up to loosen them again.    ROS-see HPI   Negative unless "+" Constitutional:    weight loss, night sweats, fevers, chills, + fatigue, lassitude. HEENT:    headaches, difficulty swallowing, tooth/dental problems, sore throat,       sneezing, itching, ear ache, nasal congestion, post nasal drip, snoring CV:    chest pain, orthopnea, PND, swelling in lower extremities, anasarca,                                                      dizziness, palpitations Resp:   shortness of breath with exertion or at rest.                productive cough,   non-productive cough, coughing up of blood.              change in color of mucus.  wheezing.   Skin:    rash or lesions. GI:  No-   heartburn, indigestion, abdominal pain, nausea, vomiting,  GU:  MS:  + joint pain, stiffness,  Neuro-      nothing unusual Psych:  change in mood or affect.  depression or anxiety.   memory loss.  OBJ- Physical Exam General- Alert, Oriented, Affect-appropriate, Distress- none acute, tall, fit-appearing Skin- rash-none, lesions- none, excoriation- none Lymphadenopathy- none Head- atraumatic            Eyes- Gross vision intact, PERRLA, conjunctivae and secretions clear            Ears- Hearing, canals-normal            Nose- Clear, no-Septal dev, mucus, polyps, erosion, perforation             Throat- Mallampati IV , mucosa clear , drainage- none, tonsils- atrophic Neck- flexible , trachea midline, no stridor , thyroid nl, carotid no bruit Chest - symmetrical excursion , unlabored           Heart/CV- RRR , no murmur , no gallop  , no rub, nl s1 s2                           - JVD- none , edema- none,  stasis changes- none, varices- none           Lung- clear to P&A, wheeze- none, cough- none , dullness-none, rub- none           Chest wall-  Abd-  Br/ Gen/ Rectal- Not done, not indicated Extrem- cyanosis- none, clubbing, none, atrophy- none, strength- nl Neuro- grossly intact to observation

## 2015-06-13 NOTE — Patient Instructions (Signed)
Order- DME [name]     Change CPAP autpo range to 10-20      Dx OSA  Script printed for lunesta 2 mg   See if occasional use at bedtime helps you sleep more restfully when needed

## 2015-10-23 ENCOUNTER — Encounter: Payer: Self-pay | Admitting: Internal Medicine

## 2015-10-23 ENCOUNTER — Ambulatory Visit (INDEPENDENT_AMBULATORY_CARE_PROVIDER_SITE_OTHER): Admitting: Internal Medicine

## 2015-10-23 VITALS — BP 136/78 | HR 73 | Ht 73.0 in | Wt 251.2 lb

## 2015-10-23 DIAGNOSIS — G4733 Obstructive sleep apnea (adult) (pediatric): Secondary | ICD-10-CM

## 2015-10-23 DIAGNOSIS — G4700 Insomnia, unspecified: Secondary | ICD-10-CM | POA: Diagnosis not present

## 2015-10-23 MED ORDER — ESZOPICLONE 2 MG PO TABS: 2.0000 mg | ORAL_TABLET | Freq: Every evening | ORAL | 3 refills | Status: DC | PRN

## 2015-10-23 MED ORDER — ESZOPICLONE 2 MG PO TABS: 2.0000 mg | ORAL_TABLET | Freq: Every evening | ORAL | 5 refills | Status: DC | PRN

## 2015-10-23 NOTE — Assessment & Plan Note (Signed)
Difficulty initiating and maintaining sleep. He says he often only gets 4 hours of sleep but I explained again that CPAP at work if he doesn't use it and his body will need more than 4 hours of sleep. Plan-[facility]ill Lunesta [facility]

## 2015-10-23 NOTE — Assessment & Plan Note (Signed)
With auto pressure 10-20 he says CPAP is more comfortable. He explains that [employer] maneuvers this summer prevented use of CPAP. I'm not sure compliance will be easy for him so I again discussed the alternative of oral appliances. Plan-continue trying to use CPAP for full benefit. Referral for consideration of oral appliance.

## 2015-10-23 NOTE — Progress Notes (Signed)
01/16/2015-50 year old male never smoker Referred by Dr Jacalyn LefevreStephen Miller. Pt c/o loud snorning, witnessed apnea moments at night per wife, excessive tiredeness during the day and not feeling rested when waking up. Pt has never had sleep study. Pt had flu vaccine 12/2014 at PCP  Associated medical problems hypertension Works 10 hour days at a [employer] as active [employer] and [employer]. No history of ENT surgery, heart or lung problems.  02/23/2015-50 year old male never smoker followed for  OSA complicated by HBP Unattended home sleep test 01/30/15- mild OSA AHI 9.6 per hour with desaturation to 83%, body weight 256 pounds Follows for: OSA. Pt is here for HST results.  Reviewed again the medical issues and basic physiology of OSA with basic treatment options. He chooses to go forward with CPAP.  06/13/2015-50 year old male never smoker followed for OSA complicated by HBP,  CPAP      FOLLOWS FOR: D[place] [facility] [place] [facility]. Pt wears CPAP alomost every night for about 5-6 hours. Pt is unsure if pressure is enough; also moves alot in sleep and breaks seal in mask-tightens mask up then wakes up to loosen them again.  10/23/2015-50 year old male never smoker followed for OSA, complicated by HBP CPAP auto 10-20/ [facility] We gave Lunesta 2 mg last visit for insomnia to help with CPAP compliance FOLLOW FOR: Patient was out in the woods camping for a while and couldn't take his CPAP with him.  does well with pressure, mask leaks once in a while.  no other concerns. needs 90 day refill on Lunesta printed so he can get the medication through [employer] benefits. He didn't take CPAP with him on summer [employer] maneuvers. Download records only one day of use with good control, AHI 3.1/hour. CPAP does stop his snoring. We talked again about comfort issues and utilization goals.  ROS-see HPI   Negative unless "+" Constitutional:    weight loss, night sweats, fevers, chills, +  fatigue, lassitude. HEENT:    headaches, difficulty swallowing, tooth/dental problems, sore throat,       sneezing, itching, ear ache, nasal congestion, post nasal drip, snoring CV:    chest pain, orthopnea, PND, swelling in lower extremities, anasarca,                                                      dizziness, palpitations Resp:   shortness of breath with exertion or at rest.                productive cough,   non-productive cough, coughing up of blood.              change in color of mucus.  wheezing.   Skin:    rash or lesions. GI:  No-   heartburn, indigestion, abdominal pain, nausea, vomiting,  GU:  MS:  + joint pain, stiffness,  Neuro-     nothing unusual Psych:  change in mood or affect.  depression or anxiety.   memory loss.  OBJ- Physical Exam General- Alert, Oriented, Affect-appropriate, Distress- none acute, tall, fit-appearing Skin- rash-none, lesions- none, excoriation- none Lymphadenopathy- none Head- atraumatic            Eyes- Gross vision intact, PERRLA, conjunctivae and secretions clear            Ears- Hearing, canals-normal  Nose- Clear, no-Septal dev, mucus, polyps, erosion, perforation             Throat- Mallampati IV , mucosa clear , drainage- none, tonsils- atrophic Neck- flexible , trachea midline, no stridor , thyroid nl, carotid no bruit Chest - symmetrical excursion , unlabored           Heart/CV- RRR , no murmur , no gallop  , no rub, nl s1 s2                           - JVD- none , edema- none, stasis changes- none, varices- none           Lung- clear to P&A, wheeze- none, cough- none , dullness-none, rub- none           Chest wall-  Abd-  Br/ Gen/ Rectal- Not done, not indicated Extrem- cyanosis- none, clubbing, none, atrophy- none, strength- nl Neuro- grossly intact to observation

## 2015-10-23 NOTE — Patient Instructions (Addendum)
Script printed Lunesta- 30 day- for local drug store  Script printed 90 day- for you to send to mail order pharmacy  Order- referral to orthodontist Dr Myrtis SerKatz    Consider oral appliance for OSA  Our goal with CPAP at a minimum is to use it 4 hours per night, for at least 70% of nights. Ideally we would like to wear it all night every night and any time you sleep.

## 2015-11-14 ENCOUNTER — Encounter: Payer: Self-pay | Admitting: Family Medicine

## 2015-11-14 ENCOUNTER — Ambulatory Visit (INDEPENDENT_AMBULATORY_CARE_PROVIDER_SITE_OTHER): Admitting: Family Medicine

## 2015-11-14 VITALS — BP 115/71 | HR 80 | Temp 98.6°F | Ht 73.0 in | Wt 248.6 lb

## 2015-11-14 DIAGNOSIS — M109 Gout, unspecified: Secondary | ICD-10-CM

## 2015-11-14 DIAGNOSIS — G4700 Insomnia, unspecified: Secondary | ICD-10-CM

## 2015-11-14 DIAGNOSIS — M10079 Idiopathic gout, unspecified ankle and foot: Secondary | ICD-10-CM | POA: Diagnosis not present

## 2015-11-14 NOTE — Progress Notes (Signed)
   Subjective:    Patient ID: [name] [name] [place], male    DOB: 12/24/1965, 50 y.o.   MRN: [id number]  HPI 50 year old gentleman with insomnia and history of gout. Regarding the latter. He has not had a gout flareup since going on allopurinol that he was only having gouty attacks at one per year. This raises the question in my mind of necessity of allopurinol. He takes the [place] almost every night. He says that no one ever told him that he should do anything but that but I explained the tolerance affect and ceiling effects and those would not be as likely to occur if he would only take the medicine as needed.  Patient Active Problem List   Diagnosis Date Noted  . Insomnia 10/23/2015  . Obstructive sleep apnea 01/16/2015  . Gout 01/05/2015  . Elevated blood pressure 01/05/2015   Outpatient Encounter Prescriptions as of 11/14/2015  Medication Sig  . allopurinol (ZYLOPRIM) 300 MG tablet Take 1 tablet (300 mg total) by mouth at bedtime.  . eszopiclone (LUNESTA) 2 MG TABS tablet Take 1 tablet (2 mg total) by mouth at bedtime as needed for sleep. Take immediately before bedtime  . [DISCONTINUED] eszopiclone (LUNESTA) 2 MG TABS tablet Take 1 tablet (2 mg total) by mouth at bedtime as needed for sleep. Take immediately before bedtime   No facility-administered encounter medications on file as of 11/14/2015.       Review of Systems  Constitutional: Negative.   HENT: Negative.   Eyes: Negative.   Respiratory: Negative.  Negative for shortness of breath.   Cardiovascular: Negative.  Negative for chest pain and leg swelling.  Gastrointestinal: Negative.   Genitourinary: Negative.   Musculoskeletal: Negative.   Skin: Negative.   Neurological: Negative.   Psychiatric/Behavioral: Positive for sleep disturbance.  All other systems reviewed and are negative.      Objective:   Physical Exam  Constitutional: He is oriented to person, place, and time. He appears well-developed and well-nourished.    Cardiovascular: Normal rate and regular rhythm.   Pulmonary/Chest: Effort normal and breath sounds normal.  Neurological: He is alert and oriented to person, place, and time.  Psychiatric: He has a normal mood and affect. His behavior is normal.   BP 115/71   Pulse 80   Temp 98.6 F (37 C) (Oral)   Ht 6\' 1"  (1.854 m)   Wt 248 lb 9.6 oz (112.8 kg)   BMI 32.80 kg/m         Assessment & Plan:  1. Insomnia Refill Lunesta with cautions as described in history of present illness  2. Gout of foot, unspecified cause, unspecified chronicity, unspecified laterality   Frederica KusterStephen M Devontae Casasola MDFeel allopurinol with advice as described above

## 2015-11-15 ENCOUNTER — Ambulatory Visit: Admitting: Family Medicine

## 2015-12-20 ENCOUNTER — Other Ambulatory Visit: Payer: Self-pay

## 2015-12-20 MED ORDER — ALLOPURINOL 300 MG PO TABS: 300.0000 mg | ORAL_TABLET | Freq: Every day | ORAL | 0 refills | Status: DC

## 2016-04-15 ENCOUNTER — Ambulatory Visit (INDEPENDENT_AMBULATORY_CARE_PROVIDER_SITE_OTHER): Admitting: Internal Medicine

## 2016-04-15 ENCOUNTER — Encounter: Payer: Self-pay | Admitting: Internal Medicine

## 2016-04-15 DIAGNOSIS — G4733 Obstructive sleep apnea (adult) (pediatric): Secondary | ICD-10-CM | POA: Diagnosis not present

## 2016-04-15 DIAGNOSIS — F5101 Primary insomnia: Secondary | ICD-10-CM | POA: Diagnosis not present

## 2016-04-15 NOTE — Patient Instructions (Signed)
You can continue with your oral appliance instead of CPAP  Ok to continue with Lunesta 2 mg when needed. We can increase strength to the 3 mg form if you let us know you would like to try that.   Please call as needed

## 2016-04-15 NOTE — Progress Notes (Signed)
HPI  male never smoker followed for OSA, complicated by HBP Unattended home sleep test 01/30/15- mild OSA AHI 9.6 per hour with desaturation to 83%, body weight 256 pounds  ------------------------------------------------------------------------------------  10/23/2015-51 year old male never smoker followed for OSA, complicated by HBP CPAP auto 10-20/ [facility] We gave Lunesta 2 mg last visit for insomnia to help with CPAP compliance FOLLOW FOR: Patient was out in the woods camping for a while and couldn't take his CPAP with him.  does well with pressure, mask leaks once in a while.  no other concerns. needs 90 day refill on Lunesta printed so he can get the medication through [employer]. He didn't take CPAP with him on summer [employer] maneuvers. Download records only one day of use with good control, AHI 3.1/hour. CPAP does stop his snoring. We talked again about comfort issues and utilization goals.  04/16/2811-51 year old male never smoker followed for OSA/ oral appliance , insomnia, complicated by HBP CPAP auto 10-20/ [facility]- replaced with oral appliance 6 month follow up for OSA. Breathing has been ok since last visit.  Compliance download has no recorded data at this visit. He states he is now using his fitted oral appliance instead of CPAP. Wife works night shift and is not usually home to report on snoring. He usually sleeps fairly well and feels reasonably rested during the day. Still uses Lunesta many nights, but may still wake during the night and be awake for 2 or 3 hours before regaining sleep. Busy thoughts. He is not aware of breathing issues during his sleep, limb movement disturbance or other parasomnias.   ROS-see HPI   Negative unless "+" Constitutional:    weight loss, night sweats, fevers, chills,  fatigue, lassitude. HEENT:    headaches, difficulty swallowing, tooth/dental problems, sore throat,       sneezing, itching, ear ache, nasal congestion, post nasal  drip, snoring CV:    chest pain, orthopnea, PND, swelling in lower extremities, anasarca,                                                      dizziness, palpitations Resp:   shortness of breath with exertion or at rest.                productive cough,   non-productive cough, coughing up of blood.              change in color of mucus.  wheezing.   Skin:    rash or lesions. GI:  No-   heartburn, indigestion, abdominal pain, nausea, vomiting,  GU:  MS:  + joint pain, stiffness,  Neuro-     nothing unusual Psych:  change in mood or affect.  depression or anxiety.   memory loss.  OBJ- Physical Exam General- Alert, Oriented, Affect-rather reserved, Distress- none acute, tall, muscular Skin- rash-none, lesions- none, excoriation- none Lymphadenopathy- none Head- atraumatic            Eyes- Gross vision intact, PERRLA, conjunctivae and secretions clear            Ears- Hearing, canals-normal            Nose- Clear, no-Septal dev, mucus, polyps, erosion, perforation             Throat- Mallampati IV , mucosa clear , drainage- none, tonsils- atrophic Neck-  flexible , trachea midline, no stridor , thyroid nl, carotid no bruit Chest - symmetrical excursion , unlabored           Heart/CV- RRR , no murmur , no gallop  , no rub, nl s1 s2                           - JVD- none , edema- none, stasis changes- none, varices- none           Lung- clear to P&A, wheeze- none, cough- none , dullness-none, rub- none           Chest wall-  Abd-  Br/ Gen/ Rectal- Not done, not indicated Extrem- cyanosis- none, clubbing, none, atrophy- none, strength- nl Neuro- grossly intact to observation

## 2016-04-15 NOTE — Assessment & Plan Note (Signed)
We discussed sleep quality, bedroom, sleep hygiene. He is a big man and may do better with increase to 3 mg lunesta in the future. For now we will continue 2 mg as discussed.  He may be a good candidate for CBT eventually as well, if willing.

## 2016-04-15 NOTE — Assessment & Plan Note (Signed)
We discussed use of oral appliance instead of CPAP, and his guidelines of reduced snoring and improved sleep quality/ daytime sleepiness. I think he will use it reliably. He is not obese. Encouraged to minimize sleep on back.

## 2016-06-17 ENCOUNTER — Ambulatory Visit (INDEPENDENT_AMBULATORY_CARE_PROVIDER_SITE_OTHER): Admitting: Family Medicine

## 2016-06-17 ENCOUNTER — Encounter: Payer: Self-pay | Admitting: Family Medicine

## 2016-06-17 VITALS — BP 131/88 | HR 79 | Temp 98.1°F | Ht 73.0 in | Wt 265.0 lb

## 2016-06-17 DIAGNOSIS — N6081 Other benign mammary dysplasias of right breast: Secondary | ICD-10-CM

## 2016-06-17 NOTE — Progress Notes (Signed)
   Subjective:  Patient ID: [name] [name], male    DOB: 1965-08-11  Age: 51 y.o. MRN: [id number]  CC: Cyst (pt here today c/o "knot on right side of chest")   HPI [name] [name] presents for Concern about and not [name] [name] found under the skin on the right side of [name] [name] chest several days ago. It is not painful. It has not grown in the short time since it was first noted.  History [name] [name] has a past medical history of Gout; Medical history non-contributory; and Wears glasses.   [name] [name] has a past surgical history that includes Wisdom tooth extraction and Knee arthroscopy with patellar tendon repair (Left, 09/03/2012).   [name] [name] family history includes Cancer in [name] [name] mother; Diabetes in [name] [name] father; Heart attack in [name] [name] father; Hypertension in [name] [name] father.[name] [name] reports that [name] [name] has never smoked. [name] [name] has never used smokeless tobacco. [name] [name] reports that [name] [name] drinks alcohol. [name] [name] reports that [name] [name] does not use drugs.  Current Outpatient Prescriptions on File Prior to Visit  Medication Sig Dispense Refill  . allopurinol (ZYLOPRIM) 300 MG tablet Take 1 tablet (300 mg total) by mouth at bedtime. 90 tablet 0  . eszopiclone (LUNESTA) 2 MG TABS tablet Take 1 tablet (2 mg total) by mouth at bedtime as needed for sleep. Take immediately before bedtime 30 tablet 5   No current facility-administered medications on file prior to visit.     ROS Review of Systems  Constitutional: Negative.     Objective:  BP 131/88   Pulse 79   Temp 98.1 F (36.7 C) (Oral)   Ht  (1.854 m)   Wt 265 lb (120.2 kg)   BMI 34.96 kg/m   Physical Exam  Constitutional: [name] [name] is oriented to person, place, and time. [name] [name] appears well-developed and well-nourished. No distress.  HENT:  Head: Normocephalic and atraumatic.  Pulmonary/Chest: Effort normal.  Musculoskeletal: Normal range of motion.  Neurological: [name] [name] is alert and oriented to person, place, and time.  Skin: Skin is warm and dry.  There is with close palpation at the anterior axillary line just  above the nipple, a 3 mm freely movable round subcutaneous nodule. This is nontender. It is not fluctuant. There is no erythema.  Psychiatric: [name] [name] behavior is normal. Thought content normal.    Assessment & Plan:   [name] was seen today for cyst.  Diagnoses and all orders for this visit:  Sebaceous cyst of breast, right   I am having [name] [name] maintain [name] [name] eszopiclone and allopurinol.  Patient reassured, encouraged to monitor for changes in size and tenderness and redness. Return should those occur.   Follow-up: No Follow-up on file.  Mechele Claude, M.D.

## 2016-08-12 ENCOUNTER — Other Ambulatory Visit: Payer: Self-pay | Admitting: Family Medicine

## 2017-04-15 ENCOUNTER — Ambulatory Visit (INDEPENDENT_AMBULATORY_CARE_PROVIDER_SITE_OTHER): Admitting: Internal Medicine

## 2017-04-15 ENCOUNTER — Encounter: Payer: Self-pay | Admitting: Internal Medicine

## 2017-04-15 DIAGNOSIS — F5101 Primary insomnia: Secondary | ICD-10-CM | POA: Diagnosis not present

## 2017-04-15 DIAGNOSIS — G4733 Obstructive sleep apnea (adult) (pediatric): Secondary | ICD-10-CM

## 2017-04-15 MED ORDER — ESZOPICLONE 2 MG PO TABS: 2.0000 mg | ORAL_TABLET | Freq: Every evening | ORAL | 3 refills | Status: DC | PRN

## 2017-04-15 NOTE — Patient Instructions (Addendum)
Follow up with Dr Myrtis SerKatz to get the mouth piece fitted  Ok [facility].  Please call if we can help

## 2017-04-15 NOTE — Assessment & Plan Note (Signed)
He chose to change from CPAP to an oral appliance.  I do not get the impression he has been using it consistently, or recognizes much improvement with it.  It makes his mouth uncomfortable taking it in and out. I discussed medical concerns of untreated sleep apnea, noting he was never severe.  He will follow through with Dr. Myrtis SerKatz and I will see him again if needed.

## 2017-04-15 NOTE — Assessment & Plan Note (Signed)
[name] [name] has been helpful and was refilled

## 2017-04-15 NOTE — Progress Notes (Signed)
HPI  male never smoker followed for OSA, complicated by HBP Unattended home sleep test 01/30/15- mild OSA AHI 9.6 per hour with desaturation to 83%, body weight 256 pounds  ------------------------------------------------------------------------------------ 04/15/2016-52 year old male never smoker followed for OSA/ oral appliance , insomnia, complicated by HBP CPAP auto 10-20/ [facility]- replaced with oral appliance 6 month follow up for OSA. Breathing has been ok since last visit.  Compliance download has no recorded data at this visit. He states he is now using his fitted oral appliance instead of CPAP. Wife works night shift and is not usually home to report on snoring. He usually sleeps fairly well and feels reasonably rested during the day. Still uses Lunesta many nights, but may still wake during the night and be awake for 2 or 3 hours before regaining sleep. Busy thoughts. He is not aware of breathing issues during his sleep, limb movement disturbance or other parasomnias.   04/15/17- 52 year old male never smoker followed for OSA/ oral appliance , insomnia, complicated by HBP CPAP auto 10-20/ [facility]>> replaced with oral appliance ----OSA; Pt wears Oral appliance but states he has concerns about fit. Will contact Dr, Myrtis SerKatz after dental work. Waiting for crown repair.  He admits occasional difficulty falling asleep.  Says wife, working nights, does not hear him if he snores.  Asked refill Lunesta.  Anticipates retiring then [facility].  ROS-see HPI   + = positive Constitutional:    weight loss, night sweats, fevers, chills,  fatigue, lassitude. HEENT:    headaches, difficulty swallowing, tooth/dental problems, sore throat,       sneezing, itching, ear ache, nasal congestion, post nasal drip, snoring CV:    chest pain, orthopnea, PND, swelling in lower extremities, anasarca,                                                      dizziness, palpitations Resp:   shortness of breath with  exertion or at rest.                productive cough,   non-productive cough, coughing up of blood.              change in color of mucus.  wheezing.   Skin:    rash or lesions. GI:  No-   heartburn, indigestion, abdominal pain, nausea, vomiting,  GU:  MS:  + joint pain, stiffness,  Neuro-     nothing unusual Psych:  change in mood or affect.  depression or anxiety.   memory loss.  OBJ- Physical Exam General- Alert, Oriented, Affect-rather reserved, Distress- none acute, tall, muscular Skin- rash-none, lesions- none, excoriation- none Lymphadenopathy- none Head- atraumatic            Eyes- Gross vision intact, PERRLA, conjunctivae and secretions clear            Ears- Hearing, canals-normal            Nose- Clear, no-Septal dev, mucus, polyps, erosion, perforation             Throat- Mallampati IV , mucosa clear , drainage- none, tonsils- atrophic Neck- flexible , trachea midline, no stridor , thyroid nl, carotid no bruit Chest - symmetrical excursion , unlabored           Heart/CV- RRR , no murmur , no gallop  ,  no rub, nl s1 s2                           - JVD- none , edema- none, stasis changes- none, varices- none           Lung- clear to P&A, wheeze- none, cough- none , dullness-none, rub- none           Chest wall-  Abd-  Br/ Gen/ Rectal- Not done, not indicated Extrem- cyanosis- none, clubbing, none, atrophy- none, strength- nl Neuro- grossly intact to observation

## 2017-07-16 ENCOUNTER — Encounter: Payer: Self-pay | Admitting: Family Medicine

## 2017-07-16 ENCOUNTER — Ambulatory Visit (INDEPENDENT_AMBULATORY_CARE_PROVIDER_SITE_OTHER): Admitting: Family Medicine

## 2017-07-16 VITALS — BP 149/93 | HR 100 | Temp 102.4°F | Ht 73.0 in | Wt 254.0 lb

## 2017-07-16 DIAGNOSIS — R509 Fever, unspecified: Secondary | ICD-10-CM

## 2017-07-16 DIAGNOSIS — J069 Acute upper respiratory infection, unspecified: Secondary | ICD-10-CM | POA: Diagnosis not present

## 2017-07-16 DIAGNOSIS — B9789 Other viral agents as the cause of diseases classified elsewhere: Secondary | ICD-10-CM | POA: Diagnosis not present

## 2017-07-16 LAB — VERITOR FLU A/B WAIVED
INFLUENZA A: NEGATIVE
Influenza B: NEGATIVE

## 2017-07-16 MED ORDER — AMOXICILLIN-POT CLAVULANATE 875-125 MG PO TABS: 1.0000 | ORAL_TABLET | Freq: Two times a day (BID) | ORAL | 0 refills | Status: DC

## 2017-07-16 MED ORDER — GUAIFENESIN-CODEINE 100-10 MG/5ML PO SOLN: 5.0000 mL | Freq: Four times a day (QID) | ORAL | 0 refills | Status: DC | PRN

## 2017-07-16 NOTE — Progress Notes (Signed)
Subjective: CC: Chills/ Headache PCP: Wardell Honour, MD [name] [name] is a 52 y.o. male presenting to clinic today for:  1. Chills/ Headache Patient reports that he developed a headache, chills, subjective fevers, myalgia on Monday.  He reports associated nonproductive cough.  He notes some dyspnea with running, which he typically does not have.  Denies any nausea, vomiting, diarrhea.  He does note he feels like he wants to gag though with coughing spells.  Denies any dysuria, hematuria.  He has been tolerating fluids without difficulty but notes that he likely needs to drink more because his urine is dark.  He is used Alka-Seltzer cold and Zyrtec with some improvement in symptoms but symptoms return after the medications wear off.  He notes that he has been bitten by 3 ticks over the last couple of days, citing that he was camped out during drill.  No target lesions appreciated.  Symptoms preceded tick bites.   ROS: Per HPI  Allergies  Allergen Reactions  . Sulfa Antibiotics     Unknown-was told that   Past Medical History:  Diagnosis Date  . Gout   . Medical history non-contributory   . Wears glasses     Current Outpatient Medications:  .  allopurinol (ZYLOPRIM) 300 MG tablet, TAKE 1 TABLET AT BEDTIME, Disp: 90 tablet, Rfl: 0 .  eszopiclone (LUNESTA) 2 MG TABS tablet, Take 1 tablet (2 mg total) by mouth at bedtime as needed for sleep. Take immediately before bedtime, Disp: 90 tablet, Rfl: 3 Social History   Socioeconomic History  . Marital status: Married    Spouse name: Not on file  . Number of children: Not on file  . Years of education: Not on file  . Highest education level: Not on file  Occupational History  . Not on file  Social Needs  . Financial resource strain: Not on file  . Food insecurity:    Worry: Not on file    Inability: Not on file  . Transportation needs:    Medical: Not on file    Non-medical: Not on file  Tobacco Use  . Smoking status:  Never Smoker  . Smokeless tobacco: Never Used  Substance and Sexual Activity  . Alcohol use: Yes    Comment: rare  . Drug use: No  . Sexual activity: Not on file  Lifestyle  . Physical activity:    Days per week: Not on file    Minutes per session: Not on file  . Stress: Not on file  Relationships  . Social connections:    Talks on phone: Not on file    Gets together: Not on file    Attends religious service: Not on file    Active member of club or organization: Not on file    Attends meetings of clubs or organizations: Not on file    Relationship status: Not on file  . Intimate partner violence:    Fear of current or ex partner: Not on file    Emotionally abused: Not on file    Physically abused: Not on file    Forced sexual activity: Not on file  Other Topics Concern  . Not on file  Social History Narrative  . Not on file   Family History  Problem Relation Age of Onset  . Cancer Mother        Precancerous colon polyps  . Diabetes Father   . Hypertension Father   . Heart attack Father  Objective: Office vital signs reviewed. BP (!) 149/93   Pulse 100   Temp (!) 102.4 F (39.1 C) (Oral)   Ht _0  (1.854 m)   Wt 254 lb (115.2 kg)   BMI 33.51 kg/m   Physical Examination:  General: Awake, alert, well nourished, ill appearing, No acute distress HEENT: Normal    Neck: No masses palpated. No lymphadenopathy    Ears: Tympanic membranes intact, normal light reflex, no erythema, no bulging    Eyes: PERRLA, extraocular membranes intact, sclera white    Nose: nasal turbinates moist, clear nasal discharge    Throat: moist mucus membranes, no erythema, no tonsillar exudate.  Airway is patent Cardio: regular rate and rhythm, S1S2 heard, no murmurs appreciated Pulm: clear to auscultation bilaterally, no wheezes, rhonchi or rales; normal work of breathing on room air Skin: Warm, clammy, no rashes.  Assessment/ Plan: 52 y.o. male   1. Viral URI with cough Patient  is febrile here in office.  His physical exam was unremarkable except for warm clammy skin.  His rapid flu was negative.  I think this is likely a viral URI with cough.  He has had a few tick bites over the last couple of days but the symptoms seem to have preceded the tick bites.  We will proceed with supportive treatment.  Push oral fluids.  Use Tylenol if needed for fevers or pain.  Robitussin-AC prescribed for cough.  Caution sedation.  Patient will hold Lunesta while taking.  Work note provided.  Pocket prescription for Augmentin also provided since we are coming into the weekend.  If symptoms are not improving by Sunday or acutely worsen I instructed him to start the medication.  Reasons for return evaluation and emergent evaluation reviewed.  Patient to follow-up as needed.  2. Febrile illness - Veritor Flu A/B Waived - CMP14+EGFR - CBC with Differential   Orders Placed This Encounter  Procedures  . Veritor Flu A/B Waived    Order Specific Question:   Source    Answer:   nasal  . CMP14+EGFR  . CBC with Differential   Meds ordered this encounter  Medications  . amoxicillin-clavulanate (AUGMENTIN) 875-125 MG tablet    Sig: Take 1 tablet by mouth 2 (two) times daily.    Dispense:  20 tablet    Refill:  0  . guaiFENesin-codeine 100-10 MG/5ML syrup    Sig: Take 5 mLs by mouth every 6 (six) hours as needed for cough.    Dispense:  100 mL    Refill:  0    The Narcotic Database has been reviewed.  There were no red flags.      Janora Norlander, DO [place] [phone]

## 2017-07-16 NOTE — Patient Instructions (Signed)
Your flu test was negative.  This is likely a viral upper respiratory infection.  I prescribed you a cough medication to take.  Be mindful that this does contain codeine and can cause sedation.  Avoid other sedating type medications.  Do not operate heavy machinery or drive while taking.  I have given you a pocket prescription for an antibiotic to use if symptoms are not improving by Sunday, you may go ahead and fill this.  Make sure that you are getting plenty of fluids.  Use Tylenol for fevers or pain.  You had labs performed today.  You will be contacted with the results of the labs once they are available, usually in the next 3 days for routine lab work.  - Get plenty of rest and drink plenty of fluids. - Try to breathe moist air. Use a cold mist humidifier. - Consume warm fluids (soup or tea) to provide relief for a stuffy nose and to loosen phlegm. - For nasal stuffiness, try saline nasal spray or a Neti Pot.  Afrin nasal spray can also be used but this product should not be used longer than 3 days or it will cause rebound nasal stuffiness (worsening nasal congestion). - For sore throat pain relief: use chloraseptic spray, suck on throat lozenges, hard candy or popsicles; gargle with warm salt water (1/4 tsp. salt per 8 oz. of water); and eat soft, bland foods. - Eat a well-balanced diet. If you cannot, ensure you are getting enough nutrients by taking a daily multivitamin. - Avoid dairy products, as they can thicken phlegm. - Avoid alcohol, as it impairs your body's immune system.  CONTACT YOUR DOCTOR IF YOU EXPERIENCE ANY OF THE FOLLOWING: - High fever - Ear pain - Sinus-type headache - Unusually severe cold symptoms - Cough that gets worse while other cold symptoms improve - Flare up of any chronic lung problem, such as asthma - Your symptoms persist longer than 2 weeks

## 2017-07-18 LAB — CBC WITH DIFFERENTIAL/PLATELET
BASOS ABS: 0 10*3/uL (ref 0.0–0.2)
Basos: 0 %
EOS (ABSOLUTE): 0 10*3/uL (ref 0.0–0.4)
EOS: 1 %
HEMATOCRIT: 38.5 % (ref 37.5–51.0)
HEMOGLOBIN: 13.6 g/dL (ref 13.0–17.7)
Immature Grans (Abs): 0 10*3/uL (ref 0.0–0.1)
Immature Granulocytes: 1 %
LYMPHS ABS: 0.6 10*3/uL — AB (ref 0.7–3.1)
Lymphs: 9 %
MCH: 30.4 pg (ref 26.6–33.0)
MCHC: 35.3 g/dL (ref 31.5–35.7)
MCV: 86 fL (ref 79–97)
MONOCYTES: 18 %
Monocytes Absolute: 1.3 10*3/uL — ABNORMAL HIGH (ref 0.1–0.9)
NEUTROS ABS: 5 10*3/uL (ref 1.4–7.0)
Neutrophils: 71 %
Platelets: 157 10*3/uL (ref 150–379)
RBC: 4.47 x10E6/uL (ref 4.14–5.80)
RDW: 13.1 % (ref 12.3–15.4)
WBC: 6.9 10*3/uL (ref 3.4–10.8)

## 2017-07-18 LAB — CMP14+EGFR
ALBUMIN: 4.2 g/dL (ref 3.5–5.5)
ALT: 37 IU/L (ref 0–44)
AST: 35 IU/L (ref 0–40)
Albumin/Globulin Ratio: 1.4 (ref 1.2–2.2)
Alkaline Phosphatase: 90 IU/L (ref 39–117)
BILIRUBIN TOTAL: 0.7 mg/dL (ref 0.0–1.2)
BUN / CREAT RATIO: 10 (ref 9–20)
BUN: 18 mg/dL (ref 6–24)
CO2: 22 mmol/L (ref 20–29)
Calcium: 9.2 mg/dL (ref 8.7–10.2)
Chloride: 99 mmol/L (ref 96–106)
Creatinine, Ser: 1.73 mg/dL — ABNORMAL HIGH (ref 0.76–1.27)
GFR calc non Af Amer: 44 mL/min/{1.73_m2} — ABNORMAL LOW (ref >59)
GFR, EST AFRICAN AMERICAN: 51 mL/min/{1.73_m2} — AB (ref >59)
GLOBULIN, TOTAL: 3 g/dL (ref 1.5–4.5)
Glucose: 112 mg/dL — ABNORMAL HIGH (ref 65–99)
Potassium: 4.1 mmol/L (ref 3.5–5.2)
SODIUM: 138 mmol/L (ref 134–144)
TOTAL PROTEIN: 7.2 g/dL (ref 6.0–8.5)

## 2017-07-21 ENCOUNTER — Telehealth: Payer: Self-pay | Admitting: Family Medicine

## 2017-07-21 NOTE — Telephone Encounter (Signed)
I released these results to his my chart.  Please refer to the notes at the bottom.

## 2017-07-22 NOTE — Telephone Encounter (Signed)
Pt aware.

## 2017-08-25 ENCOUNTER — Other Ambulatory Visit: Payer: Self-pay | Admitting: Family Medicine

## 2017-08-25 NOTE — Telephone Encounter (Signed)
Authorize 30 days only. Then contact the patient letting them know that they will need an appointment before any further prescriptions can be sent in. 

## 2017-10-28 ENCOUNTER — Ambulatory Visit (INDEPENDENT_AMBULATORY_CARE_PROVIDER_SITE_OTHER): Admitting: Family Medicine

## 2017-10-28 ENCOUNTER — Encounter: Payer: Self-pay | Admitting: Family Medicine

## 2017-10-28 VITALS — BP 136/87 | HR 82 | Temp 99.1°F | Ht 73.0 in | Wt 264.0 lb

## 2017-10-28 DIAGNOSIS — I10 Essential (primary) hypertension: Secondary | ICD-10-CM | POA: Diagnosis not present

## 2017-10-28 DIAGNOSIS — M109 Gout, unspecified: Secondary | ICD-10-CM | POA: Diagnosis not present

## 2017-10-28 MED ORDER — AMLODIPINE BESYLATE 5 MG PO TABS: 5.0000 mg | ORAL_TABLET | Freq: Every day | ORAL | 2 refills | Status: DC

## 2017-10-28 MED ORDER — ALLOPURINOL 300 MG PO TABS: 300.0000 mg | ORAL_TABLET | Freq: Every day | ORAL | 0 refills | Status: DC

## 2017-10-28 NOTE — Progress Notes (Signed)
Subjective:  Patient ID: [name] [name], male    DOB: Sep 19, 1965  Age: 52 y.o. MRN: [id number]  CC: Hypertension (pt here today for elevated BP while he was at 3 week training with the [place])   HPI [name] [name] presents for  follow-up of hypertension. Patient has no history of headache chest pain or shortness of breath or recent cough. Patient also denies symptoms of TIA such as focal numbness or weakness.Has had multiple elevated readings up to 160/100 particularly at [facility] 2 weeks ago History [name] [name] has a past medical history of Gout, Medical history non-contributory, and Wears glasses.   He has a past surgical history that includes Wisdom tooth extraction and Knee arthroscopy with patellar tendon repair (Left, 09/03/2012).   His family history includes Cancer in his mother; Diabetes in his father; Heart attack in his father; Hypertension in his father.He reports that he has never smoked. He has never used smokeless tobacco. He reports that he drinks alcohol. He reports that he does not use drugs.  Current Outpatient Medications on File Prior to Visit  Medication Sig Dispense Refill  . eszopiclone (LUNESTA) 2 MG TABS tablet Take 1 tablet (2 mg total) by mouth at bedtime as needed for sleep. Take immediately before bedtime 90 tablet 3   No current facility-administered medications on file prior to visit.     ROS Review of Systems  Constitutional: Negative for fever.  Respiratory: Negative for shortness of breath.   Cardiovascular: Negative for chest pain.  Musculoskeletal: Negative for arthralgias (none recently. Takes med for past gout attacks.).  Skin: Negative for rash.    Objective:  BP 136/87   Pulse 82   Temp 99.1 F (37.3 C) (Oral)   Ht 6' 1" (1.854 m)   Wt 264 lb (119.7 kg)   BMI 34.83 kg/m   BP Readings from Last 3 Encounters:  10/28/17 136/87  07/16/17 (!) 149/93  04/15/17 132/74    Wt Readings from Last 3 Encounters:  10/28/17 264  lb (119.7 kg)  07/16/17 254 lb (115.2 kg)  04/15/17 257 lb 12.8 oz (116.9 kg)     Physical Exam  Constitutional: He is oriented to person, place, and time. He appears well-developed and well-nourished.  HENT:  Head: Normocephalic and atraumatic.  Right Ear: External ear normal.  Left Ear: External ear normal.  Mouth/Throat: No oropharyngeal exudate or posterior oropharyngeal erythema.  Eyes: Pupils are equal, round, and reactive to light.  Neck: Normal range of motion. Neck supple.  Cardiovascular: Normal rate and regular rhythm.  No murmur heard. Pulmonary/Chest: Breath sounds normal. No respiratory distress.  Musculoskeletal: Normal range of motion. He exhibits no tenderness.  Neurological: He is alert and oriented to person, place, and time.  Vitals reviewed.     Assessment & Plan:   [name] [name] was seen today for hypertension.  Diagnoses and all orders for this visit:  Essential hypertension  Gout of foot, unspecified cause, unspecified chronicity, unspecified laterality -     CMP14+EGFR -     Uric acid  Other orders -     amLODipine (NORVASC) 5 MG tablet; Take 1 tablet (5 mg total) by mouth daily. For blood pressure -     allopurinol (ZYLOPRIM) 300 MG tablet; Take 1 tablet (300 mg total) by mouth at bedtime.   Allergies as of 10/28/2017      Reactions   Sulfa Antibiotics    Unknown-was told that      Medication List  Accurate as of 10/28/17 10:10 PM. Always use your most recent med list.          allopurinol 300 MG tablet Commonly known as:  ZYLOPRIM Take 1 tablet (300 mg total) by mouth at bedtime.   amLODipine 5 MG tablet Commonly known as:  NORVASC Take 1 tablet (5 mg total) by mouth daily. For blood pressure   eszopiclone 2 MG Tabs tablet Commonly known as:  LUNESTA Take 1 tablet (2 mg total) by mouth at bedtime as needed for sleep. Take immediately before bedtime       Meds ordered this encounter  Medications  . amLODipine (NORVASC) 5 MG  tablet    Sig: Take 1 tablet (5 mg total) by mouth daily. For blood pressure    Dispense:  30 tablet    Refill:  2  . allopurinol (ZYLOPRIM) 300 MG tablet    Sig: Take 1 tablet (300 mg total) by mouth at bedtime.    Dispense:  90 tablet    Refill:  0      Follow-up: Return in about 6 weeks (around 12/09/2017).  Claretta Fraise, M.D.

## 2017-10-29 LAB — CMP14+EGFR
A/G RATIO: 1.8 (ref 1.2–2.2)
ALT: 30 IU/L (ref 0–44)
AST: 20 IU/L (ref 0–40)
Albumin: 4.4 g/dL (ref 3.5–5.5)
Alkaline Phosphatase: 61 IU/L (ref 39–117)
BUN/Creatinine Ratio: 9 (ref 9–20)
BUN: 14 mg/dL (ref 6–24)
Bilirubin Total: 0.3 mg/dL (ref 0.0–1.2)
CHLORIDE: 103 mmol/L (ref 96–106)
CO2: 24 mmol/L (ref 20–29)
Calcium: 9.5 mg/dL (ref 8.7–10.2)
Creatinine, Ser: 1.53 mg/dL — ABNORMAL HIGH (ref 0.76–1.27)
GFR calc non Af Amer: 51 mL/min/{1.73_m2} — ABNORMAL LOW (ref >59)
GFR, EST AFRICAN AMERICAN: 59 mL/min/{1.73_m2} — AB (ref >59)
GLOBULIN, TOTAL: 2.4 g/dL (ref 1.5–4.5)
Glucose: 91 mg/dL (ref 65–99)
Potassium: 4.1 mmol/L (ref 3.5–5.2)
SODIUM: 141 mmol/L (ref 134–144)
TOTAL PROTEIN: 6.8 g/dL (ref 6.0–8.5)

## 2017-10-29 LAB — URIC ACID: Uric Acid: 7.1 mg/dL (ref 3.7–8.6)

## 2017-12-11 ENCOUNTER — Ambulatory Visit (INDEPENDENT_AMBULATORY_CARE_PROVIDER_SITE_OTHER): Admitting: Family Medicine

## 2017-12-11 ENCOUNTER — Ambulatory Visit (INDEPENDENT_AMBULATORY_CARE_PROVIDER_SITE_OTHER)

## 2017-12-11 ENCOUNTER — Encounter: Payer: Self-pay | Admitting: Family Medicine

## 2017-12-11 VITALS — BP 154/89 | HR 62 | Temp 98.3°F | Ht 73.0 in | Wt 264.0 lb

## 2017-12-11 DIAGNOSIS — G4733 Obstructive sleep apnea (adult) (pediatric): Secondary | ICD-10-CM

## 2017-12-11 DIAGNOSIS — M25521 Pain in right elbow: Secondary | ICD-10-CM | POA: Diagnosis not present

## 2017-12-11 DIAGNOSIS — Z23 Encounter for immunization: Secondary | ICD-10-CM

## 2017-12-11 MED ORDER — ESZOPICLONE 3 MG PO TABS: 3.0000 mg | ORAL_TABLET | Freq: Every day | ORAL | 1 refills | Status: DC

## 2017-12-11 MED ORDER — AMLODIPINE BESYLATE 10 MG PO TABS: 10.0000 mg | ORAL_TABLET | Freq: Every day | ORAL | 1 refills | Status: DC

## 2017-12-11 MED ORDER — ALLOPURINOL 300 MG PO TABS: 300.0000 mg | ORAL_TABLET | Freq: Every day | ORAL | 1 refills | Status: DC

## 2017-12-11 NOTE — Progress Notes (Signed)
Subjective:  Patient ID: [name] [name], male    DOB: March 28, 1965  Age: 52 y.o. MRN: [id number]  CC: Follow-up   HPI [name] [name] presents for  follow-up of hypertension. Patient has no history of headache chest pain or shortness of breath or recent cough. Patient also denies symptoms of TIA such as focal numbness or weakness. Patient denies side effects from medication. States taking it regularly.  Pain at right elbow since injury.  Injury occurred sometime during the month of July of this year.  Pain is moderate it is annoying but does not prevent activities.   History [name] [name] has a past medical history of Gout, Medical history non-contributory, and Wears glasses.   [name] [name] has a past surgical history that includes Wisdom tooth extraction and Knee arthroscopy with patellar tendon repair (Left, 09/03/2012).   His family history includes Cancer in his mother; Diabetes in his father; Heart attack in his father; Hypertension in his father.[name] [name] reports that [name] [name] has never smoked. [name] [name] has never used smokeless tobacco. [name] [name] reports that [name] [name] drinks alcohol. [name] [name] reports that [name] [name] does not use drugs.  No current outpatient medications on file prior to visit.   No current facility-administered medications on file prior to visit.     ROS Review of Systems  Constitutional: Negative.   HENT: Negative.   Eyes: Negative for visual disturbance.  Respiratory: Negative for cough and shortness of breath.   Cardiovascular: Negative for chest pain and leg swelling.  Gastrointestinal: Negative for abdominal pain, diarrhea, nausea and vomiting.  Genitourinary: Negative for difficulty urinating.  Musculoskeletal: Negative for arthralgias and myalgias.  Skin: Negative for rash.  Neurological: Negative for headaches.  Psychiatric/Behavioral: Negative for sleep disturbance.    Objective:  BP (!) 154/89   Pulse 62   Temp 98.3 F (36.8 C)   Ht 6\' 1"  (1.854 m)   Wt 264 lb (119.7 kg)   BMI 34.83 kg/m   BP Readings  from Last 3 Encounters:  12/11/17 (!) 154/89  10/28/17 136/87  07/16/17 (!) 149/93    Wt Readings from Last 3 Encounters:  12/11/17 264 lb (119.7 kg)  10/28/17 264 lb (119.7 kg)  07/16/17 254 lb (115.2 kg)     Physical Exam  Constitutional: [name] [name] is oriented to person, place, and time. [name] [name] appears well-developed and well-nourished. No distress.  HENT:  Head: Normocephalic and atraumatic.  Right Ear: External ear normal.  Left Ear: External ear normal.  Nose: Nose normal.  Mouth/Throat: Oropharynx is clear and moist.  Eyes: Pupils are equal, round, and reactive to light. Conjunctivae and EOM are normal.  Neck: Normal range of motion. Neck supple.  Cardiovascular: Normal rate, regular rhythm and normal heart sounds.  No murmur heard. Pulmonary/Chest: Effort normal and breath sounds normal. No respiratory distress. [name] [name] has no wheezes. [name] [name] has no rales.  Abdominal: Soft. There is no tenderness.  Musculoskeletal: Normal range of motion. [name] [name] exhibits tenderness (Point tender at the olecranon and lateral radial head).  Neurological: [name] [name] is alert and oriented to person, place, and time. [name] [name] has normal reflexes.  Skin: Skin is warm and dry.  Psychiatric: [name] [name] has a normal mood and affect. His behavior is normal. Judgment and thought content normal.      Assessment & Plan:   [name] [name] was seen today for follow-up.  Diagnoses and all orders for this visit:  Right elbow pain -     Cancel: DG Elbow Complete Right -     DG Elbow 2 Views Right  Obstructive sleep apnea  Need for immunization against influenza -     Flu Vaccine QUAD 36+ mos IM  Other orders -     Eszopiclone (ESZOPICLONE) 3 MG TABS; Take 1 tablet (3 mg total) by mouth at bedtime. Take immediately before bedtime -     Discontinue: amLODipine (NORVASC) 10 MG tablet; Take 1 tablet (10 mg total) by mouth daily. -     Discontinue: allopurinol (ZYLOPRIM) 300 MG tablet; Take 1 tablet (300 mg total) by mouth at bedtime. -      allopurinol (ZYLOPRIM) 300 MG tablet; Take 1 tablet (300 mg total) by mouth at bedtime. -     amLODipine (NORVASC) 10 MG tablet; Take 1 tablet (10 mg total) by mouth daily.   Allergies as of 12/11/2017      Reactions   Sulfa Antibiotics    Unknown-was told that      Medication List        Accurate as of 12/11/17 11:59 PM. Always use your most recent med list.          allopurinol 300 MG tablet Commonly known as:  ZYLOPRIM Take 1 tablet (300 mg total) by mouth at bedtime.   amLODipine 10 MG tablet Commonly known as:  NORVASC Take 1 tablet (10 mg total) by mouth daily.   Eszopiclone 3 MG Tabs Take 1 tablet (3 mg total) by mouth at bedtime. Take immediately before bedtime       Meds ordered this encounter  Medications  . Eszopiclone (ESZOPICLONE) 3 MG TABS    Sig: Take 1 tablet (3 mg total) by mouth at bedtime. Take immediately before bedtime    Dispense:  90 tablet    Refill:  1  . DISCONTD: amLODipine (NORVASC) 10 MG tablet    Sig: Take 1 tablet (10 mg total) by mouth daily.    Dispense:  90 tablet    Refill:  1  . DISCONTD: allopurinol (ZYLOPRIM) 300 MG tablet    Sig: Take 1 tablet (300 mg total) by mouth at bedtime.    Dispense:  90 tablet    Refill:  1  . allopurinol (ZYLOPRIM) 300 MG tablet    Sig: Take 1 tablet (300 mg total) by mouth at bedtime.    Dispense:  90 tablet    Refill:  1  . amLODipine (NORVASC) 10 MG tablet    Sig: Take 1 tablet (10 mg total) by mouth daily.    Dispense:  90 tablet    Refill:  1    We discussed a elbow brace.  Patient will explore getting that at a local medical supply house.  Follow-up: Return in about 6 months (around 06/12/2018).  Mechele Claude, M.D.

## 2017-12-11 NOTE — Patient Instructions (Signed)
Follow up in 1 month with Dr. Darlyn Read

## 2017-12-15 ENCOUNTER — Encounter: Payer: Self-pay | Admitting: Family Medicine

## 2018-01-20 ENCOUNTER — Ambulatory Visit: Admitting: Family Medicine

## 2018-01-28 ENCOUNTER — Encounter: Payer: Self-pay | Admitting: Family Medicine

## 2018-02-09 ENCOUNTER — Ambulatory Visit: Admitting: Family Medicine

## 2018-02-10 ENCOUNTER — Encounter: Payer: Self-pay | Admitting: Family Medicine

## 2018-02-11 ENCOUNTER — Encounter: Payer: Self-pay | Admitting: Family Medicine

## 2018-02-11 ENCOUNTER — Ambulatory Visit (INDEPENDENT_AMBULATORY_CARE_PROVIDER_SITE_OTHER): Admitting: Family Medicine

## 2018-02-11 VITALS — BP 148/89 | HR 82 | Temp 98.9°F | Ht 73.0 in | Wt 269.0 lb

## 2018-02-11 DIAGNOSIS — I10 Essential (primary) hypertension: Secondary | ICD-10-CM

## 2018-02-11 DIAGNOSIS — R600 Localized edema: Secondary | ICD-10-CM

## 2018-02-11 MED ORDER — METOPROLOL SUCCINATE ER 50 MG PO TB24: 50.0000 mg | ORAL_TABLET | Freq: Every day | ORAL | 2 refills | Status: DC

## 2018-02-11 MED ORDER — AMLODIPINE BESYLATE 5 MG PO TABS: 5.0000 mg | ORAL_TABLET | Freq: Every day | ORAL | 5 refills | Status: DC

## 2018-02-11 NOTE — Progress Notes (Signed)
Subjective:  Patient ID: [name] [name], male    DOB: 02/21/1966  Age: 52 y.o. MRN: [id number]  CC: Hypertension (one month recheck) and right elbow pain (recheck)   HPI [name] [name] presents for  follow-up of hypertension. Patient has no history of headache chest pain or shortness of breath or recent cough. Patient also denies symptoms of TIA such as focal numbness or weakness. Patient reports swelling up to knees as a side effects from medication. States taking it regularly. Notes momentary palpitations around bedtime most nights. No chest pain or dyspnea. Energy is good.    History [name] [name] has a past medical history of Gout, Medical history non-contributory, and Wears glasses.   [name] [name] has a past surgical history that includes Wisdom tooth extraction and Knee arthroscopy with patellar tendon repair (Left, 09/03/2012).   His family history includes Cancer in his mother; Diabetes in his father; Heart attack in his father; Hypertension in his father.[name] [name] reports that [name] [name] has never smoked. [name] [name] has never used smokeless tobacco. [name] [name] reports that [name] [name] drinks alcohol. [name] [name] reports that [name] [name] does not use drugs.  Current Outpatient Medications on File Prior to Visit  Medication Sig Dispense Refill  . allopurinol (ZYLOPRIM) 300 MG tablet Take 1 tablet (300 mg total) by mouth at bedtime. 90 tablet 1  . Eszopiclone (ESZOPICLONE) 3 MG TABS Take 1 tablet (3 mg total) by mouth at bedtime. Take immediately before bedtime 90 tablet 1   No current facility-administered medications on file prior to visit.     ROS Review of Systems  Constitutional: Negative.   HENT: Negative.   Eyes: Negative for visual disturbance.  Respiratory: Negative for cough and shortness of breath.   Cardiovascular: Positive for palpitations. Negative for chest pain and leg swelling.  Gastrointestinal: Negative for abdominal pain, diarrhea, nausea and vomiting.  Genitourinary: Negative for difficulty urinating.  Musculoskeletal: Negative for  arthralgias and myalgias.  Skin: Negative for rash.  Neurological: Negative for headaches.  Psychiatric/Behavioral: Negative for sleep disturbance.    Objective:  BP (!) 148/89   Pulse 82   Temp 98.9 F (37.2 C) (Oral)   Ht 6\' 1"  (1.854 m)   Wt 269 lb (122 kg)   BMI 35.49 kg/m   BP Readings from Last 3 Encounters:  02/11/18 (!) 148/89  12/11/17 (!) 154/89  10/28/17 136/87    Wt Readings from Last 3 Encounters:  02/11/18 269 lb (122 kg)  12/11/17 264 lb (119.7 kg)  10/28/17 264 lb (119.7 kg)     Physical Exam  Constitutional: [name] [name] is oriented to person, place, and time. [name] [name] appears well-developed and well-nourished. No distress.  HENT:  Head: Normocephalic and atraumatic.  Right Ear: External ear normal.  Left Ear: External ear normal.  Nose: Nose normal.  Mouth/Throat: Oropharynx is clear and moist.  Eyes: Pupils are equal, round, and reactive to light. Conjunctivae and EOM are normal.  Neck: Normal range of motion. Neck supple.  Cardiovascular: Normal rate, regular rhythm and normal heart sounds.  No murmur heard. Pulmonary/Chest: Effort normal and breath sounds normal. No respiratory distress. [name] [name] has no wheezes. [name] [name] has no rales.  Abdominal: Soft. There is no tenderness.  Musculoskeletal: Normal range of motion.  Neurological: [name] [name] is alert and oriented to person, place, and time. [name] [name] has normal reflexes.  Skin: Skin is warm and dry.  Psychiatric: [name] [name] has a normal mood and affect. His behavior is normal. Judgment and thought content normal.      Assessment & Plan:  [name] was seen today for hypertension and right elbow pain.  Diagnoses and all orders for this visit:  Essential hypertension  Other orders -     metoprolol succinate (TOPROL-XL) 50 MG 24 hr tablet; Take 1 tablet (50 mg total) by mouth daily. For blood pressure control -     amLODipine (NORVASC) 5 MG tablet; Take 1 tablet (5 mg total) by mouth daily.   Allergies as of 02/11/2018      Reactions    Sulfa Antibiotics    Unknown-was told that      Medication List        Accurate as of 02/11/18  6:09 PM. Always use your most recent med list.          allopurinol 300 MG tablet Commonly known as:  ZYLOPRIM Take 1 tablet (300 mg total) by mouth at bedtime.   amLODipine 5 MG tablet Commonly known as:  NORVASC Take 1 tablet (5 mg total) by mouth daily.   Eszopiclone 3 MG Tabs Take 1 tablet (3 mg total) by mouth at bedtime. Take immediately before bedtime   metoprolol succinate 50 MG 24 hr tablet Commonly known as:  TOPROL-XL Take 1 tablet (50 mg total) by mouth daily. For blood pressure control       Meds ordered this encounter  Medications  . metoprolol succinate (TOPROL-XL) 50 MG 24 hr tablet    Sig: Take 1 tablet (50 mg total) by mouth daily. For blood pressure control    Dispense:  30 tablet    Refill:  2  . amLODipine (NORVASC) 5 MG tablet    Sig: Take 1 tablet (5 mg total) by mouth daily.    Dispense:  30 tablet    Refill:  5    Today I reduced his dose of amlodipine from 10 mg to 5 in order to reduce his edema.  I added metoprolol 50 mg for improved blood pressure control.  Follow-up: Return in about 6 weeks (around 03/25/2018).  Mechele Claude, M.D.

## 2018-02-23 ENCOUNTER — Telehealth: Payer: Self-pay | Admitting: Family Medicine

## 2018-02-23 NOTE — Telephone Encounter (Signed)
PT states that he does want to go forth with getting scheduled to do the EKG that Dr Darlyn ReadStacks has mentioned.

## 2018-02-23 NOTE — Telephone Encounter (Signed)
Pt advised we can do EKG at his 6 week f/u on 03/25/18 with Dr Darlyn ReadStacks and pt voiced understanding.

## 2018-03-17 ENCOUNTER — Emergency Department (HOSPITAL_COMMUNITY)
Admission: EM | Admit: 2018-03-17 | Discharge: 2018-03-17 | Disposition: A | Discharge status: Home / Self Care | Attending: Emergency Medicine | Admitting: Emergency Medicine

## 2018-03-17 ENCOUNTER — Encounter (HOSPITAL_COMMUNITY): Payer: Self-pay | Admitting: Emergency Medicine

## 2018-03-17 ENCOUNTER — Other Ambulatory Visit: Payer: Self-pay

## 2018-03-17 DIAGNOSIS — Z79899 Other long term (current) drug therapy: Secondary | ICD-10-CM | POA: Diagnosis not present

## 2018-03-17 DIAGNOSIS — M542 Cervicalgia: Secondary | ICD-10-CM | POA: Diagnosis present

## 2018-03-17 DIAGNOSIS — M436 Torticollis: Secondary | ICD-10-CM | POA: Diagnosis not present

## 2018-03-17 MED ORDER — CYCLOBENZAPRINE HCL 10 MG PO TABS
10.0000 mg | ORAL_TABLET | Freq: Once | ORAL | Status: AC
  Administered 2018-03-17: 10 mg via ORAL
  Filled 2018-03-17: qty 1

## 2018-03-17 MED ORDER — ACETAMINOPHEN 500 MG PO TABS
1000.0000 mg | ORAL_TABLET | Freq: Once | ORAL | Status: AC
  Administered 2018-03-17: 1000 mg via ORAL
  Filled 2018-03-17: qty 2

## 2018-03-17 MED ORDER — CYCLOBENZAPRINE HCL 10 MG PO TABS: 10.0000 mg | ORAL_TABLET | Freq: Three times a day (TID) | ORAL | 0 refills | Status: DC | PRN

## 2018-03-17 NOTE — ED Provider Notes (Signed)
[facility] EMERGENCY DEPARTMENT Provider Note   CSN: [id number] Arrival date & time: 03/17/18  [id number]     History   Chief Complaint Chief Complaint  Patient presents with  . Neck Pain    HPI [name] FeyKing J [place] is a 53 y.o. male.  Complains of right-sided neck pain onset 2 days ago.  Pain is worse with rotating his neck.  Improved with holding his head tilted slightly to the left.  Pain radiates to the occiput.  No fever no trauma.  No sore throat.  No ear pain.  No chest pain no difficulty swallowing or pain on swallowing no other associated symptoms.  No treatment prior to coming here  HPI  Past Medical History:  Diagnosis Date  . Gout   . Medical history non-contributory   . Wears glasses     Patient Active Problem List   Diagnosis Date Noted  . Insomnia 10/23/2015  . Obstructive sleep apnea 01/16/2015  . Gout 01/05/2015  . Elevated blood pressure 01/05/2015    Past Surgical History:  Procedure Laterality Date  . KNEE ARTHROSCOPY WITH PATELLAR TENDON REPAIR Left 09/03/2012   Procedure: LEFT KNEE ARTHROSCOPY, LOOSE BODY EXCISION, PATELLAR TENDON REPAIR, EXCISION TIBIAL TUBERCLE SPUR, CHONDROPLASTY PATELLA, EXCISION PLICA;  Surgeon: Loreta Aveaniel F Murphy, MD;  Location: [facility];  Service: Orthopedics;  Laterality: Left;  . WISDOM TOOTH EXTRACTION          Home Medications    Prior to Admission medications   Medication Sig Start Date End Date Taking? Authorizing Provider  allopurinol (ZYLOPRIM) 300 MG tablet Take 1 tablet (300 mg total) by mouth at bedtime. 12/11/17   Mechele ClaudeStacks, Warren, MD  amLODipine (NORVASC) 5 MG tablet Take 1 tablet (5 mg total) by mouth daily. 02/11/18   Mechele ClaudeStacks, Warren, MD  Eszopiclone (ESZOPICLONE) 3 MG TABS Take 1 tablet (3 mg total) by mouth at bedtime. Take immediately before bedtime 12/11/17   Mechele ClaudeStacks, Warren, MD  metoprolol succinate (TOPROL-XL) 50 MG 24 hr tablet Take 1 tablet (50 mg total) by mouth daily. For blood pressure control  02/11/18   Mechele ClaudeStacks, Warren, MD    Family History Family History  Problem Relation Age of Onset  . Cancer Mother        Precancerous colon polyps  . Diabetes Father   . Hypertension Father   . Heart attack Father     Social History Social History   Tobacco Use  . Smoking status: Never Smoker  . Smokeless tobacco: Never Used  Substance Use Topics  . Alcohol use: Yes    Comment: rare  . Drug use: No     Allergies   Sulfa antibiotics   Review of Systems Review of Systems  Constitutional: Negative.   HENT: Negative.   Respiratory: Negative.   Cardiovascular: Negative.   Gastrointestinal: Negative.   Musculoskeletal: Positive for neck pain.  Skin: Negative.   Neurological: Negative.   Psychiatric/Behavioral: Negative.   All other systems reviewed and are negative.    Physical Exam Updated Vital Signs BP (!) 151/102 (BP Location: Right Arm)   Pulse 66   Temp 99.3 F (37.4 C) (Oral)   Resp 18   Ht 6\' 1"  (1.854 m)   Wt 117 kg   SpO2 99%   BMI 34.04 kg/m   Physical Exam Vitals signs and nursing note reviewed.  Constitutional:      Appearance: He is well-developed. He is not ill-appearing.  HENT:     Head: Normocephalic and atraumatic.  Right Ear: Tympanic membrane, ear canal and external ear normal.     Left Ear: Tympanic membrane, ear canal and external ear normal.     Mouth/Throat:     Mouth: Mucous membranes are moist.     Pharynx: No posterior oropharyngeal erythema.  Eyes:     Conjunctiva/sclera: Conjunctivae normal.     Pupils: Pupils are equal, round, and reactive to light.  Neck:     Musculoskeletal: Neck supple.     Thyroid: No thyromegaly.     Trachea: No tracheal deviation.     Comments: Spasm and tenderness over right trapezius muscle.  No point tenderness.  No bruit.  No redness trachea midline.  Voice normal Cardiovascular:     Rate and Rhythm: Normal rate and regular rhythm.     Heart sounds: No murmur.  Pulmonary:     Effort:  Pulmonary effort is normal.     Breath sounds: Normal breath sounds.  Abdominal:     General: Bowel sounds are normal. There is no distension.     Palpations: Abdomen is soft.     Tenderness: There is no abdominal tenderness.  Musculoskeletal: Normal range of motion.        General: No tenderness.     Comments: All 4 extremities with full range of motion.  He has pain right at trapezius muscle on abduction of right shoulder.  All 4 extremities neurovascular intact  Skin:    General: Skin is warm and dry.     Findings: No rash.  Neurological:     General: No focal deficit present.     Mental Status: He is alert.     Coordination: Coordination normal.     Comments: Cranial nerves II through XII grossly intact.  Motor strength 5/5 overall.  Gait normal DTR symmetric bilaterally knee jerk ankle jerk biceps was downgoing bilaterally      ED Treatments / Results  Labs (all labs ordered are listed, but only abnormal results are displayed) Labs Reviewed - No data to display  EKG None  Radiology No results found.  Procedures Procedures (including critical care time)  Medications Ordered in ED Medications - No data to display   Initial Impression / Assessment and Plan / ED Course  I have reviewed the triage vital signs and the nursing notes.  Pertinent labs & imaging results that were available during my care of the patient were reviewed by me and considered in my medical decision making (see chart for details).     Plan plan prescription for Flexeril.  Tylenol. 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

## 2018-03-17 NOTE — ED Triage Notes (Signed)
Patient complains of right neck pain that goes up to back of head. Denies head ache, fever, chills, nausea, dizziness. Patient states he woke up with pain on Sunday morning. He thinks he may have slept wrong, but pain has not gone away.

## 2018-03-17 NOTE — Discharge Instructions (Addendum)
Call to schedule an appointment with Dr. Darlyn Read if not improving in 2 or 3 days.  Take Tylenol as directed for pain and the medication prescribed for spasm

## 2018-03-25 ENCOUNTER — Ambulatory Visit (INDEPENDENT_AMBULATORY_CARE_PROVIDER_SITE_OTHER)

## 2018-03-25 ENCOUNTER — Encounter: Payer: Self-pay | Admitting: Family Medicine

## 2018-03-25 ENCOUNTER — Ambulatory Visit (INDEPENDENT_AMBULATORY_CARE_PROVIDER_SITE_OTHER): Admitting: Family Medicine

## 2018-03-25 VITALS — BP 135/77 | HR 78 | Temp 98.8°F | Ht 73.0 in | Wt 271.0 lb

## 2018-03-25 DIAGNOSIS — M542 Cervicalgia: Secondary | ICD-10-CM

## 2018-03-25 DIAGNOSIS — G959 Disease of spinal cord, unspecified: Secondary | ICD-10-CM | POA: Diagnosis not present

## 2018-03-25 MED ORDER — NABUMETONE 500 MG PO TABS: 1000.0000 mg | ORAL_TABLET | Freq: Two times a day (BID) | ORAL | 1 refills | Status: DC

## 2018-03-25 MED ORDER — BETAMETHASONE SOD PHOS & ACET 6 (3-3) MG/ML IJ SUSP
6.0000 mg | Freq: Once | INTRAMUSCULAR | Status: AC
  Administered 2018-03-25: 6 mg via INTRAMUSCULAR

## 2018-03-25 NOTE — Addendum Note (Signed)
Addended by: Gwenith Daily on: 03/25/2018 04:56 PM   Modules accepted: Orders

## 2018-03-25 NOTE — Progress Notes (Signed)
Subjective:  Patient ID: [name] [name], male    DOB: 1965/08/27  Age: 53 y.o. MRN: [id number]  CC: Neck Pain (ED visit on 03/17/18, Dx with torticollis. Given Tylenol and muscle relaxers and has used heating pad and Salonpas patches. No relief) and Medical Management of Chronic Issues (6 week f/u)   HPI [name] [name] presents for neck pain on the right.  Patient rates it as severe.  Onset is minor [place] about 10 days ago.  Within a day or 2 it had increased to the point where he could not go to work.  He went to the emergency room and was given a muscle relaxer and told to use heat.  Couple of days later he went for a physical and was given a shot he does not know what kind of shot it was.  There is a little relief but overall it is actually been getting worse over time.  He was told that perhaps he would need an MRI for it.  Currently pain is 7-8/10.  It is primarily in the posterior cervical musculature on the right.  He is unable to turn his head significantly and what little he can do is very very slow.  He has had no known injury.  Depression screen [facility] 2/9 02/11/2018 12/11/2017 10/28/2017  Decreased Interest 0 0 0  Down, Depressed, Hopeless 0 0 0  PHQ - 2 Score 0 0 0    History [name] [name] has a past medical history of Gout, Medical history non-contributory, and Wears glasses.   He has a past surgical history that includes Wisdom tooth extraction and Knee arthroscopy with patellar tendon repair (Left, 09/03/2012).   His family history includes Cancer in his mother; Diabetes in his father; Heart attack in his father; Hypertension in his father.He reports that he has never smoked. He has never used smokeless tobacco. He reports current alcohol use. He reports that he does not use drugs.    ROS Review of Systems  Constitutional: Negative for fever.  Respiratory: Negative for shortness of breath.   Cardiovascular: Negative for chest pain.  Musculoskeletal: Positive for arthralgias, myalgias and  neck pain.  Skin: Negative for rash.    Objective:  BP 135/77   Pulse 78   Temp 98.8 F (37.1 C)   Ht 6\' 1"  (1.854 m)   Wt 271 lb (122.9 kg)   BMI 35.75 kg/m   BP Readings from Last 3 Encounters:  03/25/18 135/77  03/17/18 137/88  02/11/18 (!) 148/89    Wt Readings from Last 3 Encounters:  03/25/18 271 lb (122.9 kg)  03/17/18 258 lb (117 kg)  02/11/18 269 lb (122 kg)     Physical Exam Vitals signs reviewed.  Constitutional:      Appearance: He is well-developed.  HENT:     Head: Normocephalic and atraumatic.     Right Ear: External ear normal.     Left Ear: External ear normal.     Mouth/Throat:     Pharynx: No oropharyngeal exudate or posterior oropharyngeal erythema.  Eyes:     Pupils: Pupils are equal, round, and reactive to light.  Neck:     Musculoskeletal: Normal range of motion and neck supple.  Cardiovascular:     Rate and Rhythm: Normal rate and regular rhythm.     Heart sounds: No murmur.  Pulmonary:     Effort: No respiratory distress.     Breath sounds: Normal breath sounds.  Musculoskeletal:  General: Tenderness (  With decreased range of motion for the neck.  There is tenderness at the cervical spinalis on the right and the superior margin of the trapezius from its origin to the midclavicular line.  Patient is able to actively move about 20 degrees to the left and) present.  Neurological:     Mental Status: He is alert and oriented to person, place, and time.     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  Diagnoses and all orders for this visit:  Neck pain -     DG Cervical Spine Complete; Future  Cervical myelopathy (HCC) -     betamethasone acetate-betamethasone sodium phosphate (CELESTONE) injection 6 mg -     MR Cervical Spine Wo Contrast; Future  Other orders -     nabumetone (RELAFEN) 500 MG tablet; Take 2  tablets (1,000 mg total) by mouth 2 (two) times daily. For muscle and joint pain       I am having [name] [name] start on nabumetone. I am also having him maintain his Eszopiclone, allopurinol, metoprolol succinate, amLODipine, cyclobenzaprine, predniSONE, and traMADol. We will continue to administer betamethasone acetate-betamethasone sodium phosphate.  Allergies as of 03/25/2018      Reactions   Sulfa Antibiotics    Unknown-was told that      Medication List       Accurate as of March 25, 2018  4:44 PM. Always use your most recent med list.        allopurinol 300 MG tablet Commonly known as:  ZYLOPRIM Take 1 tablet (300 mg total) by mouth at bedtime.   amLODipine 5 MG tablet Commonly known as:  NORVASC Take 1 tablet (5 mg total) by mouth daily.   cyclobenzaprine 10 MG tablet Commonly known as:  FLEXERIL Take 1 tablet (10 mg total) by mouth 3 (three) times daily as needed for muscle spasms.   Eszopiclone 3 MG Tabs Commonly known as:  eszopiclone Take 1 tablet (3 mg total) by mouth at bedtime. Take immediately before bedtime   metoprolol succinate 50 MG 24 hr tablet Commonly known as:  TOPROL-XL Take 1 tablet (50 mg total) by mouth daily. For blood pressure control   nabumetone 500 MG tablet Commonly known as:  RELAFEN Take 2 tablets (1,000 mg total) by mouth 2 (two) times daily. For muscle and joint pain   predniSONE 10 MG tablet Commonly known as:  DELTASONE   traMADol 50 MG tablet Commonly known as:  ULTRAM Take 50 mg by mouth.        Follow-up: Return in about 2 weeks (around 04/08/2018).  Mechele ClaudeWarren Jenna Routzahn, M.D.

## 2018-04-08 ENCOUNTER — Ambulatory Visit (HOSPITAL_COMMUNITY)
Admission: RE | Admit: 2018-04-08 | Discharge: 2018-04-08 | Disposition: A | Discharge status: Home / Self Care | Source: Ambulatory Visit | Attending: Family Medicine | Admitting: Family Medicine

## 2018-04-08 DIAGNOSIS — G959 Disease of spinal cord, unspecified: Secondary | ICD-10-CM | POA: Diagnosis present

## 2018-04-10 ENCOUNTER — Other Ambulatory Visit: Payer: Self-pay

## 2018-04-10 DIAGNOSIS — G959 Disease of spinal cord, unspecified: Secondary | ICD-10-CM

## 2018-04-20 ENCOUNTER — Telehealth: Payer: Self-pay | Admitting: Family Medicine

## 2018-04-20 NOTE — Telephone Encounter (Signed)
Advised patient that coricidin would be appropriate

## 2018-05-12 ENCOUNTER — Other Ambulatory Visit: Payer: Self-pay | Admitting: Family Medicine

## 2018-07-29 ENCOUNTER — Telehealth: Payer: Self-pay | Admitting: Family Medicine

## 2018-07-29 NOTE — Telephone Encounter (Signed)
NO release on file

## 2018-07-30 NOTE — Telephone Encounter (Signed)
Left message with pt that we have not received records yet

## 2018-08-09 ENCOUNTER — Other Ambulatory Visit: Payer: Self-pay | Admitting: Family Medicine

## 2018-08-10 ENCOUNTER — Other Ambulatory Visit: Payer: Self-pay | Admitting: Family Medicine

## 2018-09-01 ENCOUNTER — Ambulatory Visit (INDEPENDENT_AMBULATORY_CARE_PROVIDER_SITE_OTHER): Admitting: Family Medicine

## 2018-09-01 ENCOUNTER — Encounter: Payer: Self-pay | Admitting: Family Medicine

## 2018-09-01 ENCOUNTER — Other Ambulatory Visit: Payer: Self-pay

## 2018-09-01 DIAGNOSIS — G959 Disease of spinal cord, unspecified: Secondary | ICD-10-CM | POA: Diagnosis not present

## 2018-09-01 DIAGNOSIS — I10 Essential (primary) hypertension: Secondary | ICD-10-CM | POA: Insufficient documentation

## 2018-09-01 DIAGNOSIS — H903 Sensorineural hearing loss, bilateral: Secondary | ICD-10-CM | POA: Insufficient documentation

## 2018-09-01 DIAGNOSIS — R600 Localized edema: Secondary | ICD-10-CM | POA: Diagnosis not present

## 2018-09-01 DIAGNOSIS — M5002 Cervical disc disorder with myelopathy, mid-cervical region, unspecified level: Secondary | ICD-10-CM | POA: Insufficient documentation

## 2018-09-01 DIAGNOSIS — I8390 Asymptomatic varicose veins of unspecified lower extremity: Secondary | ICD-10-CM | POA: Insufficient documentation

## 2018-09-01 MED ORDER — ESZOPICLONE 3 MG PO TABS: 3.0000 mg | ORAL_TABLET | Freq: Every day | ORAL | 1 refills | Status: DC

## 2018-09-01 MED ORDER — NABUMETONE 500 MG PO TABS: 1000.0000 mg | ORAL_TABLET | Freq: Two times a day (BID) | ORAL | 1 refills | Status: DC

## 2018-09-01 MED ORDER — METOPROLOL SUCCINATE ER 100 MG PO TB24: 100.0000 mg | ORAL_TABLET | Freq: Every day | ORAL | 1 refills | Status: DC

## 2018-09-01 MED ORDER — AMLODIPINE BESYLATE 2.5 MG PO TABS: 2.5000 mg | ORAL_TABLET | Freq: Every day | ORAL | 2 refills | Status: DC

## 2018-09-01 NOTE — Progress Notes (Signed)
Subjective:    Patient ID: [name] [name], male    DOB: 12/04/1965, 53 y.o.   MRN: [id number]   HPI: [name] [name] is a 53 y.o. male presenting for pain and swelling in the feet and ankles. Nabumetone helps with neck. Needs refills Swelling causes a tingling. BP doing okay with current regimen, but doesn't like the swelling. INterested I decreasing the amlodipine as a result.   [facility] 02/11/2018 12/11/2017 10/28/2017 07/16/2017 06/17/2016  Decreased Interest 0 0 0 0 0  Down, Depressed, Hopeless 0 0 0 0 0  PHQ - 2 Score 0 0 0 0 0     Relevant past medical, surgical, family and social history reviewed and updated as indicated.  Interim medical history since our last visit reviewed. Allergies and medications reviewed and updated.  ROS:  Review of Systems  Constitutional: Negative for fever.  Respiratory: Negative for shortness of breath.   Cardiovascular: Negative for chest pain.  Musculoskeletal: Positive for arthralgias.  Skin: Negative for rash.     Social History   Tobacco Use  Smoking Status Never Smoker  Smokeless Tobacco Never Used       Objective:     Wt Readings from Last 3 Encounters:  03/25/18 271 lb (122.9 kg)  03/17/18 258 lb (117 kg)  02/11/18 269 lb (122 kg)     Exam deferred. Pt. Harboring due to COVID 19. Phone visit performed.   Assessment & Plan:   1. Essential hypertension   2. Localized edema   3. Cervical myelopathy (HCC)     Meds ordered this encounter  Medications  . nabumetone (RELAFEN) 500 MG tablet    Sig: Take 2 tablets (1,000 mg total) by mouth 2 (two) times daily. For muscle and joint pain    Dispense:  360 tablet    Refill:  1  . metoprolol succinate (TOPROL-XL) 100 MG 24 hr tablet    Sig: Take 1 tablet (100 mg total) by mouth daily. Take with or immediately following a meal.    Dispense:  90 tablet    Refill:  1    Needs to be seen  . amLODipine (NORVASC) 2.5 MG tablet    Sig: Take 1 tablet (2.5 mg total)  by mouth daily. (Needs to be seen before next refill)    Dispense:  30 tablet    Refill:  2  . DISCONTD: Eszopiclone (ESZOPICLONE) 3 MG TABS    Sig: Take 1 tablet (3 mg total) by mouth at bedtime. Take immediately before bedtime    Dispense:  90 tablet    Refill:  1  . Eszopiclone (ESZOPICLONE) 3 MG TABS    Sig: Take 1 tablet (3 mg total) by mouth at bedtime. Take immediately before bedtime    Dispense:  90 tablet    Refill:  1    No orders of the defined types were placed in this encounter.     Diagnoses and all orders for this visit:  Essential hypertension  Localized edema  Cervical myelopathy (HCC)  Other orders -     nabumetone (RELAFEN) 500 MG tablet; Take 2 tablets (1,000 mg total) by mouth 2 (two) times daily. For muscle and joint pain -     metoprolol succinate (TOPROL-XL) 100 MG 24 hr tablet; Take 1 tablet (100 mg total) by mouth daily. Take with or immediately following a meal. -     amLODipine (NORVASC) 2.5 MG tablet; Take 1 tablet (2.5 mg total) by mouth  daily. (Needs to be seen before next refill) -     Discontinue: Eszopiclone (ESZOPICLONE) 3 MG TABS; Take 1 tablet (3 mg total) by mouth at bedtime. Take immediately before bedtime -     Eszopiclone (ESZOPICLONE) 3 MG TABS; Take 1 tablet (3 mg total) by mouth at bedtime. Take immediately before bedtime    Virtual Visit via telephone Note  I discussed the limitations, risks, security and privacy concerns of performing an evaluation and management service by telephone and the availability of in person appointments. The patient was identified with two identifiers. Pt.expressed understanding and agreed to proceed. Pt. Is at home. Dr. Livia Snellen is in his office.  Follow Up Instructions:   I discussed the assessment and treatment plan with the patient. The patient was provided an opportunity to ask questions and all were answered. The patient agreed with the plan and demonstrated an understanding of the instructions.    The patient was advised to call back or seek an in-person evaluation if the symptoms worsen or if the condition fails to improve as anticipated.   Total minutes including chart review and phone contact time:22   Follow up plan: Return in about 6 months (around 03/03/2019).  Claretta Fraise, MD [place]

## 2018-09-12 ENCOUNTER — Other Ambulatory Visit: Payer: Self-pay | Admitting: Family Medicine

## 2018-10-21 ENCOUNTER — Other Ambulatory Visit: Payer: Self-pay | Admitting: Family Medicine

## 2018-11-04 ENCOUNTER — Ambulatory Visit (INDEPENDENT_AMBULATORY_CARE_PROVIDER_SITE_OTHER): Admitting: Family Medicine

## 2018-11-04 ENCOUNTER — Other Ambulatory Visit: Payer: Self-pay

## 2018-11-04 DIAGNOSIS — Z20828 Contact with and (suspected) exposure to other viral communicable diseases: Secondary | ICD-10-CM | POA: Diagnosis not present

## 2018-11-04 DIAGNOSIS — Z20822 Contact with and (suspected) exposure to covid-19: Secondary | ICD-10-CM

## 2018-11-04 NOTE — Progress Notes (Signed)
    Subjective:    Patient ID: [name] [name], male    DOB: 07-Apr-1965, 53 y.o.   MRN: [id number]   HPI: [name] [name] is a 53 y.o. male presenting for possible exposure to [place]. E has bee quarantined for 2 weeks. No symptoms. Now requesting test to verify. He has children in the home he does not want to expose. He was quarantining at his fathers house and wants to return home. Denies fever, dyspnea, cough.   Depression screen [facility] 2/9 02/11/2018 12/11/2017 10/28/2017 07/16/2017 06/17/2016  Decreased Interest 0 0 0 0 0  Down, Depressed, Hopeless 0 0 0 0 0  PHQ - 2 Score 0 0 0 0 0     Relevant past medical, surgical, family and social history reviewed and updated as indicated.  Interim medical history since our last visit reviewed. Allergies and medications reviewed and updated.  ROS:  Review of Systems  Constitutional: Negative for fever.  Respiratory: Negative for shortness of breath.   Cardiovascular: Negative for chest pain.  Gastrointestinal: Negative for abdominal pain, diarrhea, nausea and vomiting.  Musculoskeletal: Negative for arthralgias.  Skin: Negative for rash.     Social History   Tobacco Use  Smoking Status Never Smoker  Smokeless Tobacco Never Used       Objective:     Wt Readings from Last 3 Encounters:  03/25/18 271 lb (122.9 kg)  03/17/18 258 lb (117 kg)  02/11/18 269 lb (122 kg)     Exam deferred. Pt. Harboring due to COVID 19. Phone visit performed.   Assessment & Plan:   1. Exposure to Covid-19 Virus     No orders of the defined types were placed in this encounter.   No orders of the defined types were placed in this encounter.     Diagnoses and all orders for this visit:  Exposure to Covid-19 Virus -     Cancel: Novel Coronavirus, NAA ([facility])    Virtual Visit via telephone Note  I discussed the limitations, risks, security and privacy concerns of performing an evaluation and management service by telephone and the availability of  in person appointments. The patient was identified with two identifiers. Pt.expressed understanding and agreed to proceed. Pt. Is at home. Dr. Livia Snellen is in his office.  Follow Up Instructions:   I discussed the assessment and treatment plan with the patient. The patient was provided an opportunity to ask questions and all were answered. The patient agreed with the plan and demonstrated an understanding of the instructions.   The patient was advised to call back or seek an in-person evaluation if the symptoms worsen or if the condition fails to improve as anticipated.   Total minutes including chart review and phone contact time: 12   Follow up plan: No follow-ups on file.  Claretta Fraise, MD [place]

## 2018-11-05 ENCOUNTER — Other Ambulatory Visit: Payer: Self-pay

## 2018-11-05 ENCOUNTER — Encounter: Payer: Self-pay | Admitting: Family Medicine

## 2018-11-05 DIAGNOSIS — Z20822 Contact with and (suspected) exposure to covid-19: Secondary | ICD-10-CM

## 2018-11-07 LAB — NOVEL CORONAVIRUS, NAA: SARS-CoV-2, NAA: NOT DETECTED

## 2018-11-11 ENCOUNTER — Other Ambulatory Visit: Payer: Self-pay | Admitting: Family Medicine

## 2018-11-27 ENCOUNTER — Other Ambulatory Visit: Payer: Self-pay | Admitting: Family Medicine

## 2018-12-24 ENCOUNTER — Other Ambulatory Visit: Payer: Self-pay | Admitting: Family Medicine

## 2019-03-02 ENCOUNTER — Other Ambulatory Visit: Payer: Self-pay | Admitting: Family Medicine

## 2019-03-03 ENCOUNTER — Encounter: Admitting: Family Medicine

## 2019-03-05 ENCOUNTER — Other Ambulatory Visit: Payer: Self-pay | Admitting: Family Medicine

## 2019-03-17 ENCOUNTER — Other Ambulatory Visit: Payer: Self-pay

## 2019-03-18 ENCOUNTER — Other Ambulatory Visit: Payer: Self-pay

## 2019-03-18 ENCOUNTER — Ambulatory Visit (INDEPENDENT_AMBULATORY_CARE_PROVIDER_SITE_OTHER): Admitting: Family Medicine

## 2019-03-18 ENCOUNTER — Encounter: Payer: Self-pay | Admitting: Family Medicine

## 2019-03-18 VITALS — BP 132/83 | HR 60 | Temp 98.4°F | Resp 20 | Ht 73.0 in | Wt 261.0 lb

## 2019-03-18 DIAGNOSIS — Z1211 Encounter for screening for malignant neoplasm of colon: Secondary | ICD-10-CM

## 2019-03-18 DIAGNOSIS — Z0000 Encounter for general adult medical examination without abnormal findings: Secondary | ICD-10-CM

## 2019-03-18 DIAGNOSIS — Z23 Encounter for immunization: Secondary | ICD-10-CM

## 2019-03-18 DIAGNOSIS — Z125 Encounter for screening for malignant neoplasm of prostate: Secondary | ICD-10-CM | POA: Diagnosis not present

## 2019-03-18 DIAGNOSIS — I10 Essential (primary) hypertension: Secondary | ICD-10-CM

## 2019-03-18 DIAGNOSIS — Z0001 Encounter for general adult medical examination with abnormal findings: Secondary | ICD-10-CM | POA: Diagnosis not present

## 2019-03-18 DIAGNOSIS — M5432 Sciatica, left side: Secondary | ICD-10-CM | POA: Diagnosis not present

## 2019-03-18 DIAGNOSIS — E559 Vitamin D deficiency, unspecified: Secondary | ICD-10-CM

## 2019-03-18 MED ORDER — PREDNISONE 10 MG PO TABS: ORAL_TABLET | ORAL | 0 refills | Status: DC

## 2019-03-18 MED ORDER — BETAMETHASONE SOD PHOS & ACET 6 (3-3) MG/ML IJ SUSP
6.0000 mg | Freq: Once | INTRAMUSCULAR | Status: AC
  Administered 2019-03-18: 6 mg via INTRAMUSCULAR

## 2019-03-18 NOTE — Progress Notes (Signed)
Subjective:  Patient ID: [name] [name], male    DOB: 31-Oct-1965  Age: 54 y.o. MRN: [id number]  CC: Annual Exam (CPE)   HPI [name] [name] presents for Annual physical as well as pain radiating from left lumbar back - pointing to l3 region- to sciatic notch and down the left hamstring. Pain is moderately severe. Interferes in routine activities. No relief with the nabumetone or with heat & ice.   Depression screen [facility] 2/9 03/18/2019 02/11/2018 12/11/2017  Decreased Interest 0 0 0  Down, Depressed, Hopeless 0 0 0  PHQ - 2 Score 0 0 0    History [name] [name] has a past medical history of Gout, Medical history non-contributory, and Wears glasses.   He has a past surgical history that includes Wisdom tooth extraction and Knee arthroscopy with patellar tendon repair (Left, 09/03/2012).   His family history includes Cancer in his mother; Diabetes in his father; Heart attack in his father; Hypertension in his father.He reports that he has never smoked. He has never used smokeless tobacco. He reports current alcohol use. He reports that he does not use drugs.    ROS Review of Systems  Constitutional: Negative for activity change, fatigue and unexpected weight change.  HENT: Negative for congestion, ear pain, hearing loss, postnasal drip and trouble swallowing.   Eyes: Negative for pain and visual disturbance.  Respiratory: Negative for cough, chest tightness and shortness of breath.   Cardiovascular: Negative for chest pain, palpitations and leg swelling.  Gastrointestinal: Negative for abdominal distention, abdominal pain, blood in stool, constipation, diarrhea, nausea and vomiting.  Endocrine: Negative for cold intolerance, heat intolerance and polydipsia.  Genitourinary: Negative for difficulty urinating, dysuria, flank pain, frequency and urgency.  Musculoskeletal: Negative for arthralgias and joint swelling.  Skin: Negative for color change, rash and wound.  Neurological: Negative for dizziness,  syncope, speech difficulty, weakness, light-headedness, numbness and headaches.  Hematological: Does not bruise/bleed easily.  Psychiatric/Behavioral: Negative for confusion, decreased concentration, dysphoric mood and sleep disturbance. The patient is not nervous/anxious.     Objective:  BP 132/83 (BP Location: Right Arm, Cuff Size: Large)   Pulse 60   Temp 98.4 F (36.9 C)   Resp 20   Ht 6\' 1"  (1.854 m)   Wt 261 lb (118.4 kg)   SpO2 96%   BMI 34.43 kg/m   BP Readings from Last 3 Encounters:  03/18/19 132/83  03/25/18 135/77  03/17/18 137/88    Wt Readings from Last 3 Encounters:  03/18/19 261 lb (118.4 kg)  03/25/18 271 lb (122.9 kg)  03/17/18 258 lb (117 kg)     Physical Exam Constitutional:      Appearance: He is well-developed.  HENT:     Head: Normocephalic and atraumatic.  Eyes:     Pupils: Pupils are equal, round, and reactive to light.  Neck:     Thyroid: No thyromegaly.     Trachea: No tracheal deviation.  Cardiovascular:     Rate and Rhythm: Normal rate and regular rhythm.     Heart sounds: Normal heart sounds. No murmur. No friction rub. No gallop.   Pulmonary:     Breath sounds: Normal breath sounds. No wheezing or rales.  Abdominal:     General: Bowel sounds are normal. There is no distension.     Palpations: Abdomen is soft. There is no mass.     Tenderness: There is no abdominal tenderness.     Hernia: There is no hernia in the left inguinal area.  Genitourinary:    Penis: Normal.      Testes: Normal.  Musculoskeletal:        General: Tenderness (left sciatic notch, positive SLR at 30 degrees) present. Normal range of motion.     Cervical back: Normal range of motion.  Lymphadenopathy:     Cervical: No cervical adenopathy.  Skin:    General: Skin is warm and dry.  Neurological:     Mental Status: He is alert and oriented to person, place, and time.       Assessment & Plan:   Narada was seen today for annual exam.  Diagnoses and all  orders for this visit:  Well adult exam -     CBC -     CMP -     Lipid -     Urinalysis- dip only  Screening for prostate cancer -     CBC -     CMP -     PSA Total (Reflex To Free)  Vitamin D deficiency -     CBC -     CMP -     VITAMIN D  Screen for colon cancer -     CBC -     CMP -     Colonoscopy  Essential hypertension -     CBC -     CMP -     Lipid  Sciatica of left side -     CBC -     CMP -     Ortho -     betamethasone acetate-betamethasone sodium phosphate (CELESTONE) injection 6 mg -     Ortho  Need for tetanus booster -     Td : Tetanus/diphtheria >7yo Preservative  free  Need for immunization against influenza -     Flu Vaccine QUAD 36+ mos IM  Other orders -     predniSONE (DELTASONE) 10 MG tablet; Take 5 daily for 3 days followed by 4,3,2 and 1 for 3 days each.       I have discontinued Shaune Spittle. [name] [name]'s traMADol. I am also having him start on predniSONE. Additionally, I am having him maintain his cyclobenzaprine, nabumetone, Eszopiclone, allopurinol, metoprolol succinate, and amLODipine. We administered betamethasone acetate-betamethasone sodium phosphate.  Allergies as of 03/18/2019      Reactions   Sulfa Antibiotics    Unknown-was told that      Medication List       Accurate as of March 18, 2019  3:03 PM. If you have any questions, ask your nurse or doctor.        STOP taking these medications   traMADol 50 MG tablet Commonly known as: ULTRAM Stopped by: Claretta Fraise, MD     TAKE these medications   allopurinol 300 MG tablet Commonly known as: ZYLOPRIM TAKE 1 TABLET AT BEDTIME   amLODipine 2.5 MG tablet Commonly known as: NORVASC TAKE 1 TABLET(2.5 MG) BY MOUTH DAILY   cyclobenzaprine 10 MG tablet Commonly known as: FLEXERIL Take 1 tablet (10 mg total) by mouth 3 (three) times daily as needed for muscle spasms.   Eszopiclone 3 MG Tabs Commonly known as: eszopiclone Take 1 tablet (3 mg total) by mouth at bedtime.  Take immediately before bedtime   metoprolol succinate 100 MG 24 hr tablet Commonly known as: TOPROL-XL TAKE 1 TABLET BY MOUTH EVERY DAY WITH OR IMMEDIATEKY FOLLOWING A MEAL   nabumetone 500 MG tablet Commonly known as: RELAFEN Take 2 tablets (1,000 mg total) by mouth 2 (two) times  daily. For muscle and joint pain   predniSONE 10 MG tablet Commonly known as: DELTASONE Take 5 daily for 3 days followed by 4,3,2 and 1 for 3 days each. Started by: Mechele Claude, MD      Referred to orthopedics for definitive care of sciatica. To GI for colonoscopy. He will contine his meds for BP, insomnia, and gout prevention as is.  Follow-up: Return in about 6 months (around 09/15/2019).  Mechele Claude, M.D.

## 2019-03-19 LAB — CBC WITH DIFFERENTIAL/PLATELET
Basophils Absolute: 0 10*3/uL (ref 0.0–0.2)
Basos: 1 %
EOS (ABSOLUTE): 0 10*3/uL (ref 0.0–0.4)
Eos: 0 %
Hematocrit: 40.7 % (ref 37.5–51.0)
Hemoglobin: 14.5 g/dL (ref 13.0–17.7)
Immature Grans (Abs): 0 10*3/uL (ref 0.0–0.1)
Immature Granulocytes: 0 %
Lymphocytes Absolute: 1.4 10*3/uL (ref 0.7–3.1)
Lymphs: 21 %
MCH: 30 pg (ref 26.6–33.0)
MCHC: 35.6 g/dL (ref 31.5–35.7)
MCV: 84 fL (ref 79–97)
Monocytes Absolute: 0.7 10*3/uL (ref 0.1–0.9)
Monocytes: 10 %
Neutrophils Absolute: 4.5 10*3/uL (ref 1.4–7.0)
Neutrophils: 68 %
Platelets: 214 10*3/uL (ref 150–450)
RBC: 4.83 x10E6/uL (ref 4.14–5.80)
RDW: 12.7 % (ref 11.6–15.4)
WBC: 6.6 10*3/uL (ref 3.4–10.8)

## 2019-03-19 LAB — LIPID PANEL
Chol/HDL Ratio: 3.8 ratio (ref 0.0–5.0)
Cholesterol, Total: 192 mg/dL (ref 100–199)
HDL: 50 mg/dL (ref >39)
LDL Chol Calc (NIH): 123 mg/dL — ABNORMAL HIGH (ref 0–99)
Triglycerides: 104 mg/dL (ref 0–149)
VLDL Cholesterol Cal: 19 mg/dL (ref 5–40)

## 2019-03-19 LAB — CMP14+EGFR
ALT: 16 IU/L (ref 0–44)
AST: 19 IU/L (ref 0–40)
Albumin/Globulin Ratio: 2 (ref 1.2–2.2)
Albumin: 4.8 g/dL (ref 3.8–4.9)
Alkaline Phosphatase: 61 IU/L (ref 39–117)
BUN/Creatinine Ratio: 10 (ref 9–20)
BUN: 17 mg/dL (ref 6–24)
Bilirubin Total: 0.7 mg/dL (ref 0.0–1.2)
CO2: 23 mmol/L (ref 20–29)
Calcium: 9.5 mg/dL (ref 8.7–10.2)
Chloride: 102 mmol/L (ref 96–106)
Creatinine, Ser: 1.73 mg/dL — ABNORMAL HIGH (ref 0.76–1.27)
GFR calc Af Amer: 50 mL/min/{1.73_m2} — ABNORMAL LOW (ref >59)
GFR calc non Af Amer: 43 mL/min/{1.73_m2} — ABNORMAL LOW (ref >59)
Globulin, Total: 2.4 g/dL (ref 1.5–4.5)
Glucose: 92 mg/dL (ref 65–99)
Potassium: 4.4 mmol/L (ref 3.5–5.2)
Sodium: 140 mmol/L (ref 134–144)
Total Protein: 7.2 g/dL (ref 6.0–8.5)

## 2019-03-19 LAB — VITAMIN D 25 HYDROXY (VIT D DEFICIENCY, FRACTURES): Vit D, 25-Hydroxy: 16.4 ng/mL — ABNORMAL LOW (ref 30.0–100.0)

## 2019-03-19 LAB — PSA TOTAL (REFLEX TO FREE): Prostate Specific Ag, Serum: 0.9 ng/mL (ref 0.0–4.0)

## 2019-03-21 ENCOUNTER — Encounter: Payer: Self-pay | Admitting: Family Medicine

## 2019-03-22 MED ORDER — VITAMIN D (ERGOCALCIFEROL) 1.25 MG (50000 UNIT) PO CAPS: 50000.0000 [IU] | ORAL_CAPSULE | ORAL | 1 refills | Status: DC

## 2019-03-22 NOTE — Addendum Note (Signed)
Addended by: Austin Miles F on: 03/22/2019 02:02 PM   Modules accepted: Orders

## 2019-03-23 ENCOUNTER — Encounter: Payer: Self-pay | Admitting: Internal Medicine

## 2019-03-25 ENCOUNTER — Ambulatory Visit (INDEPENDENT_AMBULATORY_CARE_PROVIDER_SITE_OTHER): Admitting: Orthopaedic Surgery

## 2019-03-25 ENCOUNTER — Encounter: Payer: Self-pay | Admitting: Orthopaedic Surgery

## 2019-03-25 ENCOUNTER — Ambulatory Visit (INDEPENDENT_AMBULATORY_CARE_PROVIDER_SITE_OTHER)

## 2019-03-25 VITALS — BP 160/102 | HR 71 | Ht 73.0 in | Wt 258.0 lb

## 2019-03-25 DIAGNOSIS — M545 Low back pain, unspecified: Secondary | ICD-10-CM

## 2019-03-25 NOTE — Progress Notes (Signed)
Office Visit Note   Patient: [name] [name]           Date of Birth: 02-06-1966           MRN: [id number] Visit Date: 03/25/2019              Requested by: Mechele Claude, MD [address] [place],  [place] [address] PCP: Mechele Claude, MD   Assessment & Plan: Visit Diagnoses:  1. Acute left-sided low back pain, unspecified whether sciatica present     Plan: Patient is better after the prednisone Dosepak to some degree.  He does not continue to improve he will call and we will set up some outpatient therapy in [place] which is where he is home is located.  We discussed MRI scan if his symptoms persist.  Follow-Up Instructions: No follow-ups on file.   Orders:  Orders Placed This Encounter  Procedures  . XR Lumbar Spine 2-3 Views   No orders of the defined types were placed in this encounter.     Procedures: No procedures performed   Clinical Data: No additional findings.   Subjective: Chief Complaint  Patient presents with  . Lower Back - Pain    HPI 54 year old male retired from [employer] here with intermittent back symptoms which have been severe since March 09, 1999.  He has had some problems in the past with his back but not as severe as this episode.  He was recently placed on prednisone Dosepak he is taking 3 pills daily currently as he weans down.  Is also been on some Relafen.  He denies bowel bladder symptoms no fever chills.  He has been able to ambulate but has trouble getting to full upright position and had been walking with his back somewhat crooked.  Review of Systems positive for some cervical spondylosis with foraminal stenosis C5-6.  Positive history of gout, insomnia, sleep apnea, hypertension and recurrent back pain.  Negative for CVA negative for MI negative for angina.   Objective: Vital Signs: BP (!) 160/102   Pulse 71   Ht 6\' 1"  (1.854 m)   Wt 258 lb (117 kg)   BMI 34.04 kg/m   Physical Exam Constitutional:      Appearance: He is  well-developed.  HENT:     Head: Normocephalic and atraumatic.  Eyes:     Pupils: Pupils are equal, round, and reactive to light.  Neck:     Thyroid: No thyromegaly.     Trachea: No tracheal deviation.  Cardiovascular:     Rate and Rhythm: Normal rate.  Pulmonary:     Effort: Pulmonary effort is normal.     Breath sounds: No wheezing.  Abdominal:     General: Bowel sounds are normal.     Palpations: Abdomen is soft.  Skin:    General: Skin is warm and dry.     Capillary Refill: Capillary refill takes less than 2 seconds.  Neurological:     Mental Status: He is alert and oriented to person, place, and time.  Psychiatric:        Behavior: Behavior normal.        Thought Content: Thought content normal.        Judgment: Judgment normal.     Ortho Exam patient has some pain with straight leg raising.  Sciatic notch tenderness on the left.  No midline defects.  Knee and ankle jerk 1+.  Negative logroll's right and left hip.  Anterior tib gastrocsoleus is intact.  Specialty Comments:  No specialty comments available.  Imaging: XR Lumbar Spine 2-3 Views  Result Date: 03/25/2019 AP lateral lumbar spine x-rays are obtained and reviewed.  This shows disc space narrowing without spondylolisthesis most pronounced at L3-4 and L4-5.  Patient has endplate spurring on AP x-ray and 15 to 20 degree right lumbar curvature with asymmetric disc space narrowing at multiple levels.  Slight narrowing of the left hip joint is noted. Impression: Lumbar spondylosis mid lumbar region.  Radiographs negative for acute changes.    PMFS History: Patient Active Problem List   Diagnosis Date Noted  . Essential hypertension 09/01/2018  . Insomnia 10/23/2015  . Obstructive sleep apnea 01/16/2015  . Gout 01/05/2015  . Elevated blood pressure 01/05/2015   Past Medical History:  Diagnosis Date  . Gout   . Medical history non-contributory   . Wears glasses     Family History  Problem Relation Age of  Onset  . Cancer Mother        Precancerous colon polyps  . Diabetes Father   . Hypertension Father   . Heart attack Father     Past Surgical History:  Procedure Laterality Date  . KNEE ARTHROSCOPY WITH PATELLAR TENDON REPAIR Left 09/03/2012   Procedure: LEFT KNEE ARTHROSCOPY, LOOSE BODY EXCISION, PATELLAR TENDON REPAIR, EXCISION TIBIAL TUBERCLE SPUR, CHONDROPLASTY PATELLA, EXCISION PLICA;  Surgeon: Ninetta Lights, MD;  Location: [facility];  Service: Orthopedics;  Laterality: Left;  . WISDOM TOOTH EXTRACTION     Social History   Occupational History  . Not on file  Tobacco Use  . Smoking status: Never Smoker  . Smokeless tobacco: Never Used  Substance and Sexual Activity  . Alcohol use: Yes    Comment: rare  . Drug use: No  . Sexual activity: Not on file

## 2019-04-15 ENCOUNTER — Other Ambulatory Visit: Payer: Self-pay | Admitting: Family Medicine

## 2019-04-26 ENCOUNTER — Other Ambulatory Visit: Payer: Self-pay | Admitting: Family Medicine

## 2019-04-29 ENCOUNTER — Ambulatory Visit: Admitting: Orthopaedic Surgery

## 2019-05-04 ENCOUNTER — Telehealth: Payer: Self-pay | Admitting: Orthopaedic Surgery

## 2019-05-04 NOTE — Telephone Encounter (Signed)
Patient called checking if we received request for records from [place] for his disability. I advised him we haven't received anything. He has an appt on 2/25. I suggested he sign a release form when he comes in to get records for himself to take or if he can obtain a fax number we can fax to.

## 2019-05-06 ENCOUNTER — Encounter: Payer: Self-pay | Admitting: Orthopaedic Surgery

## 2019-05-06 ENCOUNTER — Ambulatory Visit (INDEPENDENT_AMBULATORY_CARE_PROVIDER_SITE_OTHER): Admitting: Orthopaedic Surgery

## 2019-05-06 DIAGNOSIS — M5136 Other intervertebral disc degeneration, lumbar region: Secondary | ICD-10-CM

## 2019-05-06 NOTE — Progress Notes (Signed)
Office Visit Note   Patient: [name] [name]           Date of Birth: 1965-10-04           MRN: [id number] Visit Date: 05/06/2019              Requested by: Claretta Fraise, MD [place],  [place] [address] PCP: Claretta Fraise, MD   Assessment & Plan: Visit Diagnoses:  1. Other intervertebral disc degeneration, lumbar region     Plan: will proceed with PT in [place].  ROV 6 weeks.  If he does not get improvement we could consider MRI imaging.  His plain radiographs did show some L3-4 and L4-5 disc space narrowing.  Recheck 6 weeks.  Follow-Up Instructions: Return in about 6 weeks (around 06/17/2019).   Orders:  No orders of the defined types were placed in this encounter.  No orders of the defined types were placed in this encounter.     Procedures: No procedures performed   Clinical Data: No additional findings.   Subjective: Chief Complaint  Patient presents with  . Lower Back - Pain, Follow-up    HPI 54 year old male returns from the [employer] returns with his persistent problems with back pain left leg pain.  Pain radiates from his back into his posterior hamstring stops at the knee does not extend down to the foot.  He got better with a prednisone Dosepak.  He did not call about therapy will we have previously discussed it.  At times he has difficulty getting upright he rates his symptoms is mild to medium.  No associated bowel bladder symptoms he does have a history of gout condition sleep apnea hypertension.  Review of Systems 14 point system update unchanged from 03/25/2019.   Objective: Vital Signs: BP (!) 156/91   Pulse 73   Ht 6\' 1"  (1.854 m)   Wt 255 lb (115.7 kg)   BMI 33.64 kg/m   Physical Exam Constitutional:      Appearance: He is well-developed.  HENT:     Head: Normocephalic and atraumatic.  Eyes:     Pupils: Pupils are equal, round, and reactive to light.  Neck:     Thyroid: No thyromegaly.     Trachea: No tracheal deviation.    Cardiovascular:     Rate and Rhythm: Normal rate.  Pulmonary:     Effort: Pulmonary effort is normal.     Breath sounds: No wheezing.  Abdominal:     General: Bowel sounds are normal.     Palpations: Abdomen is soft.  Skin:    General: Skin is warm and dry.     Capillary Refill: Capillary refill takes less than 2 seconds.  Neurological:     Mental Status: He is alert and oriented to person, place, and time.  Psychiatric:        Behavior: Behavior normal.        Thought Content: Thought content normal.        Judgment: Judgment normal.     Ortho Exam Neg SLR, neg Popliteal compression test bilat. AT, GS 5/5. Neg hip logroll. No rash.  Normal heel toe gait without limp.   Specialty Comments:  No specialty comments available.  Imaging: No results found.   PMFS History: Patient Active Problem List   Diagnosis Date Noted  . Other intervertebral disc degeneration, lumbar region 05/06/2019  . Essential hypertension 09/01/2018  . Insomnia 10/23/2015  . Obstructive sleep apnea 01/16/2015  . Gout 01/05/2015  .  Elevated blood pressure 01/05/2015   Past Medical History:  Diagnosis Date  . Gout   . Medical history non-contributory   . Wears glasses     Family History  Problem Relation Age of Onset  . Cancer Mother        Precancerous colon polyps  . Diabetes Father   . Hypertension Father   . Heart attack Father     Past Surgical History:  Procedure Laterality Date  . KNEE ARTHROSCOPY WITH PATELLAR TENDON REPAIR Left 09/03/2012   Procedure: LEFT KNEE ARTHROSCOPY, LOOSE BODY EXCISION, PATELLAR TENDON REPAIR, EXCISION TIBIAL TUBERCLE SPUR, CHONDROPLASTY PATELLA, EXCISION PLICA;  Surgeon: Loreta Ave, MD;  Location: [facility];  Service: Orthopedics;  Laterality: Left;  . WISDOM TOOTH EXTRACTION     Social History   Occupational History  . Not on file  Tobacco Use  . Smoking status: Never Smoker  . Smokeless tobacco: Never Used  Substance and  Sexual Activity  . Alcohol use: Yes    Comment: rare  . Drug use: No  . Sexual activity: Not on file

## 2019-05-11 ENCOUNTER — Ambulatory Visit (HOSPITAL_COMMUNITY): Attending: Orthopaedic Surgery | Admitting: Physical Therapy

## 2019-05-11 ENCOUNTER — Encounter (HOSPITAL_COMMUNITY): Payer: Self-pay | Admitting: Physical Therapy

## 2019-05-11 ENCOUNTER — Other Ambulatory Visit: Payer: Self-pay

## 2019-05-11 DIAGNOSIS — M5416 Radiculopathy, lumbar region: Secondary | ICD-10-CM

## 2019-05-11 DIAGNOSIS — M6281 Muscle weakness (generalized): Secondary | ICD-10-CM | POA: Diagnosis present

## 2019-05-11 NOTE — Therapy (Signed)
[facility] [address] [place], [place], [address] Phone: [phone]   Fax:  [phone]  Physical Therapy Evaluation  Patient Details  Name: [name] [name] MRN: [id number] Date of Birth: 1965/08/03 Referring Provider (PT): Rodell Perna    Encounter Date: 05/11/2019  PT End of Session - 05/11/19 1101    Visit Number  1    Number of Visits  15    Date for PT Re-Evaluation  06/22/19    Authorization Type  [employer] 15 limitation    Authorization - Visit Number  1    Authorization - Number of Visits  15    Progress Note Due on Visit  10    PT Start Time  0845    PT Stop Time  0915    PT Time Calculation (min)  30 min    Activity Tolerance  Patient tolerated treatment well    Behavior During Therapy  [facility] for tasks assessed/performed       Past Medical History:  Diagnosis Date  . Gout   . Medical history non-contributory   . Wears glasses     Past Surgical History:  Procedure Laterality Date  . KNEE ARTHROSCOPY WITH PATELLAR TENDON REPAIR Left 09/03/2012   Procedure: LEFT KNEE ARTHROSCOPY, LOOSE BODY EXCISION, PATELLAR TENDON REPAIR, EXCISION TIBIAL TUBERCLE SPUR, CHONDROPLASTY PATELLA, EXCISION PLICA;  Surgeon: Ninetta Lights, MD;  Location: [place];  Service: Orthopedics;  Laterality: Left;  . WISDOM TOOTH EXTRACTION      There were no vitals filed for this visit.   Subjective Assessment - 05/11/19 0853    Subjective  [name] states that he has had low back pain that comes and goes for years.   The latest episode started mid December 2020.  He states the latest episode began after he was splitting wood.  He was placed on prednisone which improved his pain but he is still experiencing discomfort therefore his MD referred him to therapy.    Pertinent History  arthroscopic surgery, HTN, sleep apnea, chronic neck and back pain    Limitations  Sitting;Standing;Walking;Lifting;House hold activities    How long can  you sit comfortably?  10-15 minutes    How long can you stand comfortably?  5 minutes n    How long can you walk comfortably?  increased pain after 15 minutes    Patient Stated Goals  less pain    Currently in Pain?  Yes    Pain Score  5    worst in the past week 7/ best 3   Pain Location  Back    Pain Orientation  Lower    Pain Descriptors / Indicators  Aching;Tightness    Pain Type  Chronic pain    Pain Radiating Towards  Lt mid thigh    Pain Onset  More than a month ago    Pain Frequency  Constant    Aggravating Factors   sitting , activity    Pain Relieving Factors  predinsone    Effect of Pain on Daily Activities  limits         [facility] PT Assessment - 05/11/19 0001      Assessment   Medical Diagnosis  LT lumbar radiculopathy     Referring Provider (PT)  Rodell Perna     Onset Date/Surgical Date  02/23/19   this episode   Next MD Visit  06/17/2019    Prior Therapy  no      Precautions  Precautions  None      Balance Screen   Has the patient fallen in the past 6 months  Yes    How many times?  1   slipped down steps    Has the patient had a decrease in activity level because of a fear of falling?   Yes    Is the patient reluctant to leave their home because of a fear of falling?   No      Prior Function   Level of Independence  Independent    Vocation  Retired    Leisure  fishing, reading       Cognition   Overall Cognitive Status  Within Functional Limits for tasks assessed      Observation/Other Assessments   Focus on Therapeutic Outcomes (FOTO)   54; 46% affected       Functional Tests   Functional tests  Single leg stance;Sit to Stand      Single Leg Stance   Comments  60" B       Posture/Postural Control   Posture/Postural Control  No significant limitations    Posture Comments  protruding abdominal       ROM / Strength   AROM / PROM / Strength  AROM;Strength      AROM   AROM Assessment Site  Lumbar    Lumbar Flexion  fingers 5 inches from floor    flat back pt moving from hips; back pain increases leg same    Lumbar Extension  15   increases back pain not leg      Strength   Strength Assessment Site  Hip;Knee;Ankle    Right/Left Hip  Right;Left    Right Hip Flexion  4/5    Right Hip Extension  4-/5    Right Hip ABduction  5/5    Left Hip Flexion  3+/5    Left Hip Extension  3+/5    Left Hip ABduction  5/5    Right/Left Knee  Right;Left    Right Knee Flexion  5/5    Right Knee Extension  5/5    Left Knee Flexion  3+/5    Left Knee Extension  5/5    Right/Left Ankle  Right;Left    Right Ankle Dorsiflexion  5/5    Left Ankle Dorsiflexion  4/5                Objective measurements completed on examination: See above findings.              PT Education - 05/11/19 1058    Education Details  reviewed HEP no time for pt to actually complete therefore will need to be reviewed.    Person(s) Educated  Patient    Methods  Explanation;Handout    Comprehension  Verbalized understanding       PT Short Term Goals - 05/11/19 1310      PT SHORT TERM GOAL #1   Title  Pt to be I in HEP to improve back and hip motion to be able to bend and reach the floor to allow pt to pick items off the ground in his yard.    Time  3    Period  Weeks    Status  New    Target Date  06/02/19      PT SHORT TERM GOAL #2   Title  PT to have increased knowledge and be using improved body mechanics for bed mobility and lifting  to have no radiating  sx to demonstrate decreased nerve root irritation.    Time  3    Period  Weeks    Status  New        PT Long Term Goals - 05/11/19 1312      PT LONG TERM GOAL #1   Title  Pt to be I in advance HEP to be able to decrease low back pain to no greater than a 2/10 .    Time  6    Period  Weeks    Status  New    Target Date  06/22/19      PT LONG TERM GOAL #2   Title  PT to be able to sit, stand or walk for over 30 mintues at a time to improve his functional ability for yard and  housework.    Time  6    Period  Weeks    Status  New      PT LONG TERM GOAL #3   Title  PT LE strength to be at least 4+ /5 to increase the ease of standing up from a low lying soft area such as a couch without the use of his UE>    Time  6    Period  Weeks             Plan - 05/11/19 1302    Clinical Impression Statement  Mr. Hanad is a 54 yo who has has periodic radiating low back pain for several years.  His last acute exacerbation occured in December after he had been splitting wood. He was precribed prednisone which has improved his pain, however he is still having pain and stiffness therefore he has been referred to skilled PT.  Evaluation demonstrates decreased ROM, decreased strength, decreased knowledge of proper bed mobility/body mechanics, increased pain and tight lumbar mm. Mr. Arvil will benefit from skilled PT to address these issues and maximize her functional ability.    Examination-Activity Limitations  Bend;Carry;Squat;Sit;Lift;Stand;Locomotion Level    Examination-Participation Restrictions  Yard Work;Community Activity    Stability/Clinical Decision Making  Stable/Uncomplicated    Clinical Decision Making  Low    Rehab Potential  Good    PT Frequency  2x / week    PT Duration  6 weeks    PT Treatment/Interventions  Therapeutic exercise;Therapeutic activities;Balance training;Functional mobility training;Patient/family education;Joint Manipulations;Dry needling;Manual techniques    PT Next Visit Plan  begin 3-D trunk rotation, abset with bridges,  madcat old horse, and manual    PT Home Exercise Plan  eval: standing extension, knee to chest, active hamstring stretch and POE       Patient will benefit from skilled therapeutic intervention in order to improve the following deficits and impairments:  Decreased mobility, Decreased range of motion, Decreased strength, Impaired flexibility, Pain, Improper body mechanics  Visit Diagnosis: Radiculopathy, lumbar region -  Plan: PT plan of care cert/re-cert  Muscle weakness (generalized) - Plan: PT plan of care cert/re-cert     Problem List Patient Active Problem List   Diagnosis Date Noted  . Other intervertebral disc degeneration, lumbar region 05/06/2019  . Essential hypertension 09/01/2018  . Insomnia 10/23/2015  . Obstructive sleep apnea 01/16/2015  . Gout 01/05/2015  . Elevated blood pressure 01/05/2015    Virgina Organ, PT CLT [phone] 05/11/2019, 1:17 PM  [facility] [address] [place], [place], [address] Phone: [phone]   Fax:  [phone]  Name: Duquan J [place] MRN: [id number] Date of Birth: March 07, 1966

## 2019-05-12 ENCOUNTER — Other Ambulatory Visit: Payer: Self-pay

## 2019-05-12 ENCOUNTER — Encounter: Payer: Self-pay | Admitting: Nurse Practitioner

## 2019-05-12 ENCOUNTER — Ambulatory Visit (INDEPENDENT_AMBULATORY_CARE_PROVIDER_SITE_OTHER): Admitting: Nurse Practitioner

## 2019-05-12 DIAGNOSIS — Z800 Family history of malignant neoplasm of digestive organs: Secondary | ICD-10-CM

## 2019-05-12 MED ORDER — PEG 3350-KCL-NA BICARB-NACL 420 G PO SOLR: 4000.0000 mL | ORAL | 0 refills | Status: DC

## 2019-05-12 NOTE — Progress Notes (Signed)
Primary Care Physician:  Claretta Fraise, MD Primary Gastroenterologist:  Dr.   Laurel Dimmer Complaint  Patient presents with  . Consult    last TCS 2015 in [place]. Mom had polps removed    HPI:   [name] [name] is a 54 y.o. male who presents on referral from primary care for colonoscopy.  Nurse/phone triage was deferred office visit due to medications likely necessitating augmented sedation.  Reviewed information provided with referral including well adult exam on 03/18/2019.  Routine labs were completed.  Recommended screening for colon cancer.  No history of colonoscopy in our system.  Today he states he's doing ok overall. Last colonoscopy 2015 in [place], [place] which the patient states he cannot remember what was found, but thinks possibly some polyps. He states family history of CRC in his mother (but per his description unsure if CRC vs. Possibly advanced polyps). Denies abdominal pain, N/V, hematochezia, melena, fever, chills, unintentional weight los. Denies URI or flu-like symptoms. Denies loss of sense of taste or smell. Has been tested for COVID-19 in August 2020 which was negative. Denies chest pain, dyspnea, dizziness, lightheadedness, syncope, near syncope. Denies any other upper or lower GI symptoms.  Past Medical History:  Diagnosis Date  . Gout   . Hypertension   . Medical history non-contributory   . Wears glasses     Past Surgical History:  Procedure Laterality Date  . KNEE ARTHROSCOPY WITH PATELLAR TENDON REPAIR Left 09/03/2012   Procedure: LEFT KNEE ARTHROSCOPY, LOOSE BODY EXCISION, PATELLAR TENDON REPAIR, EXCISION TIBIAL TUBERCLE SPUR, CHONDROPLASTY PATELLA, EXCISION PLICA;  Surgeon: Ninetta Lights, MD;  Location: [place];  Service: Orthopedics;  Laterality: Left;  . WISDOM TOOTH EXTRACTION      Current Outpatient Medications  Medication Sig Dispense Refill  . allopurinol (ZYLOPRIM) 300 MG tablet TAKE 1 TABLET AT BEDTIME 90 tablet 3  . amLODipine  (NORVASC) 2.5 MG tablet TAKE 1 TABLET(2.5 MG) BY MOUTH DAILY 30 tablet 2  . Eszopiclone 3 MG TABS TAKE 1 TABLET BY MOUTH EVERY NIGHT AT BEDTIME 90 tablet 1  . metoprolol succinate (TOPROL-XL) 100 MG 24 hr tablet TAKE 1 TABLET BY MOUTH EVERY DAY WITH OR IMMEDIATEKY FOLLOWING A MEAL 90 tablet 0  . nabumetone (RELAFEN) 500 MG tablet TAKE 2 TABLETS(1000 MG) BY MOUTH MUSCLE TWICE DAILY FOR JOINT PAIN 360 tablet 0  . Vitamin D, Ergocalciferol, (DRISDOL) 1.25 MG (50000 UNIT) CAPS capsule Take 1 capsule (50,000 Units total) by mouth every 7 (seven) days. 13 capsule 1   No current facility-administered medications for this visit.    Allergies as of 05/12/2019 - Review Complete 05/12/2019  Allergen Reaction Noted  . Sulfa antibiotics  08/06/2012    Family History  Problem Relation Age of Onset  . Colon cancer Mother        Precancerous colon polyps; unsure of actual CRC?  . Diabetes Father   . Hypertension Father   . Heart attack Father     Social History   Socioeconomic History  . Marital status: Married    Spouse name: Not on file  . Number of children: Not on file  . Years of education: Not on file  . Highest education level: Not on file  Occupational History  . Not on file  Tobacco Use  . Smoking status: Never Smoker  . Smokeless tobacco: Never Used  Substance and Sexual Activity  . Alcohol use: Yes    Comment: rare  . Drug use: No  .  Sexual activity: Not on file  Other Topics Concern  . Not on file  Social History Narrative  . Not on file   Social Determinants of Health   Financial Resource Strain:   . Difficulty of Paying Living Expenses: Not on file  Food Insecurity:   . Worried About Programme researcher, broadcasting/film/video in the Last Year: Not on file  . Ran Out of Food in the Last Year: Not on file  Transportation Needs:   . Lack of Transportation (Medical): Not on file  . Lack of Transportation (Non-Medical): Not on file  Physical Activity:   . Days of Exercise per Week: Not on  file  . Minutes of Exercise per Session: Not on file  Stress:   . Feeling of Stress : Not on file  Social Connections:   . Frequency of Communication with Friends and Family: Not on file  . Frequency of Social Gatherings with Friends and Family: Not on file  . Attends Religious Services: Not on file  . Active Member of Clubs or Organizations: Not on file  . Attends Banker Meetings: Not on file  . Marital Status: Not on file  Intimate Partner Violence:   . Fear of Current or Ex-Partner: Not on file  . Emotionally Abused: Not on file  . Physically Abused: Not on file  . Sexually Abused: Not on file    Review of Systems: General: Negative for anorexia, weight loss, fever, chills, fatigue, weakness. ENT: Negative for hoarseness, difficulty swallowing. CV: Negative for chest pain, angina, palpitations, peripheral edema.  Respiratory: Negative for dyspnea at rest, cough, sputum, wheezing.  GI: See history of present illness. MS: Negative for joint pain, low back pain.  Derm: Negative for rash or itching.  Endo: Negative for unusual weight change.  Heme: Negative for bruising or bleeding. Allergy: Negative for rash or hives.    Physical Exam: BP (!) 150/93   Pulse 71   Temp (!) 97 F (36.1 C) (Oral)   Ht 6\' 1"  (1.854 m)   Wt 264 lb 9.6 oz (120 kg)   BMI 34.91 kg/m  General:   Alert and oriented. Pleasant and cooperative. Well-nourished and well-developed.  Head:  Normocephalic and atraumatic. Eyes:  Without icterus, sclera clear and conjunctiva pink.  Ears:  Normal auditory acuity. Cardiovascular:  S1, S2 present without murmurs appreciated. Extremities without clubbing or edema. Respiratory:  Clear to auscultation bilaterally. No wheezes, rales, or rhonchi. No distress.  Gastrointestinal:  +BS, soft, non-tender and non-distended. No HSM noted. No guarding or rebound. No masses appreciated.  Rectal:  Deferred  Musculoskalatal:  Symmetrical without gross  deformities. Neurologic:  Alert and oriented x4;  grossly normal neurologically. Psych:  Alert and cooperative. Normal mood and affect. Heme/Lymph/Immune: No excessive bruising noted.    05/12/2019 9:44 AM   Disclaimer: This note was dictated with voice recognition software. Similar sounding words can inadvertently be transcribed and may not be corrected upon review.

## 2019-05-12 NOTE — Patient Instructions (Signed)
Your health issues we discussed today were:   Need for colonoscopy: 1. We will schedule your colonoscopy for you 2. Further recommendations will follow your colonoscopy  Overall I recommend:  1. Continue your other current medications 2. Return for follow-up based on recommendations made after your colonoscopy 3. Call us if you have any GI symptoms 4. Call us if you have any questions or concerns   ---------------------------------------------------------------  COVID-19 Vaccine Information can be found at: [email or web address] For questions related to vaccine distribution or appointments, please email [email or web address] or call [phone].   ---------------------------------------------------------------   At [facility] we value your feedback. You may receive a survey about your visit today. Please share your experience as we strive to create trusting relationships with our patients to provide genuine, compassionate, quality care.  We appreciate your understanding and patience as we review any laboratory studies, imaging, and other diagnostic tests that are ordered as we care for you. Our office policy is 5 business days for review of these results, and any emergent or urgent results are addressed in a timely manner for your best interest. If you do not hear from our office in 1 week, please contact us.   We also encourage the use of MyChart, which contains your medical information for your review as well. If you are not enrolled in this feature, an access code is on this after visit summary for your convenience. Thank you for allowing [place] to be involved in your care.  It was great to see you today!  I hope you have a great day!!

## 2019-05-12 NOTE — Assessment & Plan Note (Signed)
The patient describes a family history of colon cancer.  However, based on his description I am not sure if his mother truly had colon cancer or precancerous/advanced polyps.  We will request a previous colonoscopy report from [facility] in [place], [place] that was completed in 2015.  He states they recommended a 5-year repeat and he is currently a year overdue.  Based on his recall of recommendations we will proceed with colonoscopy at this time.  Generally asymptomatic from a GI standpoint.  Further recommendations to follow.  Proceed with TCS on propofol/MAC with Dr. Gala Romney in near future: the risks, benefits, and alternatives have been discussed with the patient in detail. The patient states understanding and desires to proceed.  The patient is currently on Lunesta for insomnia.  No other anticoagulants, anxiolytics, chronic pain medications, antidepressants, antidiabetics, or iron supplements.  We will plan for the procedure on propofol/MAC to promote adequate sedation.

## 2019-05-13 ENCOUNTER — Other Ambulatory Visit: Payer: Self-pay

## 2019-05-13 ENCOUNTER — Encounter (HOSPITAL_COMMUNITY): Payer: Self-pay | Admitting: Physical Therapy

## 2019-05-13 ENCOUNTER — Ambulatory Visit (HOSPITAL_COMMUNITY): Admitting: Physical Therapy

## 2019-05-13 DIAGNOSIS — M5416 Radiculopathy, lumbar region: Secondary | ICD-10-CM

## 2019-05-13 DIAGNOSIS — M6281 Muscle weakness (generalized): Secondary | ICD-10-CM

## 2019-05-13 NOTE — Therapy (Signed)
[facility] [address] [place], [place], [address] Phone: [phone]   Fax:  [phone]  Physical Therapy Treatment  Patient Details  Name: [name] [name] MRN: [id number] Date of Birth: 07/20/1965 Referring Provider (PT): Annell Greening    Encounter Date: 05/13/2019  PT End of Session - 05/13/19 0932    Visit Number  2    Number of Visits  15    Date for PT Re-Evaluation  06/22/19    Authorization Type  [employer] 15 limitation    Authorization - Visit Number  2    Authorization - Number of Visits  15    Progress Note Due on Visit  10    PT Start Time  0840    PT Stop Time  0925    PT Time Calculation (min)  45 min    Activity Tolerance  Patient tolerated treatment well    Behavior During Therapy  [facility] for tasks assessed/performed       Past Medical History:  Diagnosis Date  . Gout   . Hypertension   . Medical history non-contributory   . Wears glasses     Past Surgical History:  Procedure Laterality Date  . KNEE ARTHROSCOPY WITH PATELLAR TENDON REPAIR Left 09/03/2012   Procedure: LEFT KNEE ARTHROSCOPY, LOOSE BODY EXCISION, PATELLAR TENDON REPAIR, EXCISION TIBIAL TUBERCLE SPUR, CHONDROPLASTY PATELLA, EXCISION PLICA;  Surgeon: Loreta Ave, MD;  Location: [facility];  Service: Orthopedics;  Laterality: Left;  . WISDOM TOOTH EXTRACTION      There were no vitals filed for this visit.  Subjective Assessment - 05/13/19 0839    Subjective  PT states that he did a little of the exercises    Pertinent History  arthroscopic surgery, HTN, sleep apnea, chronic neck and back pain    Limitations  Sitting;Standing;Walking;Lifting;House hold activities    How long can you sit comfortably?  10-15 minutes    How long can you stand comfortably?  5 minutes n    How long can you walk comfortably?  increased pain after 15 minutes    Patient Stated Goals  less pain    Currently in Pain?  Yes    Pain Score  5     Pain Location   Back    Pain Orientation  Lower    Pain Descriptors / Indicators  Aching    Pain Type  Chronic pain    Pain Onset  More than a month ago    Pain Relieving Factors  not sure    Effect of Pain on Daily Activities  limits                       [facility] Adult PT Treatment/Exercise - 05/13/19 0001      Exercises   Exercises  Lumbar      Lumbar Exercises: Stretches   Active Hamstring Stretch  Right;Left;3 reps;30 seconds    Single Knee to Chest Stretch  Right;Left;3 reps;30 seconds    Lower Trunk Rotation  5 reps    Prone on Elbows Stretch  1 rep;60 seconds    Prone on Elbows Stretch Limitations  deep breaths/exhale relax with therapist assisting thoracic extension      Lumbar Exercises: Standing   Other Standing Lumbar Exercises  hip excursions x 3       Lumbar Exercises: Seated   Other Seated Lumbar Exercises  thoracic excursions x 3       Lumbar Exercises:  Supine   Ab Set  5 reps    Bent Knee Raise  10 reps    Bridge  10 reps      Manual Therapy   Manual Therapy  Joint mobilization;Soft tissue mobilization    Manual therapy comments  done seperate from all other aspects of treatmentt    Joint Mobilization  to improve thoracic mobility     Soft tissue mobilization  to decrease tension .              PT Education - 05/13/19 0932    Education Details  hep    Person(s) Educated  Patient    Methods  Explanation;Demonstration;Verbal cues;Handout    Comprehension  Verbalized understanding;Returned demonstration       PT Short Term Goals - 05/13/19 2694      PT SHORT TERM GOAL #1   Title  Pt to be I in HEP to improve back and hip motion to be able to bend and reach the floor to allow pt to pick items off the ground in his yard.    Time  3    Period  Weeks    Status  On-going    Target Date  06/02/19      PT SHORT TERM GOAL #2   Title  PT to have increased knowledge and be using improved body mechanics for bed mobility and lifting  to have no radiating  sx to demonstrate decreased nerve root irritation.    Time  3    Period  Weeks    Status  On-going        PT Long Term Goals - 05/13/19 8546      PT LONG TERM GOAL #1   Title  Pt to be I in advance HEP to be able to decrease low back pain to no greater than a 2/10 .    Time  6    Period  Weeks    Status  On-going      PT LONG TERM GOAL #2   Title  PT to be able to sit, stand or walk for over 30 mintues at a time to improve his functional ability for yard and housework.    Time  6    Period  Weeks    Status  On-going      PT LONG TERM GOAL #3   Title  PT LE strength to be at least 4+ /5 to increase the ease of standing up from a low lying soft area such as a couch without the use of his UE>    Time  6    Period  Weeks    Status  On-going            Plan - 05/13/19 0935    Clinical Impression Statement  Evaluation and goals reviewed with pt.  [name] states that he attempted to do his exercises. Pt is very stiff in his thoracic area, however, manual is difficult to complete as pt is very ticklish and requests to attempt to improve motion some other way.    Examination-Activity Limitations  Bend;Carry;Squat;Sit;Lift;Stand;Locomotion Level    Examination-Participation Restrictions  Yard Work;Community Activity    Stability/Clinical Decision Making  Stable/Uncomplicated    Rehab Potential  Good    PT Frequency  2x / week    PT Duration  6 weeks    PT Treatment/Interventions  Therapeutic exercise;Therapeutic activities;Balance training;Functional mobility training;Patient/family education;Joint Manipulations;Dry needling;Manual techniques    PT Next Visit Plan  Begin chest stretch, wall reach Y lift off    PT Home Exercise Plan  eval: standing extension, knee to chest, active hamstring stretch and POE; 3/4/: hip and thoracic excursions.       Patient will benefit from skilled therapeutic intervention in order to improve the following deficits and impairments:  Decreased  mobility, Decreased range of motion, Decreased strength, Impaired flexibility, Pain, Improper body mechanics  Visit Diagnosis: Radiculopathy, lumbar region  Muscle weakness (generalized)     Problem List Patient Active Problem List   Diagnosis Date Noted  . Family history of colon cancer 05/12/2019  . Other intervertebral disc degeneration, lumbar region 05/06/2019  . Essential hypertension 09/01/2018  . Insomnia 10/23/2015  . Obstructive sleep apnea 01/16/2015  . Gout 01/05/2015  . Elevated blood pressure 01/05/2015   Rayetta Humphrey, PT CLT [phone]  05/13/2019, 9:39 AM  [facility] [address] [place], [place], [address] Phone: [phone]   Fax:  [phone]  Name: [name] [name] MRN: [id number] Date of Birth: [birth year]

## 2019-05-17 ENCOUNTER — Other Ambulatory Visit: Payer: Self-pay

## 2019-05-17 ENCOUNTER — Ambulatory Visit (HOSPITAL_COMMUNITY): Admitting: Physical Therapy

## 2019-05-17 DIAGNOSIS — M5416 Radiculopathy, lumbar region: Secondary | ICD-10-CM

## 2019-05-17 DIAGNOSIS — M6281 Muscle weakness (generalized): Secondary | ICD-10-CM

## 2019-05-17 NOTE — Therapy (Signed)
[facility] [address] [place], [place], [address] Phone: [phone]   Fax:  [phone]  Physical Therapy Treatment  Patient Details  Name: [name] [name] Guinea-Bissau MRN: [id number] Date of Birth: [birth year] Referring Provider (PT): Annell Greening    Encounter Date: 05/17/2019  PT End of Session - 05/17/19 1321    Visit Number  3    Number of Visits  15    Date for PT Re-Evaluation  06/22/19    Authorization Type  [employer] 15 limitation    Authorization - Visit Number  3    Authorization - Number of Visits  15    Progress Note Due on Visit  10    PT Start Time  [phone]    PT Stop Time  0930    PT Time Calculation (min)  52 min    Activity Tolerance  Patient tolerated treatment well    Behavior During Therapy  [facility] for tasks assessed/performed       Past Medical History:  Diagnosis Date  . Gout   . Hypertension   . Medical history non-contributory   . Wears glasses     Past Surgical History:  Procedure Laterality Date  . KNEE ARTHROSCOPY WITH PATELLAR TENDON REPAIR Left 09/03/2012   Procedure: LEFT KNEE ARTHROSCOPY, LOOSE BODY EXCISION, PATELLAR TENDON REPAIR, EXCISION TIBIAL TUBERCLE SPUR, CHONDROPLASTY PATELLA, EXCISION PLICA;  Surgeon: Loreta Ave, MD;  Location: [facility];  Service: Orthopedics;  Laterality: Left;  . WISDOM TOOTH EXTRACTION      There were no vitals filed for this visit.  Subjective Assessment - 05/17/19 0901    Subjective  Pt states he did not have the chance to stretch over the weekend.  STates he had a sinus HA all weekend due to weather change.  Came into therapy with 5/10 pain today in central [place] with little to no radiculopathy.    Currently in Pain?  Yes    Pain Score  5     Pain Location  Back    Pain Orientation  Mid;Lower    Pain Descriptors / Indicators  Aching                       [facility] Adult PT Treatment/Exercise - 05/17/19 0001      Lumbar Exercises: Stretches   Active Hamstring Stretch  Right;Left;3 reps;30 seconds    Single Knee to Chest Stretch  Right;Left;3 reps;30 seconds    Lower Trunk Rotation  5 reps    Other Lumbar Stretch Exercise  corner stretch 3X30"      Lumbar Exercises: Standing   Other Standing Lumbar Exercises  hip excursions x 5     Other Standing Lumbar Exercises  UE arches against wall 10 reps      Lumbar Exercises: Seated   Other Seated Lumbar Exercises  thoracic excursions x 5 with UE movements       Lumbar Exercises: Supine   Bridge  10 reps      Manual Therapy   Manual Therapy  Soft tissue mobilization    Manual therapy comments  done seperate from all other aspects of treatmentt    Soft tissue mobilization  prone lumbar               PT Short Term Goals - 05/13/19 9628      PT SHORT TERM GOAL #1   Title  Pt to be I in HEP to improve back and hip  motion to be able to bend and reach the floor to allow pt to pick items off the ground in his yard.    Time  3    Period  Weeks    Status  On-going    Target Date  06/02/19      PT SHORT TERM GOAL #2   Title  PT to have increased knowledge and be using improved body mechanics for bed mobility and lifting  to have no radiating sx to demonstrate decreased nerve root irritation.    Time  3    Period  Weeks    Status  On-going        PT Long Term Goals - 05/13/19 9604      PT LONG TERM GOAL #1   Title  Pt to be I in advance HEP to be able to decrease low back pain to no greater than a 2/10 .    Time  6    Period  Weeks    Status  On-going      PT LONG TERM GOAL #2   Title  PT to be able to sit, stand or walk for over 30 mintues at a time to improve his functional ability for yard and housework.    Time  6    Period  Weeks    Status  On-going      PT LONG TERM GOAL #3   Title  PT LE strength to be at least 4+ /5 to increase the ease of standing up from a low lying soft area such as a couch without the use of his UE>    Time  6    Period  Weeks     Status  On-going            Plan - 05/17/19 1326    Clinical Impression Statement  Continued with established therex with addition of chest stretch and UE arch exercises to help strengthen postural mm.  Pt able to complete with minimal cues needed.  Pt completes supine exercises and stretches slow and controlled and in good form other than cues with thoracic excursion.  pt tends to move body instead of isolating.  Able to complete manual today to lower lumbar region with sensitivity (ticklish) into thoracic region.  Pt with more tightness in Rt lower lumbar as compared to Lt today.    Examination-Activity Limitations  Bend;Carry;Squat;Sit;Lift;Stand;Locomotion Level    Examination-Participation Restrictions  Yard Work;Community Activity    Stability/Clinical Decision Making  Stable/Uncomplicated    Rehab Potential  Good    PT Frequency  2x / week    PT Duration  6 weeks    PT Treatment/Interventions  Therapeutic exercise;Therapeutic activities;Balance training;Functional mobility training;Patient/family education;Joint Manipulations;Dry needling;Manual techniques    PT Next Visit Plan  continue with lumbar stabiliy and postural strengthening.  Tends to have less pain with flexion vs extension.    PT Home Exercise Plan  eval: standing extension, knee to chest, active hamstring stretch and POE; 3/4/: hip and thoracic excursions.       Patient will benefit from skilled therapeutic intervention in order to improve the following deficits and impairments:  Decreased mobility, Decreased range of motion, Decreased strength, Impaired flexibility, Pain, Improper body mechanics  Visit Diagnosis: Radiculopathy, lumbar region  Muscle weakness (generalized)     Problem List Patient Active Problem List   Diagnosis Date Noted  . Family history of colon cancer 05/12/2019  . Other intervertebral disc degeneration, lumbar region 05/06/2019  .  Essential hypertension 09/01/2018  . Insomnia  10/23/2015  . Obstructive sleep apnea 01/16/2015  . Gout 01/05/2015  . Elevated blood pressure 01/05/2015   Lurena Nida, PTA/CLT [phone]  Lurena Nida 05/17/2019, 1:33 PM  [place] [facility] [address] [place], [place], [address] Phone: [phone]   Fax:  [phone]  Name: Ahmod [name] Guinea-Bissau MRN: [id number] Date of Birth: Jul 02, 1965

## 2019-05-19 ENCOUNTER — Other Ambulatory Visit: Payer: Self-pay

## 2019-05-19 ENCOUNTER — Ambulatory Visit (HOSPITAL_COMMUNITY): Admitting: Physical Therapy

## 2019-05-19 ENCOUNTER — Encounter (HOSPITAL_COMMUNITY): Payer: Self-pay | Admitting: Physical Therapy

## 2019-05-19 DIAGNOSIS — M6281 Muscle weakness (generalized): Secondary | ICD-10-CM

## 2019-05-19 DIAGNOSIS — M5416 Radiculopathy, lumbar region: Secondary | ICD-10-CM

## 2019-05-19 NOTE — Therapy (Signed)
[facility] [address] [place], [place], [address] Phone: [phone]   Fax:  [phone]  Physical Therapy Treatment  Patient Details  Name: [name] [name] MRN: [id number] Date of Birth: 19-Apr-1965 Referring Provider (PT): Annell Greening    Encounter Date: 05/19/2019  PT End of Session - 05/19/19 0931    Visit Number  4    Number of Visits  15    Date for PT Re-Evaluation  06/22/19    Authorization Type  [employer] 15 limitation    Authorization - Visit Number  4    Authorization - Number of Visits  15    Progress Note Due on Visit  10    PT Start Time  0845    PT Stop Time  0930    PT Time Calculation (min)  45 min    Activity Tolerance  Patient tolerated treatment well    Behavior During Therapy  [facility] for tasks assessed/performed       Past Medical History:  Diagnosis Date  . Gout   . Hypertension   . Medical history non-contributory   . Wears glasses     Past Surgical History:  Procedure Laterality Date  . KNEE ARTHROSCOPY WITH PATELLAR TENDON REPAIR Left 09/03/2012   Procedure: LEFT KNEE ARTHROSCOPY, LOOSE BODY EXCISION, PATELLAR TENDON REPAIR, EXCISION TIBIAL TUBERCLE SPUR, CHONDROPLASTY PATELLA, EXCISION PLICA;  Surgeon: Loreta Ave, MD;  [facility];  Service: Orthopedics;  Laterality: Left;  . WISDOM TOOTH EXTRACTION      There were no vitals filed for this visit.  Subjective Assessment - 05/19/19 0845    Subjective  Pt states that he is stiff more than anything.    Pertinent History  arthroscopic surgery, HTN, sleep apnea, chronic neck and back pain    Limitations  Sitting;Standing;Walking;Lifting;House hold activities    How long can you sit comfortably?  10-15 minutes    How long can you stand comfortably?  5 minutes n    How long can you walk comfortably?  increased pain after 15 minutes    Patient Stated Goals  less pain    Pain Onset  More than a month ago                        [facility] Adult PT Treatment/Exercise - 05/19/19 0001      Exercises   Exercises  Lumbar      Lumbar Exercises: Stretches   Active Hamstring Stretch  Right;Left;3 reps;30 seconds    Single Knee to Chest Stretch  Right;Left;3 reps;30 seconds    Prone on Elbows Stretch  1 rep;60 seconds    Prone on Elbows Stretch Limitations  deep breaths/exhale relax with therapist assisting thoracic extension    Other Lumbar Stretch Exercise  corner stretch 3X30"      Lumbar Exercises: Standing   Scapular Retraction  Both;10 reps;Theraband    Theraband Level (Scapular Retraction)  Level 3 (Green)    Row  Both;10 reps;Theraband    Theraband Level (Row)  Level 3 (Green)    Shoulder Extension  10 reps;Theraband    Theraband Level (Shoulder Extension)  Level 3 (Green)    Other Standing Lumbar Exercises  Palloff x 10; hip excursions     Other Standing Lumbar Exercises  UE arches against wall 10 reps      Lumbar Exercises: Seated   Other Seated Lumbar Exercises  thoracic excursions x 5 with UE movements  Lumbar Exercises: Supine   Dead Bug  10 reps      Lumbar Exercises: Prone   Other Prone Lumbar Exercises  rows/shld extension x 10       Manual Therapy   Manual Therapy  Soft tissue mobilization    Manual therapy comments  done seperate from all other aspects of treatmentt    Joint Mobilization  to improve thoracic mobility     Soft tissue mobilization  prone lumbar               PT Short Term Goals - 05/13/19 2751      PT SHORT TERM GOAL #1   Title  Pt to be I in HEP to improve back and hip motion to be able to bend and reach the floor to allow pt to pick items off the ground in his yard.    Time  3    Period  Weeks    Status  On-going    Target Date  06/02/19      PT SHORT TERM GOAL #2   Title  PT to have increased knowledge and be using improved body mechanics for bed mobility and lifting  to have no radiating sx to demonstrate decreased  nerve root irritation.    Time  3    Period  Weeks    Status  On-going        PT Long Term Goals - 05/13/19 7001      PT LONG TERM GOAL #1   Title  Pt to be I in advance HEP to be able to decrease low back pain to no greater than a 2/10 .    Time  6    Period  Weeks    Status  On-going      PT LONG TERM GOAL #2   Title  PT to be able to sit, stand or walk for over 30 mintues at a time to improve his functional ability for yard and housework.    Time  6    Period  Weeks    Status  On-going      PT LONG TERM GOAL #3   Title  PT LE strength to be at least 4+ /5 to increase the ease of standing up from a low lying soft area such as a couch without the use of his UE>    Time  6    Period  Weeks    Status  On-going            Plan - 05/19/19 7494    Clinical Impression Statement  Pt with no pain today, noted improvement in thoracic and lumbar motion.  Advanced to  standing and prone exercises with verbal cuing needed for proper technique.    Examination-Activity Limitations  Bend;Carry;Squat;Sit;Lift;Stand;Locomotion Level    Examination-Participation Restrictions  Yard Work;Community Activity    Stability/Clinical Decision Making  Stable/Uncomplicated    Rehab Potential  Good    PT Frequency  2x / week    PT Duration  6 weeks    PT Treatment/Interventions  Therapeutic exercise;Therapeutic activities;Balance training;Functional mobility training;Patient/family education;Joint Manipulations;Dry needling;Manual techniques    PT Next Visit Plan  Complete foto, begin prone opposite arm/leg    PT Home Exercise Plan  eval: standing extension, knee to chest, active hamstring stretch and POE; 3/4/: hip and thoracic excursions.       Patient will benefit from skilled therapeutic intervention in order to improve the following deficits and impairments:  Decreased mobility,  Decreased range of motion, Decreased strength, Impaired flexibility, Pain, Improper body mechanics  Visit  Diagnosis: Muscle weakness (generalized)  Radiculopathy, lumbar region     Problem List Patient Active Problem List   Diagnosis Date Noted  . Family history of colon cancer 05/12/2019  . Other intervertebral disc degeneration, lumbar region 05/06/2019  . Essential hypertension 09/01/2018  . Insomnia 10/23/2015  . Obstructive sleep apnea 01/16/2015  . Gout 01/05/2015  . Elevated blood pressure 01/05/2015    Rayetta Humphrey, PT CLT [phone] 05/19/2019, 9:41 AM  [place] [address] [place], [place], [address] Phone: [phone]   Fax:  [phone]  Name: [name] [name] MRN: [id number] Date of Birth: 1965/05/06

## 2019-05-24 ENCOUNTER — Ambulatory Visit (HOSPITAL_COMMUNITY): Admitting: Physical Therapy

## 2019-05-24 ENCOUNTER — Other Ambulatory Visit: Payer: Self-pay

## 2019-05-24 DIAGNOSIS — M6281 Muscle weakness (generalized): Secondary | ICD-10-CM

## 2019-05-24 DIAGNOSIS — M5416 Radiculopathy, lumbar region: Secondary | ICD-10-CM

## 2019-05-24 NOTE — Therapy (Signed)
[place], [place], [address] Phone: [phone]   Fax:  [phone]  Physical Therapy Treatment  Patient Details  Name: [name] [name] MRN: [id number] Date of Birth: 07-Feb-1966 Referring Provider (PT): Rodell Perna    Encounter Date: 05/24/2019  PT End of Session - 05/24/19 0921    Visit Number  5    Number of Visits  15    Date for PT Re-Evaluation  06/22/19    Authorization Type  [employer] 15 limitation    Authorization - Visit Number  5    Authorization - Number of Visits  15    Progress Note Due on Visit  10    PT Start Time  0835    PT Stop Time  0918    PT Time Calculation (min)  43 min    Activity Tolerance  Patient tolerated treatment well    Behavior During Therapy  [facility] for tasks assessed/performed       Past Medical History:  Diagnosis Date  . Gout   . Hypertension   . Medical history non-contributory   . Wears glasses     Past Surgical History:  Procedure Laterality Date  . KNEE ARTHROSCOPY WITH PATELLAR TENDON REPAIR Left 09/03/2012   Procedure: LEFT KNEE ARTHROSCOPY, LOOSE BODY EXCISION, PATELLAR TENDON REPAIR, EXCISION TIBIAL TUBERCLE SPUR, CHONDROPLASTY PATELLA, EXCISION PLICA;  Surgeon: Ninetta Lights, MD;  Location: [facility];  Service: Orthopedics;  Laterality: Left;  . WISDOM TOOTH EXTRACTION      There were no vitals filed for this visit.  Subjective Assessment - 05/24/19 0851    Subjective  pt states he can tell he is improving. Reports some stiffness/soreness.  Pain at 4/10 today.  Reports he did yardwork all weekend.    Currently in Pain?  Yes    Pain Score  4     Pain Location  Back    Pain Orientation  Mid;Lower    Pain Descriptors / Indicators  Sore    Pain Type  Chronic pain         [facility] PT Assessment - 05/24/19 0001      Assessment   Medical Diagnosis  LT lumbar radiculopathy       Observation/Other Assessments   Focus on Therapeutic Outcomes  (FOTO)   52 was 55; 46% affected                    [facility] Adult PT Treatment/Exercise - 05/24/19 0001      Lumbar Exercises: Stretches   Active Hamstring Stretch  Right;Left;3 reps;30 seconds    Active Hamstring Stretch Limitations  standing today with 12" box    Other Lumbar Stretch Exercise  corner stretch 3X30"      Lumbar Exercises: Standing   Scapular Retraction  Both;10 reps;Theraband    Theraband Level (Scapular Retraction)  Level 3 (Green)    Row  Both;10 reps;Theraband    Theraband Level (Row)  Level 3 (Green)    Shoulder Extension  10 reps;Theraband    Theraband Level (Shoulder Extension)  Level 3 (Green)    Other Standing Lumbar Exercises  Palloff x 10 GTB; hip excursions     Other Standing Lumbar Exercises  UE arches against wall 10 reps      Lumbar Exercises: Prone   Opposite Arm/Leg Raise  Right arm/Left leg;Left arm/Right leg;5 reps      Manual Therapy   Manual Therapy  Soft tissue mobilization  Manual therapy comments  done seperate from all other aspects of treatmentt    Joint Mobilization  to improve thoracic mobility     Soft tissue mobilization  prone lumbar Rt>Lt               PT Short Term Goals - 05/13/19 7673      PT SHORT TERM GOAL #1   Title  Pt to be I in HEP to improve back and hip motion to be able to bend and reach the floor to allow pt to pick items off the ground in his yard.    Time  3    Period  Weeks    Status  On-going    Target Date  06/02/19      PT SHORT TERM GOAL #2   Title  PT to have increased knowledge and be using improved body mechanics for bed mobility and lifting  to have no radiating sx to demonstrate decreased nerve root irritation.    Time  3    Period  Weeks    Status  On-going        PT Long Term Goals - 05/13/19 4193      PT LONG TERM GOAL #1   Title  Pt to be I in advance HEP to be able to decrease low back pain to no greater than a 2/10 .    Time  6    Period  Weeks    Status  On-going       PT LONG TERM GOAL #2   Title  PT to be able to sit, stand or walk for over 30 mintues at a time to improve his functional ability for yard and housework.    Time  6    Period  Weeks    Status  On-going      PT LONG TERM GOAL #3   Title  PT LE strength to be at least 4+ /5 to increase the ease of standing up from a low lying soft area such as a couch without the use of his UE>    Time  6    Period  Weeks    Status  On-going            Plan - 05/24/19 1102    Clinical Impression Statement  continued with established therex with verbal cues needed for general form.  Pt with difficulty completing squats in correct form with tendency to shift weight forward.  Educated with lifting techniques and golf lift as states his Lt knee sometimes bothers him with squats due to "bone on bone".  FOTO completed with no significant change noted at visit #5.  Began prone opp UE/LE with cues for core stab.  Soft tissue completed in prone at EOS mostly on Rt side.  No significant tightness or spasms palpated.    Examination-Activity Limitations  Bend;Carry;Squat;Sit;Lift;Stand;Locomotion Level    Examination-Participation Restrictions  Yard Work;Community Activity    Stability/Clinical Decision Making  Stable/Uncomplicated    Rehab Potential  Good    PT Frequency  2x / week    PT Duration  6 weeks    PT Treatment/Interventions  Therapeutic exercise;Therapeutic activities;Balance training;Functional mobility training;Patient/family education;Joint Manipulations;Dry needling;Manual techniques    PT Next Visit Plan  Continue to progress core stab and strength.    PT Home Exercise Plan  eval: standing extension, knee to chest, active hamstring stretch and POE; 3/4/: hip and thoracic excursions.       Patient will benefit  from skilled therapeutic intervention in order to improve the following deficits and impairments:  Decreased mobility, Decreased range of motion, Decreased strength, Impaired  flexibility, Pain, Improper body mechanics  Visit Diagnosis: Radiculopathy, lumbar region  Muscle weakness (generalized)     Problem List Patient Active Problem List   Diagnosis Date Noted  . Family history of colon cancer 05/12/2019  . Other intervertebral disc degeneration, lumbar region 05/06/2019  . Essential hypertension 09/01/2018  . Insomnia 10/23/2015  . Obstructive sleep apnea 01/16/2015  . Gout 01/05/2015  . Elevated blood pressure 01/05/2015   Lurena Nida, PTA/CLT [phone]  Lurena Nida 05/24/2019, 11:10 AM  [place] [address] [place], [place], [address] Phone: [phone]   Fax:  [phone]  Name: [name] [name] MRN: [id number] Date of Birth: 09/24/1965

## 2019-05-26 ENCOUNTER — Ambulatory Visit (HOSPITAL_COMMUNITY): Admitting: Physical Therapy

## 2019-05-26 ENCOUNTER — Other Ambulatory Visit: Payer: Self-pay

## 2019-05-26 ENCOUNTER — Encounter (HOSPITAL_COMMUNITY): Payer: Self-pay | Admitting: Physical Therapy

## 2019-05-26 DIAGNOSIS — M5416 Radiculopathy, lumbar region: Secondary | ICD-10-CM | POA: Diagnosis not present

## 2019-05-26 DIAGNOSIS — M6281 Muscle weakness (generalized): Secondary | ICD-10-CM

## 2019-05-26 NOTE — Therapy (Signed)
[facility] [address] [place], [place], [address] Phone: [phone]   Fax:  [phone]  Physical Therapy Treatment  Patient Details  Name: [name] [name] MRN: [id number] Date of Birth: [birth year] Referring Provider (PT): Annell Greening    Encounter Date: 05/26/2019  PT End of Session - 05/26/19 0904    Visit Number  6    Number of Visits  15    Date for PT Re-Evaluation  06/22/19    Authorization Type  [employer] limitation    Authorization - Visit Number  6    Authorization - Number of Visits  15    Progress Note Due on Visit  10    PT Start Time  0832    PT Stop Time  0910    PT Time Calculation (min)  38 min    Activity Tolerance  Patient tolerated treatment well    Behavior During Therapy  [facility] for tasks assessed/performed       Past Medical History:  Diagnosis Date  . Gout   . Hypertension   . Medical history non-contributory   . Wears glasses     Past Surgical History:  Procedure Laterality Date  . KNEE ARTHROSCOPY WITH PATELLAR TENDON REPAIR Left 09/03/2012   Procedure: LEFT KNEE ARTHROSCOPY, LOOSE BODY EXCISION, PATELLAR TENDON REPAIR, EXCISION TIBIAL TUBERCLE SPUR, CHONDROPLASTY PATELLA, EXCISION PLICA;  Surgeon: Loreta Ave, MD;  Location: [facility];  Service: Orthopedics;  Laterality: Left;  . WISDOM TOOTH EXTRACTION      There were no vitals filed for this visit.  Subjective Assessment - 05/26/19 0833    Subjective  PT states he is doing well has not done a lot today yet so his pain is low.    Pertinent History  arthroscopic surgery, HTN, sleep apnea, chronic neck and back pain    Limitations  Sitting;Standing;Walking;Lifting;House hold activities    How long can you sit comfortably?  10-15 minutes    How long can you stand comfortably?  5 minutes n    How long can you walk comfortably?  increased pain after 15 minutes    Patient Stated Goals  less pain    Currently in Pain?  Yes    Pain  Score  1     Pain Location  Back    Pain Orientation  Lower    Pain Descriptors / Indicators  Aching    Pain Type  Chronic pain    Pain Onset  More than a month ago    Aggravating Factors   sitting    Pain Relieving Factors  not sure    Effect of Pain on Daily Activities  limits                       [facility] Adult PT Treatment/Exercise - 05/26/19 0001      Exercises   Exercises  Lumbar      Lumbar Exercises: Stretches   Active Hamstring Stretch  Right;Left;3 reps;30 seconds    Single Knee to Chest Stretch  Right;Left;3 reps;30 seconds    Prone on Elbows Stretch  1 rep;60 seconds    Other Lumbar Stretch Exercise  3 D hip excursion    Other Lumbar Stretch Exercise  wall arch x 10. Y lift off x5      Lumbar Exercises: Standing   Other Standing Lumbar Exercises  Palloff x 10 GTB; hip excursions       Lumbar Exercises:  Supine   Dead Bug  10 reps    Bridge with March  10 reps      Lumbar Exercises: Prone   Opposite Arm/Leg Raise  Right arm/Left leg;Left arm/Right leg;5 reps    Other Prone Lumbar Exercises  LT ham curl with 4#x 10       Manual Therapy   Manual Therapy  Soft tissue mobilization    Manual therapy comments  done seperate from all other aspects of treatmentt    Joint Mobilization  to improve thoracic mobility     Soft tissue mobilization  prone lumbar Rt>Lt               PT Short Term Goals - 05/13/19 2297      PT SHORT TERM GOAL #1   Title  Pt to be I in HEP to improve back and hip motion to be able to bend and reach the floor to allow pt to pick items off the ground in his yard.    Time  3    Period  Weeks    Status  On-going    Target Date  06/02/19      PT SHORT TERM GOAL #2   Title  PT to have increased knowledge and be using improved body mechanics for bed mobility and lifting  to have no radiating sx to demonstrate decreased nerve root irritation.    Time  3    Period  Weeks    Status  On-going        PT Long Term Goals -  05/13/19 9892      PT LONG TERM GOAL #1   Title  Pt to be I in advance HEP to be able to decrease low back pain to no greater than a 2/10 .    Time  6    Period  Weeks    Status  On-going      PT LONG TERM GOAL #2   Title  PT to be able to sit, stand or walk for over 30 mintues at a time to improve his functional ability for yard and housework.    Time  6    Period  Weeks    Status  On-going      PT LONG TERM GOAL #3   Title  PT LE strength to be at least 4+ /5 to increase the ease of standing up from a low lying soft area such as a couch without the use of his UE>    Time  6    Period  Weeks    Status  On-going            Plan - 05/26/19 0905    Clinical Impression Statement  Added higher level difficulty with stabilization, ham curls for weakned hamstring on Lt LE and increased difficulyt of bridge by marching.  Pt reviewed better body mechanics when stooping over.    Examination-Activity Limitations  Bend;Carry;Squat;Sit;Lift;Stand;Locomotion Level    Examination-Participation Restrictions  Yard Work;Community Activity    Stability/Clinical Decision Making  Stable/Uncomplicated    Rehab Potential  Good    PT Frequency  2x / week    PT Duration  6 weeks    PT Treatment/Interventions  Therapeutic exercise;Therapeutic activities;Balance training;Functional mobility training;Patient/family education;Joint Manipulations;Dry needling;Manual techniques    PT Next Visit Plan  Continue to progress core stab and strength.    PT Home Exercise Plan  eval: standing extension, knee to chest, active hamstring stretch and POE; 3/4/: hip and  thoracic excursions.3/16:  marching bridge and dead bug.       Patient will benefit from skilled therapeutic intervention in order to improve the following deficits and impairments:  Decreased mobility, Decreased range of motion, Decreased strength, Impaired flexibility, Pain, Improper body mechanics  Visit Diagnosis: Radiculopathy, lumbar  region  Muscle weakness (generalized)     Problem List Patient Active Problem List   Diagnosis Date Noted  . Family history of colon cancer 05/12/2019  . Other intervertebral disc degeneration, lumbar region 05/06/2019  . Essential hypertension 09/01/2018  . Insomnia 10/23/2015  . Obstructive sleep apnea 01/16/2015  . Gout 01/05/2015  . Elevated blood pressure 01/05/2015    Rayetta Humphrey, PT CLT [phone] 05/26/2019, 9:23 AM  [facility] [address] [place], [place], [address] Phone: [phone]   Fax:  [phone]  Name: [name] [name] MRN: [id number] Date of Birth: [birth year]

## 2019-05-29 ENCOUNTER — Ambulatory Visit: Attending: Internal Medicine

## 2019-05-29 DIAGNOSIS — Z23 Encounter for immunization: Secondary | ICD-10-CM

## 2019-05-29 NOTE — Progress Notes (Signed)
   Covid-19 Vaccination Clinic  Name:  [name] [name]    MRN: [id number] DOB: 18-Jun-1965  05/29/2019  [name] [name] was observed post Covid-19 immunization for 15 minutes without incident. He was provided with Vaccine Information Sheet and instruction to access the V-Safe system.   [name] [name] was instructed to call 911 with any severe reactions post vaccine: Marland Kitchen Difficulty breathing  . Swelling of face and throat  . A fast heartbeat  . A bad rash all over body  . Dizziness and weakness   Immunizations Administered    Name Date Dose VIS Date Route   Moderna COVID-19 Vaccine 05/29/2019 11:36 AM 0.5 mL 02/09/2019 Intramuscular   Manufacturer: Moderna   Lot: [id number]   NDC: 31517-616-07

## 2019-05-31 ENCOUNTER — Other Ambulatory Visit: Payer: Self-pay

## 2019-05-31 ENCOUNTER — Encounter (HOSPITAL_COMMUNITY): Payer: Self-pay

## 2019-05-31 ENCOUNTER — Ambulatory Visit (HOSPITAL_COMMUNITY)

## 2019-05-31 DIAGNOSIS — M5416 Radiculopathy, lumbar region: Secondary | ICD-10-CM | POA: Diagnosis not present

## 2019-05-31 DIAGNOSIS — M6281 Muscle weakness (generalized): Secondary | ICD-10-CM

## 2019-05-31 NOTE — Therapy (Signed)
[facility] [address] [place], [place], [address] Phone: [phone]   Fax:  [phone]  Physical Therapy Treatment  Patient Details  Name: [name] [name] MRN: [id number] Date of Birth: 1965-07-31 Referring Provider (PT): Annell Greening    Encounter Date: 05/31/2019  PT End of Session - 05/31/19 0908    Visit Number  7    Number of Visits  15    Date for PT Re-Evaluation  06/22/19    Authorization Type  [employer] 15 limitation    Authorization - Visit Number  7    Authorization - Number of Visits  15    Progress Note Due on Visit  10    PT Start Time  0902    PT Stop Time  0946    PT Time Calculation (min)  44 min    Activity Tolerance  Patient tolerated treatment well;No increased pain    Behavior During Therapy  WFL for tasks assessed/performed       Past Medical History:  Diagnosis Date  . Gout   . Hypertension   . Medical history non-contributory   . Wears glasses     Past Surgical History:  Procedure Laterality Date  . KNEE ARTHROSCOPY WITH PATELLAR TENDON REPAIR Left 09/03/2012   Procedure: LEFT KNEE ARTHROSCOPY, LOOSE BODY EXCISION, PATELLAR TENDON REPAIR, EXCISION TIBIAL TUBERCLE SPUR, CHONDROPLASTY PATELLA, EXCISION PLICA;  Surgeon: Loreta Ave, MD;  Location: [facility];  Service: Orthopedics;  Laterality: Left;  . WISDOM TOOTH EXTRACTION      There were no vitals filed for this visit.  Subjective Assessment - 05/31/19 0903    Subjective  Pt reports pain isn't that bad unless he starts doing activity.    Pertinent History  arthroscopic surgery, HTN, sleep apnea, chronic neck and back pain    Limitations  Sitting;Standing;Walking;Lifting;House hold activities    How long can you sit comfortably?  10-15 minutes    How long can you stand comfortably?  5 minutes n    How long can you walk comfortably?  increased pain after 15 minutes    Patient Stated Goals  less pain    Currently in Pain?  Yes    Pain Score  1     Pain Location  Back    Pain Orientation  Lower    Pain Descriptors / Indicators  Tightness    Pain Type  Chronic pain    Pain Onset  More than a month ago    Pain Frequency  Constant    Aggravating Factors   sitting    Pain Relieving Factors  not sure    Effect of Pain on Daily Activities  limits              [facility] Adult PT Treatment/Exercise - 05/31/19 0001      Lumbar Exercises: Stretches   Single Knee to Chest Stretch  Right;Left;3 reps;30 seconds    Lower Trunk Rotation  5 reps;10 seconds    Prone on Elbows Stretch  1 rep;60 seconds    Other Lumbar Stretch Exercise  QL door way stretch, x30 sec each direction      Lumbar Exercises: Supine   Dead Bug  10 reps    Dead Bug Limitations  5 reps with 10sec isometric hold; 10 reps alternating LE/UE extension    Bridge  15 reps;5 seconds      Lumbar Exercises: Prone   Opposite Arm/Leg Raise  Right arm/Left leg;Left arm/Right leg;5  reps      Manual Therapy   Manual Therapy  Soft tissue mobilization    Manual therapy comments  done seperate from all other aspects of treatmentt    Soft tissue mobilization  prone lumbar R lumbar paraspinals into glutes at iliac crest to reduce pain and palpable restriction             PT Education - 05/31/19 0908    Education Details  Continue HEP, exercise technique    Person(s) Educated  Patient    Methods  Explanation    Comprehension  Verbalized understanding       PT Short Term Goals - 05/13/19 0922      PT SHORT TERM GOAL #1   Title  Pt to be I in HEP to improve back and hip motion to be able to bend and reach the floor to allow pt to pick items off the ground in his yard.    Time  3    Period  Weeks    Status  On-going    Target Date  06/02/19      PT SHORT TERM GOAL #2   Title  PT to have increased knowledge and be using improved body mechanics for bed mobility and lifting  to have no radiating sx to demonstrate decreased nerve root irritation.     Time  3    Period  Weeks    Status  On-going        PT Long Term Goals - 05/13/19 1884      PT LONG TERM GOAL #1   Title  Pt to be I in advance HEP to be able to decrease low back pain to no greater than a 2/10 .    Time  6    Period  Weeks    Status  On-going      PT LONG TERM GOAL #2   Title  PT to be able to sit, stand or walk for over 30 mintues at a time to improve his functional ability for yard and housework.    Time  6    Period  Weeks    Status  On-going      PT LONG TERM GOAL #3   Title  PT LE strength to be at least 4+ /5 to increase the ease of standing up from a low lying soft area such as a couch without the use of his UE>    Time  6    Period  Weeks    Status  On-going            Plan - 05/31/19 0909    Clinical Impression Statement  Began treatment with stretching to reduce stiffness and warm up for strengthening. Pt tolerates dead bug progression this session with isometric hold and eccentric core strengthening with opposite arm and leg extending in position. Continued with prone activities with cues for core activation to minimize low back discomfort. Ended with STM to R paraspinals into iliac crest to reduce pain and palpable restrictions. Trialed QL doorway stretch but increased pain so discontinued. Continue to progress as able.    Examination-Activity Limitations  Bend;Carry;Squat;Sit;Lift;Stand;Locomotion Level    Examination-Participation Restrictions  Yard Work;Community Activity    Stability/Clinical Decision Making  Stable/Uncomplicated    Rehab Potential  Good    PT Frequency  2x / week    PT Duration  6 weeks    PT Treatment/Interventions  Therapeutic exercise;Therapeutic activities;Balance training;Functional mobility training;Patient/family education;Joint Manipulations;Dry needling;Manual techniques  PT Next Visit Plan  Progress core strengthening and stability. Add thoracic mobility. FOTO visit #10    PT Home Exercise Plan  eval: standing  extension, knee to chest, active hamstring stretch and POE; 3/4/: hip and thoracic excursions.Jun 18, 2022:  marching bridge and dead bug.    Consulted and Agree with Plan of Care  Patient       Patient will benefit from skilled therapeutic intervention in order to improve the following deficits and impairments:  Decreased mobility, Decreased range of motion, Decreased strength, Impaired flexibility, Pain, Improper body mechanics  Visit Diagnosis: Radiculopathy, lumbar region  Muscle weakness (generalized)     Problem List Patient Active Problem List   Diagnosis Date Noted  . Family history of colon cancer 05/12/2019  . Other intervertebral disc degeneration, lumbar region 05/06/2019  . Essential hypertension 09/01/2018  . Insomnia 10/23/2015  . Obstructive sleep apnea 01/16/2015  . Gout 01/05/2015  . Elevated blood pressure 01/05/2015    Domenick Bookbinder PT, DPT 05/31/19, 10:41 AM [phone]  [facility] [address] [place], [place], [address] Phone: [phone]   Fax:  [phone]  Name: Corbyn J [name] MRN: [id number] Date of Birth: [birth year]

## 2019-06-02 ENCOUNTER — Ambulatory Visit (HOSPITAL_COMMUNITY): Admitting: Physical Therapy

## 2019-06-02 ENCOUNTER — Encounter (HOSPITAL_COMMUNITY): Payer: Self-pay | Admitting: Physical Therapy

## 2019-06-02 ENCOUNTER — Other Ambulatory Visit: Payer: Self-pay

## 2019-06-02 DIAGNOSIS — M6281 Muscle weakness (generalized): Secondary | ICD-10-CM

## 2019-06-02 DIAGNOSIS — M5416 Radiculopathy, lumbar region: Secondary | ICD-10-CM

## 2019-06-02 NOTE — Therapy (Signed)
[facility] [address] [place], [place], [address] Phone: [phone]   Fax:  [phone]  Physical Therapy Treatment  Patient Details  Name: [name] [name] MRN: [id number] Date of Birth: 08-30-1965 Referring Provider (PT): Annell Greening    Encounter Date: 06/02/2019  PT End of Session - 06/02/19 0855    Visit Number  8    Number of Visits  15    Date for PT Re-Evaluation  06/22/19    Authorization Type  [employer] 15 limitation    Authorization - Visit Number  8    Authorization - Number of Visits  15    Progress Note Due on Visit  10    PT Start Time  0831    PT Stop Time  0913    PT Time Calculation (min)  42 min    Activity Tolerance  Patient tolerated treatment well;No increased pain    Behavior During Therapy  WFL for tasks assessed/performed       Past Medical History:  Diagnosis Date  . Gout   . Hypertension   . Medical history non-contributory   . Wears glasses     Past Surgical History:  Procedure Laterality Date  . KNEE ARTHROSCOPY WITH PATELLAR TENDON REPAIR Left 09/03/2012   Procedure: LEFT KNEE ARTHROSCOPY, LOOSE BODY EXCISION, PATELLAR TENDON REPAIR, EXCISION TIBIAL TUBERCLE SPUR, CHONDROPLASTY PATELLA, EXCISION PLICA;  Surgeon: Loreta Ave, MD;  Location: [facility];  Service: Orthopedics;  Laterality: Left;  . WISDOM TOOTH EXTRACTION      There were no vitals filed for this visit.  Subjective Assessment - 06/02/19 0832    Subjective  Pt states no questions on his HEP no Low back pain today just Rt side pain that started a couple of days ago.    Pertinent History  arthroscopic surgery, HTN, sleep apnea, chronic neck and back pain    Limitations  Sitting;Standing;Walking;Lifting;House hold activities    How long can you sit comfortably?  10-15 minutes    How long can you stand comfortably?  5 minutes n    How long can you walk comfortably?  increased pain after 15 minutes    Patient Stated  Goals  less pain    Currently in Pain?  Yes    Pain Score  3     Pain Location  Thoracic    Pain Orientation  Right    Pain Descriptors / Indicators  Dull    Pain Type  Acute pain    Pain Onset  More than a month ago                       [facility] Adult PT Treatment/Exercise - 06/02/19 0001      Exercises   Exercises  Lumbar      Lumbar Exercises: Stretches   Single Knee to Chest Stretch  --    Lower Trunk Rotation  --    Prone on Elbows Stretch  --    Press Ups  5 reps    Other Lumbar Stretch Exercise  3 D hip  and thoracic excursion    Other Lumbar Stretch Exercise  QL door way stretch, x30 sec each direction      Lumbar Exercises: Supine   Dead Bug  --    Dead Bug Limitations  --    Bridge  --      Lumbar Exercises: Prone   Opposite Arm/Leg Raise  --  Other Prone Lumbar Exercises  rows/shld extension x 10    with 3#  wt      Lumbar Exercises: Quadruped   Straight Leg Raise  5 reps    Opposite Arm/Leg Raise  Right arm/Left leg;Left arm/Right leg;5 reps      Manual Therapy   Manual Therapy  Soft tissue mobilization    Manual therapy comments  done seperate from all other aspects of treatmentt    Soft tissue mobilization  prone Rt thoaracic and lumbar paraspinals into glutes at iliac crest to reduce pain and palpable restriction               PT Short Term Goals - 05/13/19 6712      PT SHORT TERM GOAL #1   Title  Pt to be I in HEP to improve back and hip motion to be able to bend and reach the floor to allow pt to pick items off the ground in his yard.    Time  3    Period  Weeks    Status  On-going    Target Date  06/02/19      PT SHORT TERM GOAL #2   Title  PT to have increased knowledge and be using improved body mechanics for bed mobility and lifting  to have no radiating sx to demonstrate decreased nerve root irritation.    Time  3    Period  Weeks    Status  On-going        PT Long Term Goals - 05/13/19 4580      PT LONG  TERM GOAL #1   Title  Pt to be I in advance HEP to be able to decrease low back pain to no greater than a 2/10 .    Time  6    Period  Weeks    Status  On-going      PT LONG TERM GOAL #2   Title  PT to be able to sit, stand or walk for over 30 mintues at a time to improve his functional ability for yard and housework.    Time  6    Period  Weeks    Status  On-going      PT LONG TERM GOAL #3   Title  PT LE strength to be at least 4+ /5 to increase the ease of standing up from a low lying soft area such as a couch without the use of his UE>    Time  6    Period  Weeks    Status  On-going            Plan - 06/02/19 0855    Clinical Impression Statement  Added quadriped exercises to improve stability with noted difficulty but good techniques.  Added wt to prone exercisees. Manual completed to decrease pain and improve mobility.    Examination-Activity Limitations  Bend;Carry;Squat;Sit;Lift;Stand;Locomotion Level    Examination-Participation Restrictions  Yard Work;Community Activity    Stability/Clinical Decision Making  Stable/Uncomplicated    Rehab Potential  Good    PT Frequency  2x / week    PT Duration  6 weeks    PT Treatment/Interventions  Therapeutic exercise;Therapeutic activities;Balance training;Functional mobility training;Patient/family education;Joint Manipulations;Dry needling;Manual techniques    PT Next Visit Plan  Progress core strengthening and stability to standing lunging and functiona squats.    PT Home Exercise Plan  eval: standing extension, knee to chest, active hamstring stretch and POE; 3/4/: hip and thoracic excursions.3/16:  marching  bridge and dead bug.    Consulted and Agree with Plan of Care  Patient       Patient will benefit from skilled therapeutic intervention in order to improve the following deficits and impairments:  Decreased mobility, Decreased range of motion, Decreased strength, Impaired flexibility, Pain, Improper body mechanics  Visit  Diagnosis: Radiculopathy, lumbar region  Muscle weakness (generalized)     Problem List Patient Active Problem List   Diagnosis Date Noted  . Family history of colon cancer 05/12/2019  . Other intervertebral disc degeneration, lumbar region 05/06/2019  . Essential hypertension 09/01/2018  . Insomnia 10/23/2015  . Obstructive sleep apnea 01/16/2015  . Gout 01/05/2015  . Elevated blood pressure 01/05/2015   Rayetta Humphrey, PT CLT [phone] 06/02/2019, 9:16 AM  [facility] [address] [place], [place], [address] Phone: [phone]   Fax:  [phone]  Name: [name] [name] MRN: [id number] Date of Birth: 01-17-1966

## 2019-06-03 ENCOUNTER — Other Ambulatory Visit: Payer: Self-pay | Admitting: Family Medicine

## 2019-06-07 ENCOUNTER — Other Ambulatory Visit: Payer: Self-pay

## 2019-06-07 ENCOUNTER — Ambulatory Visit (HOSPITAL_COMMUNITY): Admitting: Physical Therapy

## 2019-06-07 ENCOUNTER — Encounter (HOSPITAL_COMMUNITY): Payer: Self-pay | Admitting: Physical Therapy

## 2019-06-07 DIAGNOSIS — M6281 Muscle weakness (generalized): Secondary | ICD-10-CM

## 2019-06-07 DIAGNOSIS — M5416 Radiculopathy, lumbar region: Secondary | ICD-10-CM | POA: Diagnosis not present

## 2019-06-07 NOTE — Therapy (Signed)
[facility] [place], [place], [address] Phone: [phone]   Fax:  [phone]  Physical Therapy Treatment  Patient Details  Name: [name] [name] MRN: [id number] Date of Birth: Nov 29, 1965 Referring Provider (PT): Rodell Perna    Encounter Date: 06/07/2019  PT End of Session - 06/07/19 0916    Visit Number  9    Number of Visits  15    Date for PT Re-Evaluation  06/22/19    Authorization Type  [employer] limitation    Authorization - Visit Number  9    Authorization - Number of Visits  15    Progress Note Due on Visit  10    PT Start Time  3335    PT Stop Time  0917    PT Time Calculation (min)  45 min    Activity Tolerance  Patient tolerated treatment well;No increased pain    Behavior During Therapy  WFL for tasks assessed/performed       Past Medical History:  Diagnosis Date  . Gout   . Hypertension   . Medical history non-contributory   . Wears glasses     Past Surgical History:  Procedure Laterality Date  . KNEE ARTHROSCOPY WITH PATELLAR TENDON REPAIR Left 09/03/2012   Procedure: LEFT KNEE ARTHROSCOPY, LOOSE BODY EXCISION, PATELLAR TENDON REPAIR, EXCISION TIBIAL TUBERCLE SPUR, CHONDROPLASTY PATELLA, EXCISION PLICA;  Surgeon: Ninetta Lights, MD;  Location: [facility];  Service: Orthopedics;  Laterality: Left;  . WISDOM TOOTH EXTRACTION      There were no vitals filed for this visit.  Subjective Assessment - 06/07/19 0836    Subjective  Pt had his grandson yeserday and is sore    Pertinent History  arthroscopic surgery, HTN, sleep apnea, chronic neck and back pain    Limitations  Sitting;Standing;Walking;Lifting;House hold activities    How long can you sit comfortably?  10-15 minutes    How long can you stand comfortably?  5 minutes n    How long can you walk comfortably?  increased pain after 15 minutes    Patient Stated Goals  less pain    Pain Score  2     Pain Location  Back    Pain  Orientation  Lower    Pain Descriptors / Indicators  Aching    Pain Type  Chronic pain    Pain Onset  More than a month ago                       [facility] Adult PT Treatment/Exercise - 06/07/19 0001      Exercises   Exercises  Lumbar      Lumbar Exercises: Stretches   Press Ups  5 reps    Figure 4 Stretch  3 reps;30 seconds    Other Lumbar Stretch Exercise  3 D hip  and thoracic excursion    Other Lumbar Stretch Exercise  QL door way stretch, x30 sec each direction      Lumbar Exercises: Standing   Forward Lunge  10 reps    Scapular Retraction  Both;10 reps;Ambulance person;Theraband    Theraband Level (Row)  Level 3 (Green)    Shoulder Extension  Strengthening;Both;10 reps;Theraband    Theraband Level (Shoulder Extension)  Level 3 (Green)    Other Standing Lumbar Exercises  Palloff x 10 GTB; hip excursions     Other Standing Lumbar Exercises  wall arch  and  Y lift off with green tband , vector stance x 10"      Lumbar Exercises: Prone   Other Prone Lumbar Exercises  rows/shld extension x 10    with 3#  wt      Lumbar Exercises: Quadruped   Straight Leg Raise  5 reps    Opposite Arm/Leg Raise  Right arm/Left leg;Left arm/Right leg;5 reps      Manual Therapy   Manual Therapy  Soft tissue mobilization    Manual therapy comments  done seperate from all other aspects of treatmentt    Soft tissue mobilization  prone Rt thoaracic and lumbar paraspinals into glutes at iliac crest to reduce pain and palpable restriction               PT Short Term Goals - 05/13/19 3762      PT SHORT TERM GOAL #1   Title  Pt to be I in HEP to improve back and hip motion to be able to bend and reach the floor to allow pt to pick items off the ground in his yard.    Time  3    Period  Weeks    Status  On-going    Target Date  06/02/19      PT SHORT TERM GOAL #2   Title  PT to have increased knowledge and be using improved body mechanics for bed mobility and  lifting  to have no radiating sx to demonstrate decreased nerve root irritation.    Time  3    Period  Weeks    Status  On-going        PT Long Term Goals - 05/13/19 8315      PT LONG TERM GOAL #1   Title  Pt to be I in advance HEP to be able to decrease low back pain to no greater than a 2/10 .    Time  6    Period  Weeks    Status  On-going      PT LONG TERM GOAL #2   Title  PT to be able to sit, stand or walk for over 30 mintues at a time to improve his functional ability for yard and housework.    Time  6    Period  Weeks    Status  On-going      PT LONG TERM GOAL #3   Title  PT LE strength to be at least 4+ /5 to increase the ease of standing up from a low lying soft area such as a couch without the use of his UE>    Time  6    Period  Weeks    Status  On-going            Plan - 06/07/19 1761    Clinical Impression Statement  Added functional squat, and lunges to pt program for higher stabilzation as well as wall lift off and wall arch exercises.  Pt given green tband for HEP for postural exercises.    Examination-Activity Limitations  Bend;Carry;Squat;Sit;Lift;Stand;Locomotion Level    Examination-Participation Restrictions  Yard Work;Community Activity    Stability/Clinical Decision Making  Stable/Uncomplicated    Rehab Potential  Good    PT Frequency  2x / week    PT Duration  6 weeks    PT Treatment/Interventions  Therapeutic exercise;Therapeutic activities;Balance training;Functional mobility training;Patient/family education;Joint Manipulations;Dry needling;Manual techniques    PT Next Visit Plan  10th visit progress note, see if pt has any questions on  postural exercises.    PT Home Exercise Plan  eval: standing extension, knee to chest, active hamstring stretch and POE; 3/4/: hip and thoracic excursions.05-28-2022:  marching bridge and dead bug.    Consulted and Agree with Plan of Care  Patient       Patient will benefit from skilled therapeutic intervention  in order to improve the following deficits and impairments:  Decreased mobility, Decreased range of motion, Decreased strength, Impaired flexibility, Pain, Improper body mechanics  Visit Diagnosis: Radiculopathy, lumbar region  Muscle weakness (generalized)     Problem List Patient Active Problem List   Diagnosis Date Noted  . Family history of colon cancer 05/12/2019  . Other intervertebral disc degeneration, lumbar region 05/06/2019  . Essential hypertension 09/01/2018  . Insomnia 10/23/2015  . Obstructive sleep apnea 01/16/2015  . Gout 01/05/2015  . Elevated blood pressure 01/05/2015    Virgina Organ, PT CLT [phone] 06/07/2019, 9:20 AM  [facility] [address] [place], [place], [address] Phone: [phone]   Fax:  [phone]  Name: Zelma J [place]au MRN: [id number] Date of Birth: 1965-10-31

## 2019-06-09 ENCOUNTER — Other Ambulatory Visit: Payer: Self-pay

## 2019-06-09 ENCOUNTER — Ambulatory Visit (HOSPITAL_COMMUNITY): Admitting: Physical Therapy

## 2019-06-09 DIAGNOSIS — M6281 Muscle weakness (generalized): Secondary | ICD-10-CM

## 2019-06-09 DIAGNOSIS — M5416 Radiculopathy, lumbar region: Secondary | ICD-10-CM

## 2019-06-09 NOTE — Therapy (Addendum)
[facility] [place], [place], [address] Phone: [phone]   Fax:  [phone]  Physical Therapy Treatment Progress Note Reporting Period 05/11/2019   to   06/09/2019  See note below for Objective Data and Assessment of Progress/Goals.      Patient Details  Name: [name] [name] MRN: [id number] Date of Birth: [birth year] Referring Provider (PT): Rodell Perna    Encounter Date: 06/09/2019  PT End of Session - 06/09/19 0935    Visit Number  10    Number of Visits  15    Date for PT Re-Evaluation  06/22/19    Authorization Type  [employer] 15 limitation    Authorization - Visit Number  10    Authorization - Number of Visits  15    Progress Note Due on Visit  20    PT Start Time  [phone]   pt was late   PT Stop Time  0925    PT Time Calculation (min)  43 min    Activity Tolerance  Patient tolerated treatment well;No increased pain    Behavior During Therapy  WFL for tasks assessed/performed       Past Medical History:  Diagnosis Date  . Gout   . Hypertension   . Medical history non-contributory   . Wears glasses     Past Surgical History:  Procedure Laterality Date  . KNEE ARTHROSCOPY WITH PATELLAR TENDON REPAIR Left 09/03/2012   Procedure: LEFT KNEE ARTHROSCOPY, LOOSE BODY EXCISION, PATELLAR TENDON REPAIR, EXCISION TIBIAL TUBERCLE SPUR, CHONDROPLASTY PATELLA, EXCISION PLICA;  Surgeon: Ninetta Lights, MD;  Location: [facility];  Service: Orthopedics;  Laterality: Left;  . WISDOM TOOTH EXTRACTION      There were no vitals filed for this visit.  Subjective Assessment - 06/09/19 0850    Subjective  pt reports he feels he is 70% improved at this point.  States overall, pain is decreased. STates he done yard work over the weekend that flared up. States his pain ranges 0-7/10.    Currently in Pain?  Yes    Pain Score  2     Pain Location  Back    Pain Orientation  Lower    Pain Descriptors / Indicators   Aching    Pain Type  Chronic pain         [facility] PT Assessment - 06/09/19 0001      Assessment   Medical Diagnosis  LT lumbar radiculopathy     Referring Provider (PT)  Rodell Perna     Onset Date/Surgical Date  02/23/19    Next MD Visit  06/17/2019      Observation/Other Assessments   Focus on Therapeutic Outcomes (FOTO)   33% affected (was 48% at initial evaluation and 46% at visit #5)      AROM   AROM Assessment Site  Lumbar    Lumbar Flexion  full range with fingers touching floor and no pain (was 5 inches from floor with pain)    Lumbar Extension  15   no pain, was with pain     Strength   Right Hip Flexion  4+/5   was 4/5   Right Hip Extension  4+/5    Right Hip ABduction  5/5   was 5/5   Left Hip Flexion  4+/5   was 3+/5   Left Hip ABduction  5/5   was 5/5   Right Knee Flexion  5/5   was 5/5  Right Knee Extension  5/5   was 5/5   Left Knee Flexion  5/5   was 3+/5   Left Knee Extension  5/5   was 5/5   Right Ankle Dorsiflexion  5/5   was 5/5   Left Ankle Dorsiflexion  5/5   was 4/5                  [facility] Adult PT Treatment/Exercise - 06/09/19 0001      Lumbar Exercises: Prone   Other Prone Lumbar Exercises  rows/shld extension x 10       Manual Therapy   Manual Therapy  Soft tissue mobilization    Manual therapy comments  done seperate from all other aspects of treatmentt    Soft tissue mobilization  prone Rt thoaracic and lumbar paraspinals into glutes at iliac crest to reduce pain and palpable restriction             PT Education - 06/09/19 0935    Education Details  reveiwed proper body mechanics/ lifting techniques.  Explained measures and progress.  Reveiwed goals and remainder of treatment focus.    Person(s) Educated  Patient    Methods  Explanation;Demonstration    Comprehension  Verbalized understanding       PT Short Term Goals - 06/09/19 0920      PT SHORT TERM GOAL #1   Title  Pt to be I in HEP to improve back and  hip motion to be able to bend and reach the floor to allow pt to pick items off the ground in his yard.    Time  3    Period  Weeks    Status  Achieved    Target Date  06/02/19      PT SHORT TERM GOAL #2   Title  PT to have increased knowledge and be using improved body mechanics for bed mobility and lifting  to have no radiating sx to demonstrate decreased nerve root irritation.    Time  3    Period  Weeks    Status  Achieved        PT Long Term Goals - 06/09/19 2094      PT LONG TERM GOAL #1   Title  Pt to be I in advance HEP to be able to decrease low back pain to no greater than a 2/10 .    Time  6    Period  Weeks    Status  On-going      PT LONG TERM GOAL #2   Title  PT to be able to sit, stand or walk for over 30 mintues at a time to improve his functional ability for yard and housework.    Time  6    Period  Weeks    Status  Achieved      PT LONG TERM GOAL #3   Title  PT LE strength to be at least 4+ /5 to increase the ease of standing up from a low lying soft area such as a couch without the use of his UE>    Time  6    Period  Weeks    Status  Achieved            Plan - 06/09/19 1213    Clinical Impression Statement  10th visit progress note and FOTO completed this session.  Pt is overall improving with 70% subjective improvement reported.  Objectively, strength and ROM have improved and radiculopathy has decreased.  Pt has met all STG's and 2/3 LTG's.  Pt has returned working his yard with only slight discomfort noted.  Reviewed proper body mechanics and lifting with pateint with ability to demonstrate appropriately.  Pt would continue to benefit from therapy to improve his form and reduce his pain.    Examination-Activity Limitations  Bend;Carry;Squat;Sit;Lift;Stand;Locomotion Level    Examination-Participation Restrictions  Yard Work;Community Activity    Stability/Clinical Decision Making  Stable/Uncomplicated    Rehab Potential  Good    PT Frequency  2x  / week    PT Duration  6 weeks    PT Treatment/Interventions  Therapeutic exercise;Therapeutic activities;Balance training;Functional mobility training;Patient/family education;Joint Manipulations;Dry needling;Manual techniques    PT Next Visit Plan  Continue with postural and lumbar strengthening, improvement of body mechanics and manual PRN for pain relief.    PT Home Exercise Plan  eval: standing extension, knee to chest, active hamstring stretch and POE; 3/4/: hip and thoracic excursions.2022-06-22:  marching bridge and dead bug.    Consulted and Agree with Plan of Care  Patient       Patient will benefit from skilled therapeutic intervention in order to improve the following deficits and impairments:  Decreased mobility, Decreased range of motion, Decreased strength, Impaired flexibility, Pain, Improper body mechanics  Visit Diagnosis: Radiculopathy, lumbar region  Muscle weakness (generalized)     Problem List Patient Active Problem List   Diagnosis Date Noted  . Family history of colon cancer 05/12/2019  . Other intervertebral disc degeneration, lumbar region 05/06/2019  . Essential hypertension 09/01/2018  . Insomnia 10/23/2015  . Obstructive sleep apnea 01/16/2015  . Gout 01/05/2015  . Elevated blood pressure 01/05/2015   Teena Irani, PTA/CLT [place], PT CLT [phone] 06/09/2019, 12:18 PM  [facility] [address] [place], [place], [address] Phone: [phone]   Fax:  [phone]  Name: Bradden J [name] MRN: [id number] Date of Birth: 1965/10/02

## 2019-06-14 ENCOUNTER — Ambulatory Visit (HOSPITAL_COMMUNITY): Admitting: Physical Therapy

## 2019-06-15 ENCOUNTER — Encounter (HOSPITAL_COMMUNITY): Payer: Self-pay | Admitting: Physical Therapy

## 2019-06-15 ENCOUNTER — Other Ambulatory Visit: Payer: Self-pay

## 2019-06-15 ENCOUNTER — Ambulatory Visit (HOSPITAL_COMMUNITY): Attending: Orthopaedic Surgery | Admitting: Physical Therapy

## 2019-06-15 DIAGNOSIS — M5416 Radiculopathy, lumbar region: Secondary | ICD-10-CM | POA: Insufficient documentation

## 2019-06-15 DIAGNOSIS — M6281 Muscle weakness (generalized): Secondary | ICD-10-CM | POA: Diagnosis present

## 2019-06-15 NOTE — Therapy (Signed)
[facility] [address] [place], [place], [address] Phone: [phone]   Fax:  [phone]  Physical Therapy Treatment  Patient Details  Name: [name] [name] MRN: [id number] Date of Birth: 1965/12/13 Referring Provider (PT): Annell Greening    Encounter Date: 06/15/2019  PT End of Session - 06/15/19 1449    Visit Number  11    Number of Visits  15    Date for PT Re-Evaluation  06/22/19    Authorization Type  [employer] 15 limitation    Authorization - Visit Number  11    Authorization - Number of Visits  15    Progress Note Due on Visit  20    PT Start Time  1405    PT Stop Time  1445    PT Time Calculation (min)  40 min    Activity Tolerance  Patient tolerated treatment well;No increased pain    Behavior During Therapy  WFL for tasks assessed/performed       Past Medical History:  Diagnosis Date  . Gout   . Hypertension   . Medical history non-contributory   . Wears glasses     Past Surgical History:  Procedure Laterality Date  . KNEE ARTHROSCOPY WITH PATELLAR TENDON REPAIR Left 09/03/2012   Procedure: LEFT KNEE ARTHROSCOPY, LOOSE BODY EXCISION, PATELLAR TENDON REPAIR, EXCISION TIBIAL TUBERCLE SPUR, CHONDROPLASTY PATELLA, EXCISION PLICA;  Surgeon: Loreta Ave, MD;  Location: [facility];  Service: Orthopedics;  Laterality: Left;  . WISDOM TOOTH EXTRACTION      There were no vitals filed for this visit.  Subjective Assessment - 06/15/19 1408    Subjective  Pt states that he has been working and is sore.    Pertinent History  arthroscopic surgery, HTN, sleep apnea, chronic neck and back pain    Limitations  Sitting;Standing;Walking;Lifting;House hold activities    How long can you sit comfortably?  10-15 minutes    How long can you stand comfortably?  5 minutes n    How long can you walk comfortably?  increased pain after 15 minutes    Patient Stated Goals  less pain    Currently in Pain?  No/denies    Pain  Onset  More than a month ago            [facility] Adult PT Treatment/Exercise - 06/15/19 0001      Exercises   Exercises  Lumbar      Lumbar Exercises: Stretches   Figure 4 Stretch  3 reps;30 seconds      Lumbar Exercises: Standing   Shoulder Extension  Strengthening;Both;15 reps;Theraband    Theraband Level (Shoulder Extension)  Level 4 (Blue)    Other Standing Lumbar Exercises  Palloff x 10 bluB; hip excursions     Other Standing Lumbar Exercises  vector stance 10" x 3       Lumbar Exercises: Quadruped   [place]/Old [place]  5 reps    Other Quadruped Lumbar Exercises  child pose 20" x 3       Manual Therapy   Manual Therapy  Soft tissue mobilization    Manual therapy comments  done seperate from all other asspects of treatment    Soft tissue mobilization  to improve motion                PT Short Term Goals - 06/09/19 0920      PT SHORT TERM GOAL #1   Title  Pt to be I  in HEP to improve back and hip motion to be able to bend and reach the floor to allow pt to pick items off the ground in his yard.    Time  3    Period  Weeks    Status  Achieved    Target Date  06/02/19      PT SHORT TERM GOAL #2   Title  PT to have increased knowledge and be using improved body mechanics for bed mobility and lifting  to have no radiating sx to demonstrate decreased nerve root irritation.    Time  3    Period  Weeks    Status  Achieved        PT Long Term Goals - 06/09/19 1093      PT LONG TERM GOAL #1   Title  Pt to be I in advance HEP to be able to decrease low back pain to no greater than a 2/10 .    Time  6    Period  Weeks    Status  On-going      PT LONG TERM GOAL #2   Title  PT to be able to sit, stand or walk for over 30 mintues at a time to improve his functional ability for yard and housework.    Time  6    Period  Weeks    Status  Achieved      PT LONG TERM GOAL #3   Title  PT LE strength to be at least 4+ /5 to increase the ease of standing up from a low  lying soft area such as a couch without the use of his UE>    Time  6    Period  Weeks    Status  Achieved            Plan - 06/15/19 1450    Clinical Impression Statement  Added vector stance to exercises with good technique. PT to be seen tomorrow prior to MD visit on Thursday.  Will send 10th visit to MD.    Examination-Activity Limitations  Bend;Carry;Squat;Sit;Lift;Stand;Locomotion Level    Examination-Participation Restrictions  Yard Work;Community Activity    Stability/Clinical Decision Making  Stable/Uncomplicated    Rehab Potential  Good    PT Frequency  2x / week    PT Duration  6 weeks    PT Treatment/Interventions  Therapeutic exercise;Therapeutic activities;Balance training;Functional mobility training;Patient/family education;Joint Manipulations;Dry needling;Manual techniques    PT Next Visit Plan  Begin leg press ; discuss with pt if he wants to continue next weeks appointments.    PT Home Exercise Plan  eval: standing extension, knee to chest, active hamstring stretch and POE; 3/4/: hip and thoracic excursions.06-17-22:  marching bridge and dead bug.    Consulted and Agree with Plan of Care  Patient       Patient will benefit from skilled therapeutic intervention in order to improve the following deficits and impairments:  Decreased mobility, Decreased range of motion, Decreased strength, Impaired flexibility, Pain, Improper body mechanics  Visit Diagnosis: Radiculopathy, lumbar region  Muscle weakness (generalized)     Problem List Patient Active Problem List   Diagnosis Date Noted  . Family history of colon cancer 05/12/2019  . Other intervertebral disc degeneration, lumbar region 05/06/2019  . Essential hypertension 09/01/2018  . Insomnia 10/23/2015  . Obstructive sleep apnea 01/16/2015  . Gout 01/05/2015  . Elevated blood pressure 01/05/2015    Virgina Organ, PT CLT [phone] 06/15/2019, 2:54 PM  [facility]  [facility] [address] [place], [place], [address] Phone: [phone]   Fax:  [phone]  Name: [name] [name] MRN: [id number] Date of Birth: January 29, 1966

## 2019-06-16 ENCOUNTER — Ambulatory Visit (HOSPITAL_COMMUNITY): Admitting: Physical Therapy

## 2019-06-16 ENCOUNTER — Encounter (HOSPITAL_COMMUNITY): Payer: Self-pay | Admitting: Physical Therapy

## 2019-06-16 DIAGNOSIS — M5416 Radiculopathy, lumbar region: Secondary | ICD-10-CM | POA: Diagnosis not present

## 2019-06-16 DIAGNOSIS — M6281 Muscle weakness (generalized): Secondary | ICD-10-CM

## 2019-06-16 NOTE — Therapy (Signed)
[place], [place], [address] Phone: [phone]   Fax:  [phone]  Physical Therapy Treatment  Patient Details  Name: [name] [name] MRN: [id number] Date of Birth: 05/15/1965 Referring Provider (PT): Rodell Perna    Encounter Date: 06/16/2019  PT End of Session - 06/16/19 1314    Visit Number  12    Number of Visits  15    Date for PT Re-Evaluation  06/22/19    Authorization Type  [employer] 15 limitation    Authorization - Visit Number  12    Authorization - Number of Visits  15    Progress Note Due on Visit  20    PT Start Time  0835    PT Stop Time  0915    PT Time Calculation (min)  40 min    Activity Tolerance  Patient tolerated treatment well;No increased pain    Behavior During Therapy  WFL for tasks assessed/performed       Past Medical History:  Diagnosis Date  . Gout   . Hypertension   . Medical history non-contributory   . Wears glasses     Past Surgical History:  Procedure Laterality Date  . KNEE ARTHROSCOPY WITH PATELLAR TENDON REPAIR Left 09/03/2012   Procedure: LEFT KNEE ARTHROSCOPY, LOOSE BODY EXCISION, PATELLAR TENDON REPAIR, EXCISION TIBIAL TUBERCLE SPUR, CHONDROPLASTY PATELLA, EXCISION PLICA;  Surgeon: Ninetta Lights, MD;  Location: [facility];  Service: Orthopedics;  Laterality: Left;  . WISDOM TOOTH EXTRACTION      There were no vitals filed for this visit.  Subjective Assessment - 06/16/19 0835    Subjective  Pt states he is a little sore in his back due to his grandson sleeping with him last night.  Pt is going to the MD tomorrow.    Pertinent History  arthroscopic surgery, HTN, sleep apnea, chronic neck and back pain    Limitations  Sitting;Standing;Walking;Lifting;House hold activities    How long can you sit comfortably?  10-15 minutes    How long can you stand comfortably?  5 minutes n    How long can you walk comfortably?  increased pain after 15 minutes    Patient Stated Goals  less pain    Currently in Pain?  Yes    Pain Score  2     Pain Location  Back    Pain Orientation  Left    Pain Type  Acute pain    Pain Radiating Towards  nno    Pain Onset  More than a month ago                       [facility] Adult PT Treatment/Exercise - 06/16/19 0001      Exercises   Exercises  Lumbar;Knee/Hip      Lumbar Exercises: Stretches   Other Lumbar Stretch Exercise  QL door way stretch, x30 sec each direction      Lumbar Exercises: Machines for Strengthening   Leg Press  7Pl 15 x      Lumbar Exercises: Standing   Other Standing Lumbar Exercises  step up no UE onto stair x 15 EACH      Lumbar Exercises: Supine   Bent Knee Raise  10 reps    Other Supine Lumbar Exercises  toe tap x 5      Lumbar Exercises: Quadruped   Madcat/Old Horse  5 reps    Opposite Arm/Leg Raise  Right  arm/Left leg;Left arm/Right leg;10 reps    Opposite Arm/Leg Raise Limitations  With green t-band     Other Quadruped Lumbar Exercises  child pose 20" x 3       Manual Therapy   Manual Therapy  Soft tissue mobilization    Manual therapy comments  done seperate from all other asspects of treatment    Soft tissue mobilization  to improve motion                PT Short Term Goals - 06/09/19 0920      PT SHORT TERM GOAL #1   Title  Pt to be I in HEP to improve back and hip motion to be able to bend and reach the floor to allow pt to pick items off the ground in his yard.    Time  3    Period  Weeks    Status  Achieved    Target Date  06/02/19      PT SHORT TERM GOAL #2   Title  PT to have increased knowledge and be using improved body mechanics for bed mobility and lifting  to have no radiating sx to demonstrate decreased nerve root irritation.    Time  3    Period  Weeks    Status  Achieved        PT Long Term Goals - 06/09/19 4315      PT LONG TERM GOAL #1   Title  Pt to be I in advance HEP to be able to decrease low back pain to no  greater than a 2/10 .    Time  6    Period  Weeks    Status  On-going      PT LONG TERM GOAL #2   Title  PT to be able to sit, stand or walk for over 30 mintues at a time to improve his functional ability for yard and housework.    Time  6    Period  Weeks    Status  Achieved      PT LONG TERM GOAL #3   Title  PT LE strength to be at least 4+ /5 to increase the ease of standing up from a low lying soft area such as a couch without the use of his UE>    Time  6    Period  Weeks    Status  Achieved            Plan - 06/16/19 1314    Clinical Impression Statement  PT progressed with stabilization exercises with good form with verbal cuing.  PT is more active with less pain at this time but continues to have decreased thoracic/lumbar movement,(tends to move through his hips).  PT will continue to benefit from skilled PT to improve mobility and decrease pain.    Examination-Activity Limitations  Bend;Carry;Squat;Sit;Lift;Stand;Locomotion Level    Examination-Participation Restrictions  Yard Work;Community Activity    Stability/Clinical Decision Making  Stable/Uncomplicated    Rehab Potential  Good    PT Frequency  2x / week    PT Duration  6 weeks    PT Treatment/Interventions  Therapeutic exercise;Therapeutic activities;Balance training;Functional mobility training;Patient/family education;Joint Manipulations;Dry needling;Manual techniques    PT Next Visit Plan  Continue thru next week, add leg press to improve hip extension strength.    PT Home Exercise Plan  eval: standing extension, knee to chest, active hamstring stretch and POE; 3/4/: hip and thoracic excursions.06/10/22:  marching bridge and dead  bug.    Consulted and Agree with Plan of Care  Patient       Patient will benefit from skilled therapeutic intervention in order to improve the following deficits and impairments:  Decreased mobility, Decreased range of motion, Decreased strength, Impaired flexibility, Pain, Improper  body mechanics  Visit Diagnosis: Radiculopathy, lumbar region  Muscle weakness (generalized)     Problem List Patient Active Problem List   Diagnosis Date Noted  . Family history of colon cancer 05/12/2019  . Other intervertebral disc degeneration, lumbar region 05/06/2019  . Essential hypertension 09/01/2018  . Insomnia 10/23/2015  . Obstructive sleep apnea 01/16/2015  . Gout 01/05/2015  . Elevated blood pressure 01/05/2015   Virgina Organ, PT CLT [phone] 06/16/2019, 1:19 PM  [facility] [address] [place], [place], [address] Phone: [phone]   Fax:  [phone]  Name: Michelle J Guinea-B[place]au MRN: [id number] Date of Birth: [birth year]

## 2019-06-17 ENCOUNTER — Encounter: Payer: Self-pay | Admitting: Orthopaedic Surgery

## 2019-06-17 ENCOUNTER — Other Ambulatory Visit: Payer: Self-pay

## 2019-06-17 ENCOUNTER — Ambulatory Visit (INDEPENDENT_AMBULATORY_CARE_PROVIDER_SITE_OTHER): Admitting: Orthopaedic Surgery

## 2019-06-17 VITALS — BP 168/102 | HR 66 | Ht 73.0 in | Wt 255.0 lb

## 2019-06-17 DIAGNOSIS — M5136 Other intervertebral disc degeneration, lumbar region: Secondary | ICD-10-CM

## 2019-06-17 NOTE — Progress Notes (Signed)
Office Visit Note   Patient: [name] [name]           Date of Birth: 07-13-1965           MRN: [id number] Visit Date: 06/17/2019              Requested by: Claretta Fraise, MD [place],  [place] [address] PCP: Claretta Fraise, MD   Assessment & Plan: Visit Diagnoses:  1. Other intervertebral disc degeneration, lumbar region     Plan: Persistent back and left leg symptoms failed conservative treatment including anti-inflammatories more than 4 weeks physical therapy, Medrol Dosepak, stretching exercises.  We will proceed with MRI scan lumbar evaluating for his back and left leg pain and office follow-up after scan. Follow-Up Instructions: Return after lumbar MRI scan.  Orders:  No orders of the defined types were placed in this encounter.  No orders of the defined types were placed in this encounter.     Procedures: No procedures performed   Clinical Data: No additional findings.   Subjective: Chief Complaint  Patient presents with  . Lower Back - Follow-up    HPI 54 year old male returns with ongoing problems with chronic back pain and left leg pain.  Plain radiograph showed some disc degeneration L3-4 and L4-5 and right lumbar curvature approximately 20 degrees.  No spondylolisthesis.  He got temporary relief with a prednisone Dosepak.  Has been through physical therapy taken anti-inflammatories and has persistent back and left leg pain with leg weakness with activities.  Yardwork bending twisting any amount of lifting increases symptoms.  He gets some relief with supine position at times he has great difficulty getting up from the bed or out of a chair.  No associated bowel bladder symptoms.  Review of Systems 14 point systems updated.  Unchanged from 03/25/2019 office visit.  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   Objective: Vital Signs: BP (!) 168/102   Pulse 66   Ht 6\' 1"  (1.854 m)   Wt 255 lb (115.7 kg)   BMI 33.64 kg/m   Physical  Exam Constitutional:      Appearance: He is well-developed.  HENT:     Head: Normocephalic and atraumatic.  Eyes:     Pupils: Pupils are equal, round, and reactive to light.  Neck:     Thyroid: No thyromegaly.     Trachea: No tracheal deviation.  Cardiovascular:     Rate and Rhythm: Normal rate.  Pulmonary:     Effort: Pulmonary effort is normal.     Breath sounds: No wheezing.  Abdominal:     General: Bowel sounds are normal.     Palpations: Abdomen is soft.  Skin:    General: Skin is warm and dry.     Capillary Refill: Capillary refill takes less than 2 seconds.  Neurological:     Mental Status: He is alert and oriented to person, place, and time.  Psychiatric:        Behavior: Behavior normal.        Thought Content: Thought content normal.        Judgment: Judgment normal.     Ortho Exam patient has negative straight leg raising 90 degrees negative logroll to the hips.  Anterior tib gastrocsoleus is strong.  He has some sciatic notch tenderness on the left and tenderness over the sciatic nerve in the posterior thigh with palpation of the hamstrings.  No hamstring or quad weakness.  Specialty Comments:  No  specialty comments available.  Imaging: No results found.   PMFS History: Patient Active Problem List   Diagnosis Date Noted  . Family history of colon cancer 05/12/2019  . Other intervertebral disc degeneration, lumbar region 05/06/2019  . Essential hypertension 09/01/2018  . Insomnia 10/23/2015  . Obstructive sleep apnea 01/16/2015  . Gout 01/05/2015  . Elevated blood pressure 01/05/2015   Past Medical History:  Diagnosis Date  . Gout   . Hypertension   . Medical history non-contributory   . Wears glasses     Family History  Problem Relation Age of Onset  . Colon cancer Mother        Precancerous colon polyps; unsure of actual CRC?  . Diabetes Father   . Hypertension Father   . Heart attack Father     Past Surgical History:  Procedure  Laterality Date  . KNEE ARTHROSCOPY WITH PATELLAR TENDON REPAIR Left 09/03/2012   Procedure: LEFT KNEE ARTHROSCOPY, LOOSE BODY EXCISION, PATELLAR TENDON REPAIR, EXCISION TIBIAL TUBERCLE SPUR, CHONDROPLASTY PATELLA, EXCISION PLICA;  Surgeon: Loreta Ave, MD;  Location: [facility];  Service: Orthopedics;  Laterality: Left;  . WISDOM TOOTH EXTRACTION     Social History   Occupational History  . Not on file  Tobacco Use  . Smoking status: Never Smoker  . Smokeless tobacco: Never Used  Substance and Sexual Activity  . Alcohol use: Yes    Comment: rare  . Drug use: No  . Sexual activity: Not on file

## 2019-06-17 NOTE — Addendum Note (Signed)
Addended by: Rogers Seeds on: 06/17/2019 11:55 AM   Modules accepted: Orders

## 2019-06-21 ENCOUNTER — Other Ambulatory Visit: Payer: Self-pay

## 2019-06-21 ENCOUNTER — Encounter (HOSPITAL_COMMUNITY): Payer: Self-pay | Admitting: Physical Therapy

## 2019-06-21 ENCOUNTER — Ambulatory Visit (HOSPITAL_COMMUNITY): Admitting: Physical Therapy

## 2019-06-21 DIAGNOSIS — M5416 Radiculopathy, lumbar region: Secondary | ICD-10-CM | POA: Diagnosis not present

## 2019-06-21 DIAGNOSIS — M6281 Muscle weakness (generalized): Secondary | ICD-10-CM

## 2019-06-21 NOTE — Therapy (Signed)
[facility] [address] [place], [place], [address] Phone: [phone]   Fax:  [phone]  Physical Therapy Treatment  Patient Details  Name: [name] [name] MRN: [id number] Date of Birth: 1965/04/06 Referring Provider (PT): Annell Greening    Encounter Date: 06/21/2019  PT End of Session - 06/21/19 0942    Visit Number  13    Number of Visits  15    Date for PT Re-Evaluation  06/22/19    Authorization Type  [employer] 15 limitation    Authorization - Visit Number  13    Authorization - Number of Visits  15    Progress Note Due on Visit  20    PT Start Time  0835    PT Stop Time  0917    PT Time Calculation (min)  42 min    Activity Tolerance  Patient tolerated treatment well;No increased pain    Behavior During Therapy  WFL for tasks assessed/performed       Past Medical History:  Diagnosis Date  . Gout   . Hypertension   . Medical history non-contributory   . Wears glasses     Past Surgical History:  Procedure Laterality Date  . KNEE ARTHROSCOPY WITH PATELLAR TENDON REPAIR Left 09/03/2012   Procedure: LEFT KNEE ARTHROSCOPY, LOOSE BODY EXCISION, PATELLAR TENDON REPAIR, EXCISION TIBIAL TUBERCLE SPUR, CHONDROPLASTY PATELLA, EXCISION PLICA;  Surgeon: Loreta Ave, MD;  Location: [facility];  Service: Orthopedics;  Laterality: Left;  . WISDOM TOOTH EXTRACTION      There were no vitals filed for this visit.  Subjective Assessment - 06/21/19 0847    Subjective  Pt  states that he is not really feeling any pain today he just feels tight; only 2 hrs of sleep due to his grandson keeping him up.    Pertinent History  arthroscopic surgery, HTN, sleep apnea, chronic neck and back pain    Limitations  Sitting;Standing;Walking;Lifting;House hold activities    How long can you sit comfortably?  10-15 minutes    How long can you stand comfortably?  5 minutes n    How long can you walk comfortably?  increased pain after 15  minutes    Patient Stated Goals  less pain    Currently in Pain?  No/denies    Pain Onset  More than a month ago                       [facility] Adult PT Treatment/Exercise - 06/21/19 0001      Exercises   Exercises  Lumbar      Lumbar Exercises: Stretches   Active Hamstring Stretch  3 reps;30 seconds    Single Knee to Chest Stretch  Right;Left;3 reps;30 seconds    Lower Trunk Rotation  5 reps    Figure 4 Stretch  3 reps;30 seconds      Lumbar Exercises: Standing   Other Standing Lumbar Exercises  Palloff x 10 bluB; hip excursions     Other Standing Lumbar Exercises  proper lifting       Lumbar Exercises: Quadruped   Madcat/Old Horse  5 reps    Other Quadruped Lumbar Exercises  child pose 20" x 3       Manual Therapy   Manual Therapy  Soft tissue mobilization    Manual therapy comments  done seperate from all other asspects of treatment    Joint Mobilization  Grade II to thoracic area  Soft tissue mobilization  to improve motion              PT Education - 07-14-2019 0942    Education Details  updated HEP       PT Short Term Goals - 06/09/19 0920      PT SHORT TERM GOAL #1   Title  Pt to be I in HEP to improve back and hip motion to be able to bend and reach the floor to allow pt to pick items off the ground in his yard.    Time  3    Period  Weeks    Status  Achieved    Target Date  06/02/19      PT SHORT TERM GOAL #2   Title  PT to have increased knowledge and be using improved body mechanics for bed mobility and lifting  to have no radiating sx to demonstrate decreased nerve root irritation.    Time  3    Period  Weeks    Status  Achieved        PT Long Term Goals - 06/09/19 6314      PT LONG TERM GOAL #1   Title  Pt to be I in advance HEP to be able to decrease low back pain to no greater than a 2/10 .    Time  6    Period  Weeks    Status  On-going      PT LONG TERM GOAL #2   Title  PT to be able to sit, stand or walk for over  30 mintues at a time to improve his functional ability for yard and housework.    Time  6    Period  Weeks    Status  Achieved      PT LONG TERM GOAL #3   Title  PT LE strength to be at least 4+ /5 to increase the ease of standing up from a low lying soft area such as a couch without the use of his UE>    Time  6    Period  Weeks    Status  Achieved            Plan - 07-14-19 0943    Clinical Impression Statement  Pt to department with only 2 hr of sleep due to grandson keeping him up last night.  Therapist reviewed Paloff and body mechanics briefly, then due to decreased sleep and pt complaint of stiffness treatment focused on stretches and manual.    Examination-Activity Limitations  Bend;Carry;Squat;Sit;Lift;Stand;Locomotion Level    Examination-Participation Restrictions  Yard Work;Community Activity    Stability/Clinical Decision Making  Stable/Uncomplicated    Rehab Potential  Good    PT Frequency  2x / week    PT Duration  6 weeks    PT Treatment/Interventions  Therapeutic exercise;Therapeutic activities;Balance training;Functional mobility training;Patient/family education;Joint Manipulations;Dry needling;Manual techniques    PT Next Visit Plan  Reassess leg strength, if needed add tband exercisese to improve specific mm weaknessm foto and discharge    PT Home Exercise Plan  eval: standing extension, knee to chest, active hamstring stretch and POE; 3/4/: hip and thoracic excursions.17-Jun-2022:  marching bridge and dead bug.; 07-14-2019: mad cat, childs pose as well as pallof.    Consulted and Agree with Plan of Care  Patient       Patient will benefit from skilled therapeutic intervention in order to improve the following deficits and impairments:  Decreased mobility, Decreased range of motion,  Decreased strength, Impaired flexibility, Pain, Improper body mechanics  Visit Diagnosis: Radiculopathy, lumbar region  Muscle weakness (generalized)     Problem List Patient  Active Problem List   Diagnosis Date Noted  . Family history of colon cancer 05/12/2019  . Other intervertebral disc degeneration, lumbar region 05/06/2019  . Essential hypertension 09/01/2018  . Insomnia 10/23/2015  . Obstructive sleep apnea 01/16/2015  . Gout 01/05/2015  . Elevated blood pressure 01/05/2015    Virgina Organ, PT CLT [phone] 06/21/2019, 9:49 AM  [facility] [address] [place], [place], [address] Phone: [phone]   Fax:  [phone]  Name: [name] [name] MRN: [id number] Date of Birth: [birth year]

## 2019-06-23 ENCOUNTER — Other Ambulatory Visit: Payer: Self-pay

## 2019-06-23 ENCOUNTER — Ambulatory Visit (HOSPITAL_COMMUNITY): Admitting: Physical Therapy

## 2019-06-23 DIAGNOSIS — M5416 Radiculopathy, lumbar region: Secondary | ICD-10-CM

## 2019-06-23 DIAGNOSIS — M6281 Muscle weakness (generalized): Secondary | ICD-10-CM

## 2019-06-23 NOTE — Therapy (Signed)
[facility] [place], [place], [address] Phone: [phone]   Fax:  [phone]  Physical Therapy Treatment  Patient Details  Name: [name] [name] MRN: [id number] Date of Birth: 1965-12-05 Referring Provider (PT): Rodell Perna    Encounter Date: 06/23/2019 PHYSICAL THERAPY DISCHARGE SUMMARY  Visits from Start of Care: 14  Current functional level related to goals / functional outcomes: See below   Remaining deficits: Stiffness after doing moderate to heavy activity   Education / Equipment: HEP to include stretches and walking  Plan: Patient agrees to discharge.  Patient goals were not met. Patient is being discharged due to meeting the stated rehab goals.  ?????     PT End of Session - 06/23/19 0926    Visit Number  14    Number of Visits  14    Authorization Type  [employer] 15 limitation    Authorization - Number of Visits  14       Past Medical History:  Diagnosis Date  . Gout   . Hypertension   . Medical history non-contributory   . Wears glasses     Past Surgical History:  Procedure Laterality Date  . KNEE ARTHROSCOPY WITH PATELLAR TENDON REPAIR Left 09/03/2012   Procedure: LEFT KNEE ARTHROSCOPY, LOOSE BODY EXCISION, PATELLAR TENDON REPAIR, EXCISION TIBIAL TUBERCLE SPUR, CHONDROPLASTY PATELLA, EXCISION PLICA;  Surgeon: Ninetta Lights, MD;  Location: [facility];  Service: Orthopedics;  Laterality: Left;  . WISDOM TOOTH EXTRACTION      There were no vitals filed for this visit.  Subjective Assessment - 06/23/19 0847    Subjective  PT states he feels he is more flexible; but if he does a lot of work he still has has increased pain after he does heavy work.    Pertinent History  arthroscopic surgery, HTN, sleep apnea, chronic neck and back pain    Limitations  Sitting;Standing;Walking;Lifting;House hold activities    How long can you sit comfortably?  80mn to an hour; 10-15 minutes    How  long can you stand comfortably?  30 minutes was 5 minutes    How long can you walk comfortably?  30- 40 minutes; increased pain after 15 minutes    Patient Stated Goals  less pain    Currently in Pain?  Yes    Pain Score  5     Pain Location  Back    Pain Orientation  Lower    Pain Descriptors / Indicators  Tightness    Pain Type  Chronic pain    Pain Onset  More than a month ago    Aggravating Factors   sitting    Pain Relieving Factors  stretches         [facility] PT Assessment - 06/23/19 0001      Assessment   Medical Diagnosis  LT lumbar radiculopathy     Referring Provider (PT)  MRodell Perna    Onset Date/Surgical Date  02/23/19   this episode   Next MD Visit  06/17/2019    Prior Therapy  no      Precautions   Precautions  None      Prior Function   Level of Independence  Independent    Vocation  Retired    Leisure  fishing, reading       Cognition   Overall Cognitive Status  Within Functional Limits for tasks assessed      Observation/Other Assessments   Focus on  Therapeutic Outcomes (FOTO)   72; 18% affected       Functional Tests   Functional tests  Single leg stance;Sit to Stand      Single Leg Stance   Comments  60" B       Posture/Postural Control   Posture/Postural Control  No significant limitations    Posture Comments  protruding abdominal       AROM   Lumbar Flexion  finges to the floor was 5 " from the floor    flat back pt moving from hips; back pain increases leg same    Lumbar Extension  27   was 15      Strength   Right Hip Flexion  4/5   was 4/5    Right Hip Extension  4-/5   was 4-/5   Right Hip ABduction  5/5    Left Hip Flexion  5/5   was 3+   Left Hip Extension  5/5   was 3+   Left Hip ABduction  5/5    Right Knee Flexion  5/5    Right Knee Extension  5/5    Left Knee Flexion  5/5   was 3+   Left Knee Extension  5/5    Right Ankle Dorsiflexion  5/5    Left Ankle Dorsiflexion  5/5   was 4/5                    [facility] Adult PT Treatment/Exercise - 06/23/19 0001      Manual Therapy   Manual Therapy  Soft tissue mobilization    Manual therapy comments  done seperate from all other asspects of treatment    Joint Mobilization  Grade II to thoracic area     Soft tissue mobilization  to improve motion              PT Education - 06/23/19 0923    Education Details  Educated pt that his strength in his legs is now back to normal.  PT will benefit from continuing t-band postrual and Pallof exercises as well as stretches and walking.  PT verbalized understanding.  Explained that walking is not only a benefit to his back but decrases sugar levels, BP and improves cardiovascular efficiency.  PT verbalized understanding    Person(s) Educated  Patient    Methods  Explanation    Comprehension  Verbalized understanding       PT Short Term Goals - 06/09/19 0920      PT SHORT TERM GOAL #1   Title  Pt to be I in HEP to improve back and hip motion to be able to bend and reach the floor to allow pt to pick items off the ground in his yard.    Time  3    Period  Weeks    Status  Achieved    Target Date  06/02/19      PT SHORT TERM GOAL #2   Title  PT to have increased knowledge and be using improved body mechanics for bed mobility and lifting  to have no radiating sx to demonstrate decreased nerve root irritation.    Time  3    Period  Weeks    Status  Achieved        PT Long Term Goals - 06/09/19 4801      PT LONG TERM GOAL #1   Title  Pt to be I in advance HEP to be able to decrease low back  pain to no greater than a 2/10 .    Time  6    Period  Weeks    Status  On-going      PT LONG TERM GOAL #2   Title  PT to be able to sit, stand or walk for over 30 mintues at a time to improve his functional ability for yard and housework.    Time  6    Period  Weeks    Status  Achieved      PT LONG TERM GOAL #3   Title  PT LE strength to be at least 4+ /5 to increase the  ease of standing up from a low lying soft area such as a couch without the use of his UE>    Time  6    Period  Weeks    Status  Achieved            Plan - 06/23/19 0926    Clinical Impression Statement  PT reassessed with noted improvement in all aspects.  Therapist urged pt to stretch for 10-15 minutes after doing hard physical work as this is when he has the most discomfort.  Discussed the importance of posture and stretching to keep pt pain at a limited level.  PT verbalized understanding.  PT discharged to a home exercise routine.    Examination-Activity Limitations  Bend;Carry;Squat;Sit;Lift;Stand;Locomotion Level    Examination-Participation Restrictions  Yard Work;Community Activity    Stability/Clinical Decision Making  Stable/Uncomplicated    Rehab Potential  Good    PT Frequency  2x / week    PT Duration  6 weeks    PT Treatment/Interventions  Therapeutic exercise;Therapeutic activities;Balance training;Functional mobility training;Patient/family education;Joint Manipulations;Dry needling;Manual techniques    PT Next Visit Plan  Discharge    PT Home Exercise Plan  eval: standing extension, knee to chest, active hamstring stretch and POE; 3/4/: hip and thoracic excursions.June 04, 2022:  marching bridge and dead bug.; Jul 01, 2019: mad cat, childs pose as well as pallof.    Consulted and Agree with Plan of Care  Patient       Patient will benefit from skilled therapeutic intervention in order to improve the following deficits and impairments:  Decreased mobility, Decreased range of motion, Decreased strength, Impaired flexibility, Pain, Improper body mechanics  Visit Diagnosis: Radiculopathy, lumbar region  Muscle weakness (generalized)     Problem List Patient Active Problem List   Diagnosis Date Noted  . Family history of colon cancer 05/12/2019  . Other intervertebral disc degeneration, lumbar region 05/06/2019  . Essential hypertension 09/01/2018  . Insomnia 10/23/2015   . Obstructive sleep apnea 01/16/2015  . Gout 01/05/2015  . Elevated blood pressure 01/05/2015    Rayetta Humphrey, PT CLT [phone] 06/23/2019, 9:30 AM  [facility] [address] [place], [place], [address] Phone: [phone]   Fax:  [phone]  Name: Richad J [name] MRN: [id number] Date of Birth: [birth year]

## 2019-07-06 ENCOUNTER — Ambulatory Visit: Attending: Internal Medicine

## 2019-07-06 DIAGNOSIS — Z23 Encounter for immunization: Secondary | ICD-10-CM

## 2019-07-06 NOTE — Progress Notes (Signed)
   Covid-19 Vaccination Clinic  Name:  [name] [name]    MRN: [id number] DOB: Jul 24, 1965  07/06/2019  [name] was observed post Covid-19 immunization for 15 minutes without incident. He was provided with Vaccine Information Sheet and instruction to access the V-Safe system.   [name] was instructed to call 911 with any severe reactions post vaccine: Marland Kitchen Difficulty breathing  . Swelling of face and throat  . A fast heartbeat  . A bad rash all over body  . Dizziness and weakness   Immunizations Administered    Name Date Dose VIS Date Route   Moderna COVID-19 Vaccine 07/06/2019  8:37 AM 0.5 mL 02/2019 Intramuscular   Manufacturer: Moderna   Lot: [id number]   NDC: 61470-929-57

## 2019-07-13 ENCOUNTER — Ambulatory Visit (HOSPITAL_COMMUNITY)
Admission: RE | Admit: 2019-07-13 | Discharge: 2019-07-13 | Disposition: A | Discharge status: Home / Self Care | Source: Ambulatory Visit | Attending: Orthopaedic Surgery | Admitting: Orthopaedic Surgery

## 2019-07-13 ENCOUNTER — Other Ambulatory Visit: Payer: Self-pay

## 2019-07-13 DIAGNOSIS — M5136 Other intervertebral disc degeneration, lumbar region: Secondary | ICD-10-CM | POA: Diagnosis present

## 2019-07-28 NOTE — Patient Instructions (Signed)
[name] Gauger [place] Jr.  07/28/2019     @PREFPERIOPPHARMACY @   Your procedure is scheduled on  08/02/2019 .  Report to 08/04/2019 at  0745  A.M.  Call this number if you have problems the morning of surgery:  [phone]   Remember:  Follow the diet and prep instructions given to you by Dr [phone] office.                      Take these medicines the morning of surgery with A SIP OF WATER  Amlodipine, metoprolol, nabumetone.    Do not wear jewelry, make-up or nail polish.  Do not wear lotions, powders, or perfumes. Please wear deodorant and brush your teeth.  Do not shave 48 hours prior to surgery.  Men may shave face and neck.  Do not bring valuables to the hospital.  [facility] is not responsible for any belongings or valuables.  Contacts, dentures or bridgework may not be worn into surgery.  Leave your suitcase in the car.  After surgery it may be brought to your room.  For patients admitted to the hospital, discharge time will be determined by your treatment team.  Patients discharged the day of surgery will not be allowed to drive home.   Name and phone number of your driver:   family Special instructions:  DO NOT smoke the morning of your procedure.  Please read over the following fact sheets that you were given. Anesthesia Post-op Instructions and Care and Recovery After Surgery       Colonoscopy, Adult, Care After This sheet gives you information about how to care for yourself after your procedure. Your health care provider may also give you more specific instructions. If you have problems or questions, contact your health care provider. What can I expect after the procedure? After the procedure, it is common to have:  A small amount of blood in your stool for 24 hours after the procedure.  Some gas.  Mild cramping or bloating of your abdomen. Follow these instructions at home: Eating and drinking   Drink enough fluid to keep your urine pale  yellow.  Follow instructions from your health care provider about eating or drinking restrictions.  Resume your normal diet as instructed by your health care provider. Avoid heavy or fried foods that are hard to digest. Activity  Rest as told by your health care provider.  Avoid sitting for a long time without moving. Get up to take short walks every 1-2 hours. This is important to improve blood flow and breathing. Ask for help if you feel weak or unsteady.  Return to your normal activities as told by your health care provider. Ask your health care provider what activities are safe for you. Managing cramping and bloating   Try walking around when you have cramps or feel bloated.  Apply heat to your abdomen as told by your health care provider. Use the heat source that your health care provider recommends, such as a moist heat pack or a heating pad. ? Place a towel between your skin and the heat source. ? Leave the heat on for 20-30 minutes. ? Remove the heat if your skin turns bright red. This is especially important if you are unable to feel pain, heat, or cold. You may have a greater risk of getting burned. General instructions  For the first 24 hours after the procedure: ? Do not drive or use machinery. ?  Do not sign important documents. ? Do not drink alcohol. ? Do your regular daily activities at a slower pace than normal. ? Eat soft foods that are easy to digest.  Take over-the-counter and prescription medicines only as told by your health care provider.  Keep all follow-up visits as told by your health care provider. This is important. Contact a health care provider if:  You have blood in your stool 2-3 days after the procedure. Get help right away if you have:  More than a small spotting of blood in your stool.  Large blood clots in your stool.  Swelling of your abdomen.  Nausea or vomiting.  A fever.  Increasing pain in your abdomen that is not relieved with  medicine. Summary  After the procedure, it is common to have a small amount of blood in your stool. You may also have mild cramping and bloating of your abdomen.  For the first 24 hours after the procedure, do not drive or use machinery, sign important documents, or drink alcohol.  Get help right away if you have a lot of blood in your stool, nausea or vomiting, a fever, or increased pain in your abdomen. This information is not intended to replace advice given to you by your health care provider. Make sure you discuss any questions you have with your health care provider. Document Revised: 09/21/2018 Document Reviewed: 09/21/2018 Elsevier Patient Education  [place] After These instructions provide you with information about caring for yourself after your procedure. Your health care provider may also give you more specific instructions. Your treatment has been planned according to current medical practices, but problems sometimes occur. Call your health care provider if you have any problems or questions after your procedure. What can I expect after the procedure? After your procedure, you may:  Feel sleepy for several hours.  Feel clumsy and have poor balance for several hours.  Feel forgetful about what happened after the procedure.  Have poor judgment for several hours.  Feel nauseous or vomit.  Have a sore throat if you had a breathing tube during the procedure. Follow these instructions at home: For at least 24 hours after the procedure:      Have a responsible adult stay with you. It is important to have someone help care for you until you are awake and alert.  Rest as needed.  Do not: ? Participate in activities in which you could fall or become injured. ? Drive. ? Use heavy machinery. ? Drink alcohol. ? Take sleeping pills or medicines that cause drowsiness. ? Make important decisions or sign legal documents. ? Take care  of children on your own. Eating and drinking  Follow the diet that is recommended by your health care provider.  If you vomit, drink water, juice, or soup when you can drink without vomiting.  Make sure you have little or no nausea before eating solid foods. General instructions  Take over-the-counter and prescription medicines only as told by your health care provider.  If you have sleep apnea, surgery and certain medicines can increase your risk for breathing problems. Follow instructions from your health care provider about wearing your sleep device: ? Anytime you are sleeping, including during daytime naps. ? While taking prescription pain medicines, sleeping medicines, or medicines that make you drowsy.  If you smoke, do not smoke without supervision.  Keep all follow-up visits as told by your health care provider. This is important. Contact a health  care provider if:  You keep feeling nauseous or you keep vomiting.  You feel light-headed.  You develop a rash.  You have a fever. Get help right away if:  You have trouble breathing. Summary  For several hours after your procedure, you may feel sleepy and have poor judgment.  Have a responsible adult stay with you for at least 24 hours or until you are awake and alert. This information is not intended to replace advice given to you by your health care provider. Make sure you discuss any questions you have with your health care provider. Document Revised: 05/26/2017 Document Reviewed: 06/18/2015 Elsevier Patient Education  [place].

## 2019-07-30 ENCOUNTER — Other Ambulatory Visit: Payer: Self-pay

## 2019-07-30 ENCOUNTER — Other Ambulatory Visit (HOSPITAL_COMMUNITY)
Admission: RE | Admit: 2019-07-30 | Discharge: 2019-07-30 | Disposition: A | Discharge status: Home / Self Care | Source: Ambulatory Visit | Attending: Internal Medicine | Admitting: Internal Medicine

## 2019-07-30 ENCOUNTER — Encounter (HOSPITAL_COMMUNITY)
Admission: RE | Admit: 2019-07-30 | Discharge: 2019-07-30 | Disposition: A | Discharge status: Home / Self Care | Source: Ambulatory Visit | Attending: Internal Medicine | Admitting: Internal Medicine

## 2019-07-30 DIAGNOSIS — Z01812 Encounter for preprocedural laboratory examination: Secondary | ICD-10-CM | POA: Diagnosis present

## 2019-07-30 DIAGNOSIS — Z20822 Contact with and (suspected) exposure to covid-19: Secondary | ICD-10-CM | POA: Insufficient documentation

## 2019-07-31 LAB — SARS CORONAVIRUS 2 (TAT 6-24 HRS): SARS Coronavirus 2: NEGATIVE

## 2019-08-02 ENCOUNTER — Encounter (HOSPITAL_COMMUNITY): Payer: Self-pay | Admitting: Internal Medicine

## 2019-08-02 ENCOUNTER — Ambulatory Visit (HOSPITAL_COMMUNITY): Admitting: Anesthesiology

## 2019-08-02 ENCOUNTER — Encounter (HOSPITAL_COMMUNITY)
Admission: RE | Disposition: A | Discharge status: Home / Self Care | Payer: Self-pay | Source: Home / Self Care | Attending: Internal Medicine

## 2019-08-02 ENCOUNTER — Ambulatory Visit (HOSPITAL_COMMUNITY)
Admission: RE | Admit: 2019-08-02 | Discharge: 2019-08-02 | Disposition: A | Discharge status: Home / Self Care | Attending: Internal Medicine | Admitting: Internal Medicine

## 2019-08-02 ENCOUNTER — Other Ambulatory Visit: Payer: Self-pay

## 2019-08-02 DIAGNOSIS — Z833 Family history of diabetes mellitus: Secondary | ICD-10-CM | POA: Diagnosis not present

## 2019-08-02 DIAGNOSIS — Z8371 Family history of colonic polyps: Secondary | ICD-10-CM

## 2019-08-02 DIAGNOSIS — Z79899 Other long term (current) drug therapy: Secondary | ICD-10-CM | POA: Insufficient documentation

## 2019-08-02 DIAGNOSIS — Z8249 Family history of ischemic heart disease and other diseases of the circulatory system: Secondary | ICD-10-CM | POA: Diagnosis not present

## 2019-08-02 DIAGNOSIS — I10 Essential (primary) hypertension: Secondary | ICD-10-CM | POA: Insufficient documentation

## 2019-08-02 DIAGNOSIS — G4730 Sleep apnea, unspecified: Secondary | ICD-10-CM | POA: Insufficient documentation

## 2019-08-02 DIAGNOSIS — Z791 Long term (current) use of non-steroidal anti-inflammatories (NSAID): Secondary | ICD-10-CM | POA: Insufficient documentation

## 2019-08-02 DIAGNOSIS — M109 Gout, unspecified: Secondary | ICD-10-CM | POA: Diagnosis not present

## 2019-08-02 DIAGNOSIS — Z882 Allergy status to sulfonamides status: Secondary | ICD-10-CM | POA: Insufficient documentation

## 2019-08-02 DIAGNOSIS — K5730 Diverticulosis of large intestine without perforation or abscess without bleeding: Secondary | ICD-10-CM | POA: Insufficient documentation

## 2019-08-02 DIAGNOSIS — M1990 Unspecified osteoarthritis, unspecified site: Secondary | ICD-10-CM | POA: Insufficient documentation

## 2019-08-02 DIAGNOSIS — Z1211 Encounter for screening for malignant neoplasm of colon: Secondary | ICD-10-CM | POA: Diagnosis present

## 2019-08-02 HISTORY — PX: COLONOSCOPY WITH PROPOFOL: SHX5780

## 2019-08-02 SURGERY — COLONOSCOPY WITH PROPOFOL
Anesthesia: General

## 2019-08-02 MED ORDER — CHLORHEXIDINE GLUCONATE 0.12 % MT SOLN: 15.0000 mL | Freq: Once | OROMUCOSAL | Status: DC

## 2019-08-02 MED ORDER — PROPOFOL 10 MG/ML IV BOLUS
INTRAVENOUS | Status: AC
  Filled 2019-08-02: qty 40

## 2019-08-02 MED ORDER — LACTATED RINGERS IV SOLN: Freq: Once | INTRAVENOUS | Status: AC

## 2019-08-02 MED ORDER — PROPOFOL 500 MG/50ML IV EMUL
INTRAVENOUS | Status: DC | PRN
  Administered 2019-08-02: 150 ug/kg/min via INTRAVENOUS

## 2019-08-02 MED ORDER — KETAMINE HCL 50 MG/5ML IJ SOSY
PREFILLED_SYRINGE | INTRAMUSCULAR | Status: AC
  Filled 2019-08-02: qty 5

## 2019-08-02 MED ORDER — CHLORHEXIDINE GLUCONATE CLOTH 2 % EX PADS: 6.0000 | MEDICATED_PAD | Freq: Once | CUTANEOUS | Status: DC

## 2019-08-02 MED ORDER — LACTATED RINGERS IV SOLN
INTRAVENOUS | Status: DC | PRN
Start: 2019-08-02 — End: 2019-08-02

## 2019-08-02 MED ORDER — ORAL CARE MOUTH RINSE: 15.0000 mL | Freq: Once | OROMUCOSAL | Status: DC

## 2019-08-02 MED ORDER — PROPOFOL 10 MG/ML IV BOLUS
INTRAVENOUS | Status: DC | PRN
  Administered 2019-08-02: 30 mg via INTRAVENOUS
  Administered 2019-08-02 (×2): 50 mg via INTRAVENOUS

## 2019-08-02 NOTE — H&P (Signed)
@LOGO @   Primary Care Physician:  Claretta Fraise, MD Primary Gastroenterologist:  Dr. Gala Romney  Pre-Procedure History & Physical: HPI:  [name] J Iran Jr. is a 54 y.o. male here for for high risk surveillance colonoscopy.  Mother with precancerous colonic polyps (43s or 92s)?  No bowel symptoms currently.  Past Medical History:  Diagnosis Date  . Gout   . Hypertension   . Medical history non-contributory   . Wears glasses     Past Surgical History:  Procedure Laterality Date  . KNEE ARTHROSCOPY WITH PATELLAR TENDON REPAIR Left 09/03/2012   Procedure: LEFT KNEE ARTHROSCOPY, LOOSE BODY EXCISION, PATELLAR TENDON REPAIR, EXCISION TIBIAL TUBERCLE SPUR, CHONDROPLASTY PATELLA, EXCISION PLICA;  Surgeon: Ninetta Lights, MD;  Location: [facility];  Service: Orthopedics;  Laterality: Left;  . WISDOM TOOTH EXTRACTION      Prior to Admission medications   Medication Sig Start Date End Date Taking? Authorizing Provider  acetaminophen (TYLENOL) 500 MG tablet Take 500 mg by mouth every 6 (six) hours as needed for mild pain or headache.    Yes [provider]  allopurinol (ZYLOPRIM) 300 MG tablet TAKE 1 TABLET AT BEDTIME Patient taking differently: Take 300 mg by mouth at bedtime.  10/21/18  Yes Stacks, Cletus Gash, MD  amLODipine (NORVASC) 2.5 MG tablet TAKE 1 TABLET(2.5 MG) BY MOUTH DAILY Patient taking differently: Take 2.5 mg by mouth daily.  06/03/19  Yes Stacks, Cletus Gash, MD  Eszopiclone 3 MG TABS TAKE 1 TABLET BY MOUTH EVERY NIGHT AT BEDTIME Patient taking differently: Take 3 mg by mouth at bedtime.  04/15/19  Yes Stacks, Cletus Gash, MD  metoprolol succinate (TOPROL-XL) 100 MG 24 hr tablet TAKE 1 TABLET BY MOUTH EVERY DAY WITH OR IMMEDIATEKY FOLLOWING A MEAL Patient taking differently: Take 100 mg by mouth daily.  06/03/19  Yes Stacks, Cletus Gash, MD  nabumetone (RELAFEN) 500 MG tablet TAKE 2 TABLETS(1000 MG) BY MOUTH MUSCLE TWICE DAILY FOR JOINT PAIN Patient taking differently: Take 1,000  mg by mouth 2 (two) times daily.  04/26/19  Yes Stacks, Cletus Gash, MD  Vitamin D, Ergocalciferol, (DRISDOL) 1.25 MG (50000 UNIT) CAPS capsule Take 1 capsule (50,000 Units total) by mouth every 7 (seven) days. Patient taking differently: Take 50,000 Units by mouth every Tuesday.  03/22/19  Yes Stacks, Cletus Gash, MD  polyethylene glycol-electrolytes (TRILYTE) 420 g solution Take 4,000 mLs by mouth as directed. 05/12/19   Daneil Dolin, MD    Allergies as of 05/12/2019 - Review Complete 05/12/2019  Allergen Reaction Noted  . Sulfa antibiotics  08/06/2012    Family History  Problem Relation Age of Onset  . Colon cancer Mother        Precancerous colon polyps; unsure of actual CRC?  . Diabetes Father   . Hypertension Father   . Heart attack Father     Social History   Socioeconomic History  . Marital status: Married    Spouse name: Not on file  . Number of children: Not on file  . Years of education: Not on file  . Highest education level: Not on file  Occupational History  . Not on file  Tobacco Use  . Smoking status: Never Smoker  . Smokeless tobacco: Never Used  Substance and Sexual Activity  . Alcohol use: Yes    Comment: rare  . Drug use: No  . Sexual activity: Not on file  Other Topics Concern  . Not on file  Social History Narrative  . Not on file   Social Determinants  of Health   Financial Resource Strain:   . Difficulty of Paying Living Expenses:   Food Insecurity:   . Worried About Programme researcher, broadcasting/film/video in the Last Year:   . Barista in the Last Year:   Transportation Needs:   . Freight forwarder (Medical):   Marland Kitchen Lack of Transportation (Non-Medical):   Physical Activity:   . Days of Exercise per Week:   . Minutes of Exercise per Session:   Stress:   . Feeling of Stress :   Social Connections:   . Frequency of Communication with Friends and Family:   . Frequency of Social Gatherings with Friends and Family:   . Attends Religious Services:   . Active  Member of Clubs or Organizations:   . Attends Banker Meetings:   Marland Kitchen Marital Status:   Intimate Partner Violence:   . Fear of Current or Ex-Partner:   . Emotionally Abused:   Marland Kitchen Physically Abused:   . Sexually Abused:     Review of Systems: See HPI, otherwise negative ROS  Physical Exam: BP (!) 147/91   Pulse 63   Temp 98.2 F (36.8 C) (Oral)   Resp 14   SpO2 100%  General:   Alert,  Well-developed, well-nourished, pleasant and cooperative in NAD Neck:  Supple; no masses or thyromegaly. No significant cervical adenopathy. Lungs:  Clear throughout to auscultation.   No wheezes, crackles, or rhonchi. No acute distress. Heart:  Regular rate and rhythm; no murmurs, clicks, rubs,  or gallops. Abdomen: Non-distended, normal bowel sounds.  Soft and nontender without appreciable mass or hepatosplenomegaly.  Pulses:  Normal pulses noted. Extremities:  Without clubbing or edema.  Impression/Plan: 54 year old gentleman presents for high risk screening colonoscopy.  Mother with advanced colonic polyps by report.  I have offered the patient a high rescreening colonoscopy today.  The risks, benefits, limitations, alternatives and imponderables have been reviewed with the patient. Questions have been answered. All parties are agreeable.      Notice: This dictation was prepared with Dragon dictation along with smaller phrase technology. Any transcriptional errors that result from this process are unintentional and may not be corrected upon review.

## 2019-08-02 NOTE — Discharge Instructions (Signed)
Colonoscopy Discharge Instructions  Read the instructions outlined below and refer to this sheet in the next few weeks. These discharge instructions provide you with general information on caring for yourself after you leave the hospital. Your doctor may also give you specific instructions. While your treatment has been planned according to the most current medical practices available, unavoidable complications occasionally occur. If you have any problems or questions after discharge, call Dr. Jena Gauss at [phone]. ACTIVITY  You may resume your regular activity, but move at a slower pace for the next 24 hours.   Take frequent rest periods for the next 24 hours.   Walking will help get rid of the air and reduce the bloated feeling in your belly (abdomen).   No driving for 24 hours (because of the medicine (anesthesia) used during the test).    Do not sign any important legal documents or operate any machinery for 24 hours (because of the anesthesia used during the test).  NUTRITION  Drink plenty of fluids.   You may resume your normal diet as instructed by your doctor.   Begin with a light meal and progress to your normal diet. Heavy or fried foods are harder to digest and may make you feel sick to your stomach (nauseated).   Avoid alcoholic beverages for 24 hours or as instructed.  MEDICATIONS  You may resume your normal medications unless your doctor tells you otherwise.  WHAT YOU CAN EXPECT TODAY  Some feelings of bloating in the abdomen.   Passage of more gas than usual.   Spotting of blood in your stool or on the toilet paper.  IF YOU HAD POLYPS REMOVED DURING THE COLONOSCOPY:  No aspirin products for 7 days or as instructed.   No alcohol for 7 days or as instructed.   Eat a soft diet for the next 24 hours.  FINDING OUT THE RESULTS OF YOUR TEST Not all test results are available during your visit. If your test results are not back during the visit, make an appointment  with your caregiver to find out the results. Do not assume everything is normal if you have not heard from your caregiver or the medical facility. It is important for you to follow up on all of your test results.  SEEK IMMEDIATE MEDICAL ATTENTION IF:  You have more than a spotting of blood in your stool.   Your belly is swollen (abdominal distention).   You are nauseated or vomiting.   You have a temperature over 101.   You have abdominal pain or discomfort that is severe or gets worse throughout the day.   Colon diverticulosis information provided  Recommend repeat colonoscopy in 5 years  At patient request, I called Eryk Beavers III son, [phone] -call immediately rolled to voicemail which stated "has not been set up"     Diverticulosis  Diverticulosis is a condition that develops when small pouches (diverticula) form in the wall of the large intestine (colon). The colon is where water is absorbed and stool (feces) is formed. The pouches form when the inside layer of the colon pushes through weak spots in the outer layers of the colon. You may have a few pouches or many of them. The pouches usually do not cause problems unless they become inflamed or infected. When this happens, the condition is called diverticulitis. What are the causes? The cause of this condition is not known. What increases the risk? The following factors may make you more likely to develop this condition:  Being older than age 7. Your risk for this condition increases with age. Diverticulosis is rare among people younger than age 12. By age 62, many people have it.  Eating a low-fiber diet.  Having frequent constipation.  Being overweight.  Not getting enough exercise.  Smoking.  Taking over-the-counter pain medicines, like aspirin and ibuprofen.  Having a family history of diverticulosis. What are the signs or symptoms? In most people, there are no symptoms of this condition. If you do have  symptoms, they may include:  Bloating.  Cramps in the abdomen.  Constipation or diarrhea.  Pain in the lower left side of the abdomen. How is this diagnosed? Because diverticulosis usually has no symptoms, it is most often diagnosed during an exam for other colon problems. The condition may be diagnosed by:  Using a flexible scope to examine the colon (colonoscopy).  Taking an X-ray of the colon after dye has been put into the colon (barium enema).  Having a CT scan. How is this treated? You may not need treatment for this condition. Your health care provider may recommend treatment to prevent problems. You may need treatment if you have symptoms or if you previously had diverticulitis. Treatment may include:  Eating a high-fiber diet.  Taking a fiber supplement.  Taking a live bacteria supplement (probiotic).  Taking medicine to relax your colon. Follow these instructions at home: Medicines  Take over-the-counter and prescription medicines only as told by your health care provider.  If told by your health care provider, take a fiber supplement or probiotic. Constipation prevention Your condition may cause constipation. To prevent or treat constipation, you may need to:  Drink enough fluid to keep your urine pale yellow.  Take over-the-counter or prescription medicines.  Eat foods that are high in fiber, such as beans, whole grains, and fresh fruits and vegetables.  Limit foods that are high in fat and processed sugars, such as fried or sweet foods.  General instructions  Try not to strain when you have a bowel movement.  Keep all follow-up visits as told by your health care provider. This is important. Contact a health care provider if you:  Have pain in your abdomen.  Have bloating.  Have cramps.  Have not had a bowel movement in 3 days. Get help right away if:  Your pain gets worse.  Your bloating becomes very bad.  You have a fever or chills, and  your symptoms suddenly get worse.  You vomit.  You have bowel movements that are bloody or black.  You have bleeding from your rectum. Summary  Diverticulosis is a condition that develops when small pouches (diverticula) form in the wall of the large intestine (colon).  You may have a few pouches or many of them.  This condition is most often diagnosed during an exam for other colon problems.  Treatment may include increasing the fiber in your diet, taking supplements, or taking medicines. This information is not intended to replace advice given to you by your health care provider. Make sure you discuss any questions you have with your health care provider. Document Revised: 09/24/2018 Document Reviewed: 09/24/2018 Elsevier Patient Education  [place] After These instructions provide you with information about caring for yourself after your procedure. Your health care provider may also give you more specific instructions. Your treatment has been planned according to current medical practices, but problems sometimes occur. Call your health care provider if you have any  problems or questions after your procedure. What can I expect after the procedure? After your procedure, you may:  Feel sleepy for several hours.  Feel clumsy and have poor balance for several hours.  Feel forgetful about what happened after the procedure.  Have poor judgment for several hours.  Feel nauseous or vomit.  Have a sore throat if you had a breathing tube during the procedure. Follow these instructions at home: For at least 24 hours after the procedure:      Have a responsible adult stay with you. It is important to have someone help care for you until you are awake and alert.  Rest as needed.  Do not: ? Participate in activities in which you could fall or become injured. ? Drive. ? Use heavy machinery. ? Drink alcohol. ? Take sleeping pills or  medicines that cause drowsiness. ? Make important decisions or sign legal documents. ? Take care of children on your own. Eating and drinking  Follow the diet that is recommended by your health care provider.  If you vomit, drink water, juice, or soup when you can drink without vomiting.  Make sure you have little or no nausea before eating solid foods. General instructions  Take over-the-counter and prescription medicines only as told by your health care provider.  If you have sleep apnea, surgery and certain medicines can increase your risk for breathing problems. Follow instructions from your health care provider about wearing your sleep device: ? Anytime you are sleeping, including during daytime naps. ? While taking prescription pain medicines, sleeping medicines, or medicines that make you drowsy.  If you smoke, do not smoke without supervision.  Keep all follow-up visits as told by your health care provider. This is important. Contact a health care provider if:  You keep feeling nauseous or you keep vomiting.  You feel light-headed.  You develop a rash.  You have a fever. Get help right away if:  You have trouble breathing. Summary  For several hours after your procedure, you may feel sleepy and have poor judgment.  Have a responsible adult stay with you for at least 24 hours or until you are awake and alert. This information is not intended to replace advice given to you by your health care provider. Make sure you discuss any questions you have with your health care provider. Document Revised: 05/26/2017 Document Reviewed: 06/18/2015 Elsevier Patient Education  2020 [employer].

## 2019-08-02 NOTE — Anesthesia Postprocedure Evaluation (Signed)
Anesthesia Post Note  Patient: Diallo J Guinea-Bissau Jr.  Procedure(s) Performed: COLONOSCOPY WITH PROPOFOL (N/A )  Patient location during evaluation: PACU Anesthesia Type: General Level of consciousness: awake and alert Pain management: pain level controlled Vital Signs Assessment: post-procedure vital signs reviewed and stable Respiratory status: spontaneous breathing Cardiovascular status: blood pressure returned to baseline and stable Postop Assessment: no apparent nausea or vomiting Anesthetic complications: no     Last Vitals:  Vitals:   08/02/19 1027 08/02/19 1030  BP: 107/65 110/68  Pulse: 72 71  Resp: 15 15  Temp: 36.4 C   SpO2: 96% 96%    Last Pain:  Vitals:   08/02/19 1027  TempSrc:   PainSc: Asleep                 Dawnelle Warman

## 2019-08-02 NOTE — Op Note (Addendum)
[facility] Patient Name: [name] [name] Procedure Date: 08/02/2019 9:39 AM MRN: [id number] Date of Birth: 1965/09/12 Attending MD: Gennette Pac , MD CSN: [id number] Age: 54 Admit Type: Outpatient Procedure:                Colonoscopy Indications:              Colon cancer screening in patient with 1st-degree                            relative having advanced adenoma of the colon Providers:                Gennette Pac, MD, Loma Messing B. Patsy Lager, RN,                            Dyann Ruddle Referring MD:              Medicines:                Propofol per Anesthesia Complications:            No immediate complications. Estimated Blood Loss:     Estimated blood loss: none. Procedure:                Pre-Anesthesia Assessment:                           - Prior to the procedure, a History and Physical                            was performed, and patient medications and                            allergies were reviewed. The patient's tolerance of                            previous anesthesia was also reviewed. The risks                            and benefits of the procedure and the sedation                            options and risks were discussed with the patient.                            All questions were answered, and informed consent                            was obtained. Prior Anticoagulants: The patient has                            taken no previous anticoagulant or antiplatelet                            agents. ASA Grade Assessment: II - A patient with  mild systemic disease. After reviewing the risks                            and benefits, the patient was deemed in                            satisfactory condition to undergo the procedure.                           After obtaining informed consent, the colonoscope                            was passed under direct vision. Throughout the                            procedure,  the patient's blood pressure, pulse, and                            oxygen saturations were monitored continuously. The                            CF-[id number] ([id number]) scope was introduced through                            the anus and advanced to the the cecum, identified                            by appendiceal orifice and ileocecal valve. The                            colonoscopy was performed without difficulty. The                            patient tolerated the procedure well. The quality                            of the bowel preparation was adequate. Scope In: 10:08:47 AM Scope Out: 10:22:34 AM Scope Withdrawal Time: 0 hours 10 minutes 47 seconds  Total Procedure Duration: 0 hours 13 minutes 47 seconds  Findings:      The perianal and digital rectal examinations were normal.      Scattered small and large-mouthed diverticula were found in the entire       colon.      The exam was otherwise without abnormality on direct and retroflexion       views. Impression:               - Diverticulosis in the entire examined colon.                           - The examination was otherwise normal on direct                            and retroflexion views.                           -  No specimens collected. Moderate Sedation:      Moderate (conscious) sedation was personally administered by an       anesthesia professional. The following parameters were monitored: oxygen       saturation, heart rate, blood pressure, respiratory rate, EKG, adequacy       of pulmonary ventilation, and response to care. Recommendation:           - Patient has a contact number available for                            emergencies. The signs and symptoms of potential                            delayed complications were discussed with the                            patient. Return to normal activities tomorrow.                            Written discharge instructions were provided to the                             patient.                           - Resume previous diet.                           - Continue present medications.                           - Repeat colonoscopy in 5 years for screening                            purposes.                           - Return to GI office (date not yet determined). Procedure Code(s):        --- Professional ---                           [phone], Colonoscopy, flexible; diagnostic, including                            collection of specimen(s) by brushing or washing,                            when performed (separate procedure) Diagnosis Code(s):        --- Professional ---                           Z83.71, Family history of colonic polyps                           K57.30, Diverticulosis of large intestine without  perforation or abscess without bleeding CPT copyright 2019 American Medical Association. All rights reserved. The codes documented in this report are preliminary and upon coder review may  be revised to meet current compliance requirements. Gerrit Friends. Demri Poulton, MD Gennette Pac, MD 08/02/2019 10:33:09 AM This report has been signed electronically. Number of Addenda: 0

## 2019-08-02 NOTE — Anesthesia Preprocedure Evaluation (Signed)
Anesthesia Evaluation  Patient identified by MRN, date of birth, ID band Patient awake    Reviewed: Allergy & Precautions, NPO status , Patient's Chart, lab work & pertinent test results  Airway Mallampati: II  TM Distance: >3 FB Neck ROM: Full    Dental  (+) Dental Advisory Given, Teeth Intact   Pulmonary sleep apnea and Continuous Positive Airway Pressure Ventilation ,    Pulmonary exam normal breath sounds clear to auscultation       Cardiovascular hypertension, Normal cardiovascular exam Rhythm:Regular Rate:Normal     Neuro/Psych    GI/Hepatic   Endo/Other    Renal/GU      Musculoskeletal  (+) Arthritis ,   Abdominal   Peds  Hematology   Anesthesia Other Findings   Reproductive/Obstetrics                             Anesthesia Physical Anesthesia Plan  ASA: II  Anesthesia Plan: General   Post-op Pain Management:    Induction:   PONV Risk Score and Plan: 2  Airway Management Planned: Nasal Cannula, Natural Airway and Simple Face Mask  Additional Equipment:   Intra-op Plan:   Post-operative Plan:   Informed Consent: I have reviewed the patients History and Physical, chart, labs and discussed the procedure including the risks, benefits and alternatives for the proposed anesthesia with the patient or authorized representative who has indicated his/her understanding and acceptance.     Dental advisory given  Plan Discussed with: CRNA and Surgeon  Anesthesia Plan Comments:         Anesthesia Quick Evaluation

## 2019-08-02 NOTE — Transfer of Care (Signed)
Immediate Anesthesia Transfer of Care Note  Patient: [name] J Guinea-Bissau Jr.  Procedure(s) Performed: COLONOSCOPY WITH PROPOFOL (N/A )  Patient Location: PACU  Anesthesia Type:General  Level of Consciousness: awake  Airway & Oxygen Therapy: Patient Spontanous Breathing  Post-op Assessment: Report given to RN  Post vital signs: Reviewed  Last Vitals:  Vitals Value Taken Time  BP 110/68 08/02/19 1030  Temp    Pulse 72 08/02/19 1031  Resp 15 08/02/19 1031  SpO2 96 % 08/02/19 1031  Vitals shown include unvalidated device data.  Last Pain:  Vitals:   08/02/19 1027  TempSrc:   PainSc: (P) Asleep         Complications: No apparent anesthesia complications

## 2019-08-03 ENCOUNTER — Other Ambulatory Visit: Payer: Self-pay | Admitting: Family Medicine

## 2019-09-02 ENCOUNTER — Other Ambulatory Visit: Payer: Self-pay | Admitting: Family Medicine

## 2019-09-02 NOTE — Telephone Encounter (Signed)
Needs to be seen for further refills

## 2019-09-21 ENCOUNTER — Other Ambulatory Visit: Payer: Self-pay | Admitting: Family Medicine

## 2019-11-05 ENCOUNTER — Other Ambulatory Visit: Payer: Self-pay | Admitting: Family Medicine

## 2019-11-18 ENCOUNTER — Ambulatory Visit (INDEPENDENT_AMBULATORY_CARE_PROVIDER_SITE_OTHER): Admitting: Family Medicine

## 2019-11-18 ENCOUNTER — Other Ambulatory Visit: Payer: Self-pay

## 2019-11-18 ENCOUNTER — Encounter: Payer: Self-pay | Admitting: Family Medicine

## 2019-11-18 VITALS — BP 153/93 | HR 59 | Temp 98.6°F | Ht 73.0 in | Wt 269.0 lb

## 2019-11-18 DIAGNOSIS — M545 Low back pain, unspecified: Secondary | ICD-10-CM

## 2019-11-18 DIAGNOSIS — M62830 Muscle spasm of back: Secondary | ICD-10-CM | POA: Diagnosis not present

## 2019-11-18 DIAGNOSIS — I10 Essential (primary) hypertension: Secondary | ICD-10-CM

## 2019-11-18 MED ORDER — TIZANIDINE HCL 4 MG PO TABS: 4.0000 mg | ORAL_TABLET | Freq: Four times a day (QID) | ORAL | 0 refills | Status: DC | PRN

## 2019-11-18 NOTE — Progress Notes (Signed)
Subjective: CC: back pain PCP: Mechele Claude, MD  [name]:[name] J Guinea-Bissau Jr. is a 54 y.o. male presenting to clinic today with his wife for:  1. Back pain Zyan reports right sided low back pain that started 5 days ago after helping his daughter move. No known trauma or injury. The pain is worse with movement, sometimes with shooting pains that radiate to his side. He is also having muscle spasms at times in his back. The pain has started to slowly get better over the last 5 days. He has been resting, using tiger balm, and heating pads with some relief. No changes in bowel or bladder control.   2. HTN He occasionally has his daughter check his BP at home and it is usually 130s/80s. He reports his BP is probably high today because of the pain in his back. He takes amlodipine and metoprolol as prescribed. He denies chest pain, shortness of breath, or edema. Denies visual disturbances.   Relevant past medical, surgical, family, and social history reviewed and updated as indicated.  Allergies and medications reviewed and updated.  Allergies  Allergen Reactions  . Sulfa Antibiotics     Unknown-was told that   Past Medical History:  Diagnosis Date  . Gout   . Hypertension   . Medical history non-contributory   . Wears glasses     Current Outpatient Medications:  .  allopurinol (ZYLOPRIM) 300 MG tablet, TAKE 1 TABLET AT BEDTIME (Patient taking differently: Take 300 mg by mouth at bedtime. ), Disp: 90 tablet, Rfl: 3 .  amLODipine (NORVASC) 2.5 MG tablet, TAKE 1 TABLET(2.5 MG) BY MOUTH DAILY, Disp: 90 tablet, Rfl: 0 .  Eszopiclone 3 MG TABS, TAKE 1 TABLET BY MOUTH EVERY NIGHT AT BEDTIME (Patient taking differently: Take 3 mg by mouth at bedtime. ), Disp: 90 tablet, Rfl: 1 .  metoprolol succinate (TOPROL-XL) 100 MG 24 hr tablet, TAKE 1 TABLET BY MOUTH EVERY DAY WITH OR IMMEDIATEKY FOLLOWING A MEAL, Disp: 90 tablet, Rfl: 0 .  nabumetone (RELAFEN) 500 MG tablet, Take 1 tablet (500 mg total) by  mouth 2 (two) times daily as needed. Needs to be seen before next refill, Disp: 120 tablet, Rfl: 0 .  acetaminophen (TYLENOL) 500 MG tablet, Take 500 mg by mouth every 6 (six) hours as needed for mild pain or headache.  (Patient not taking: Reported on 11/18/2019), Disp: , Rfl:  .  Vitamin D, Ergocalciferol, (DRISDOL) 1.25 MG (50000 UNIT) CAPS capsule, Take 1 capsule (50,000 Units total) by mouth every 7 (seven) days. (Patient not taking: Reported on 11/18/2019), Disp: 13 capsule, Rfl: 1 Social History   Socioeconomic History  . Marital status: Married    Spouse name: Not on file  . Number of children: Not on file  . Years of education: Not on file  . Highest education level: Not on file  Occupational History  . Not on file  Tobacco Use  . Smoking status: Never Smoker  . Smokeless tobacco: Never Used  Substance and Sexual Activity  . Alcohol use: Yes    Comment: rare  . Drug use: No  . Sexual activity: Not on file  Other Topics Concern  . Not on file  Social History Narrative  . Not on file   Social Determinants of Health   Financial Resource Strain:   . Difficulty of Paying Living Expenses: Not on file  Food Insecurity:   . Worried About Programme researcher, broadcasting/film/video in the Last Year: Not on file  .  Ran Out of Food in the Last Year: Not on file  Transportation Needs:   . Lack of Transportation (Medical): Not on file  . Lack of Transportation (Non-Medical): Not on file  Physical Activity:   . Days of Exercise per Week: Not on file  . Minutes of Exercise per Session: Not on file  Stress:   . Feeling of Stress : Not on file  Social Connections:   . Frequency of Communication with Friends and Family: Not on file  . Frequency of Social Gatherings with Friends and Family: Not on file  . Attends Religious Services: Not on file  . Active Member of Clubs or Organizations: Not on file  . Attends Banker Meetings: Not on file  . Marital Status: Not on file  Intimate Partner  Violence:   . Fear of Current or Ex-Partner: Not on file  . Emotionally Abused: Not on file  . Physically Abused: Not on file  . Sexually Abused: Not on file   Family History  Problem Relation Age of Onset  . Colon cancer Mother        Precancerous colon polyps; unsure of actual CRC?  . Diabetes Father   . Hypertension Father   . Heart attack Father     Review of Systems  Constitutional: Negative for chills and fever.  Eyes: Negative for visual disturbance.  Respiratory: Negative for shortness of breath.   Cardiovascular: Negative for chest pain and leg swelling.  Gastrointestinal: Negative for abdominal pain and vomiting.  Genitourinary: Negative for dysuria.  Musculoskeletal: Positive for back pain.  Neurological: Negative for dizziness.     Objective: Office vital signs reviewed. BP (!) 153/93   Pulse (!) 59   Temp 98.6 F (37 C) (Temporal)   Ht 6\' 1"  (1.854 m)   Wt 269 lb (122 kg)   BMI 35.49 kg/m     Physical Examination:  Physical Exam Vitals and nursing note reviewed.  Constitutional:      General: He is not in acute distress.    Appearance: Normal appearance. He is not ill-appearing or toxic-appearing.  HENT:     Head: Normocephalic.  Cardiovascular:     Rate and Rhythm: Normal rate and regular rhythm.     Pulses: Normal pulses.     Heart sounds: Normal heart sounds. No murmur heard.   Pulmonary:     Effort: Pulmonary effort is normal. No respiratory distress.     Breath sounds: Normal breath sounds.  Abdominal:     General: Bowel sounds are normal.     Palpations: Abdomen is soft.     Tenderness: There is no abdominal tenderness. There is no guarding.  Musculoskeletal:     Thoracic back: No deformity or bony tenderness.     Lumbar back: No deformity or bony tenderness.       Back:     Right lower leg: No edema.     Left lower leg: No edema.  Skin:    General: Skin is warm and dry.     Findings: No erythema or rash.  Neurological:      General: No focal deficit present.     Mental Status: He is alert and oriented to person, place, and time.     Motor: No weakness.  Psychiatric:        Mood and Affect: Mood normal.        Behavior: Behavior normal.      Results for orders placed or performed during the hospital  encounter of 07/30/19  SARS CORONAVIRUS 2 (TAT 6-24 HRS) Nasopharyngeal Nasopharyngeal Swab   Specimen: Nasopharyngeal Swab  Result Value Ref Range   SARS Coronavirus 2 NEGATIVE NEGATIVE     Assessment/ Plan: Lucy was seen today for back pain.  Diagnoses and all orders for this visit:  Acute right-sided low back pain without sciatica No concern for fracture today. Likely muscle strain. Discussed typical healing time. Can continue to take Relafen that he has at home, tiger balm, and heat/ice. Discussed moving as able. Zanaflex given for muscle spasms. Follow up if no improvement or worsening of symptoms.  -     tiZANidine (ZANAFLEX) 4 MG tablet; Take 1 tablet (4 mg total) by mouth every 6 (six) hours as needed for muscle spasms.  Muscle spasm of back -     tiZANidine (ZANAFLEX) 4 MG tablet; Take 1 tablet (4 mg total) by mouth every 6 (six) hours as needed for muscle spasms.  Essential hypertension No alarm signs today. Discussed that elevated BP today may be due to pain. Discussed checking BP at home. Instructed to make an appointment soon with PCP for HTN follow up and lab work.    The above assessment and management plan was discussed with the patient. The patient verbalized understanding of and has agreed to the management plan. Patient is aware to call the clinic if symptoms persist or worsen. Patient is aware when to return to the clinic for a follow-up visit. Patient educated on when it is appropriate to go to the emergency department.   Harlow Mares, FNP-C [facility] [address] [place], [place] [address] [phone]

## 2019-11-18 NOTE — Patient Instructions (Signed)
Acute Back Pain, Adult Acute back pain is sudden and usually short-lived. It is often caused by an injury to the muscles and tissues in the back. The injury may result from:  A muscle or ligament getting overstretched or torn (strained). Ligaments are tissues that connect bones to each other. Lifting something improperly can cause a back strain.  Wear and tear (degeneration) of the spinal disks. Spinal disks are circular tissue that provides cushioning between the bones of the spine (vertebrae).  Twisting motions, such as while playing sports or doing yard work.  A hit to the back.  Arthritis. You may have a physical exam, lab tests, and imaging tests to find the cause of your pain. Acute back pain usually goes away with rest and home care. Follow these instructions at home: Managing pain, stiffness, and swelling  Take over-the-counter and prescription medicines only as told by your health care provider.  Your health care provider may recommend applying ice during the first 24-48 hours after your pain starts. To do this: ? Put ice in a plastic bag. ? Place a towel between your skin and the bag. ? Leave the ice on for 20 minutes, 2-3 times a day.  If directed, apply heat to the affected area as often as told by your health care provider. Use the heat source that your health care provider recommends, such as a moist heat pack or a heating pad. ? Place a towel between your skin and the heat source. ? Leave the heat on for 20-30 minutes. ? Remove the heat if your skin turns bright red. This is especially important if you are unable to feel pain, heat, or cold. You have a greater risk of getting burned. Activity   Do not stay in bed. Staying in bed for more than 1-2 days can delay your recovery.  Sit up and stand up straight. Avoid leaning forward when you sit, or hunching over when you stand. ? If you work at a desk, sit close to it so you do not need to lean over. Keep your chin tucked  in. Keep your neck drawn back, and keep your elbows bent at a right angle. Your arms should look like the letter "L." ? Sit high and close to the steering wheel when you drive. Add lower back (lumbar) support to your car seat, if needed.  Take short walks on even surfaces as soon as you are able. Try to increase the length of time you walk each day.  Do not sit, drive, or stand in one place for more than 30 minutes at a time. Sitting or standing for long periods of time can put stress on your back.  Do not drive or use heavy machinery while taking prescription pain medicine.  Use proper lifting techniques. When you bend and lift, use positions that put less stress on your back: ? Bend your knees. ? Keep the load close to your body. ? Avoid twisting.  Exercise regularly as told by your health care provider. Exercising helps your back heal faster and helps prevent back injuries by keeping muscles strong and flexible.  Work with a physical therapist to make a safe exercise program, as recommended by your health care provider. Do any exercises as told by your physical therapist. Lifestyle  Maintain a healthy weight. Extra weight puts stress on your back and makes it difficult to have good posture.  Avoid activities or situations that make you feel anxious or stressed. Stress and anxiety increase muscle   tension and can make back pain worse. Learn ways to manage anxiety and stress, such as through exercise. General instructions  Sleep on a firm mattress in a comfortable position. Try lying on your side with your knees slightly bent. If you lie on your back, put a pillow under your knees.  Follow your treatment plan as told by your health care provider. This may include: ? Cognitive or behavioral therapy. ? Acupuncture or massage therapy. ? Meditation or yoga. Contact a health care provider if:  You have pain that is not relieved with rest or medicine.  You have increasing pain going down  into your legs or buttocks.  Your pain does not improve after 2 weeks.  You have pain at night.  You lose weight without trying.  You have a fever or chills. Get help right away if:  You develop new bowel or bladder control problems.  You have unusual weakness or numbness in your arms or legs.  You develop nausea or vomiting.  You develop abdominal pain.  You feel faint. Summary  Acute back pain is sudden and usually short-lived.  Use proper lifting techniques. When you bend and lift, use positions that put less stress on your back.  Take over-the-counter and prescription medicines and apply heat or ice as directed by your health care provider. This information is not intended to replace advice given to you by your health care provider. Make sure you discuss any questions you have with your health care provider. Document Revised: 06/16/2018 Document Reviewed: 10/09/2016 Elsevier Patient Education  2020 [employer]  

## 2019-11-24 ENCOUNTER — Encounter (HOSPITAL_COMMUNITY): Payer: Self-pay | Admitting: Physical Therapy

## 2019-12-01 ENCOUNTER — Other Ambulatory Visit: Payer: Self-pay | Admitting: Family Medicine

## 2019-12-01 DIAGNOSIS — M62830 Muscle spasm of back: Secondary | ICD-10-CM

## 2019-12-01 DIAGNOSIS — M545 Low back pain, unspecified: Secondary | ICD-10-CM

## 2019-12-04 ENCOUNTER — Other Ambulatory Visit: Payer: Self-pay | Admitting: Family Medicine

## 2019-12-06 IMAGING — DX DG ELBOW 2V*R*
2 series · 2 of 2 positions shown · non-contrast
Comparison: None.

CLINICAL DATA: Hit elbow on door frame several weeks prior

EXAM:
RIGHT ELBOW - 2 VIEW

[elbow ap]
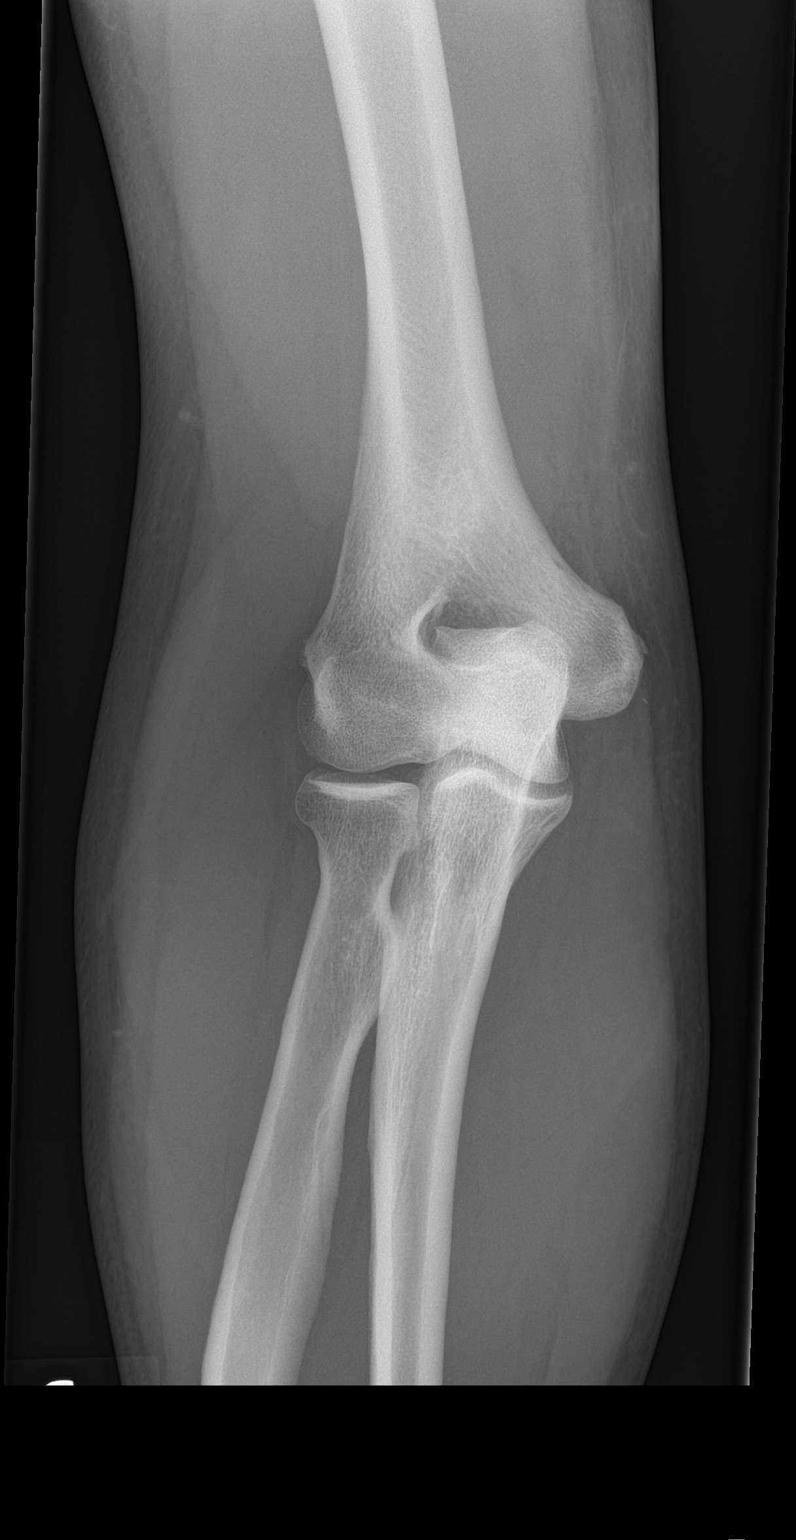

[elbow lat]
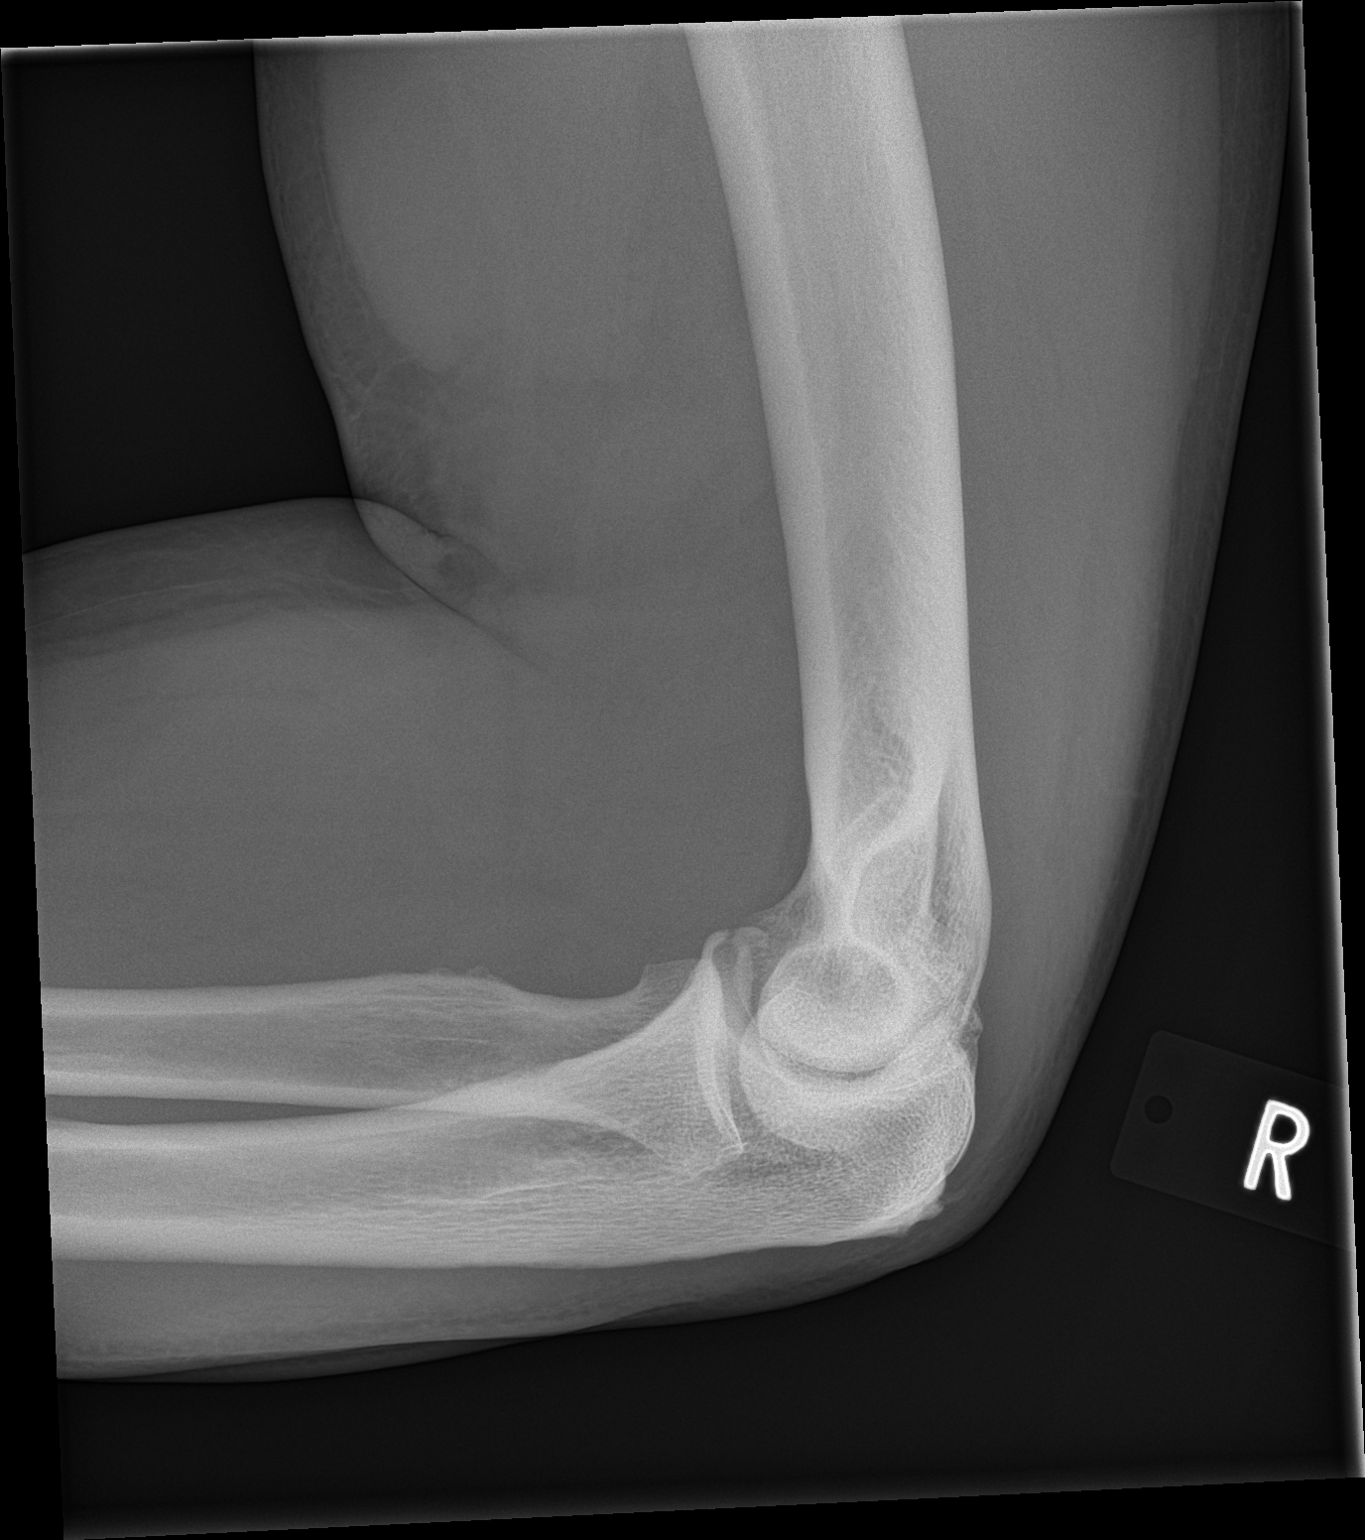

[2 of 2 positions shown; findings below may reference images not displayed]

FINDINGS: Frontal and lateral views were obtained. There is no evident
fracture or dislocation. No joint effusion. There is spurring along
the coracoid process of the proximal ulna. There is a small
olecranon process spur as well. There is no appreciable joint space
narrowing or erosion.
IMPRESSION: Spurring along the proximal olecranon. No appreciable joint space
narrowing or erosion. No evident fracture or dislocation.

## 2019-12-07 ENCOUNTER — Other Ambulatory Visit: Payer: Self-pay

## 2019-12-07 ENCOUNTER — Encounter: Payer: Self-pay | Admitting: Family Medicine

## 2019-12-07 ENCOUNTER — Ambulatory Visit (INDEPENDENT_AMBULATORY_CARE_PROVIDER_SITE_OTHER): Admitting: Family Medicine

## 2019-12-07 VITALS — BP 151/94 | HR 65 | Temp 97.3°F | Resp 20 | Ht 73.0 in | Wt 269.0 lb

## 2019-12-07 DIAGNOSIS — M62830 Muscle spasm of back: Secondary | ICD-10-CM | POA: Diagnosis not present

## 2019-12-07 DIAGNOSIS — Z23 Encounter for immunization: Secondary | ICD-10-CM

## 2019-12-07 DIAGNOSIS — I10 Essential (primary) hypertension: Secondary | ICD-10-CM | POA: Diagnosis not present

## 2019-12-07 NOTE — Progress Notes (Signed)
Subjective:  Patient ID: [name] J Guinea-Bissau Jr., male    DOB: Aug 30, 1965  Age: 54 y.o. MRN: [id number]  CC: 3 week follow up   HPI [name] J Guinea-Bissau Jr. presents for although his back is much better.  He still gets sciatic nerve pain from the left buttocks to the knee periodically he describes this as a tingling that goes to his toes sometimes.  He does have occasional swelling in the feet and ankles.  His blood pressure is elevated again he says that he will get a monitor and start checking it at home.  Depression screen [facility] 2/9 12/07/2019 11/18/2019 03/18/2019  Decreased Interest 0 0 0  Down, Depressed, Hopeless 0 0 0  PHQ - 2 Score 0 0 0    History Tyron has a past medical history of Gout, Hypertension, Medical history non-contributory, and Wears glasses.   He has a past surgical history that includes Wisdom tooth extraction; Knee arthroscopy with patellar tendon repair (Left, 09/03/2012); and Colonoscopy with propofol (N/A, 08/02/2019).   His family history includes Colon cancer in his mother; Diabetes in his father; Heart attack in his father; Hypertension in his father.He reports that he has never smoked. He has never used smokeless tobacco. He reports current alcohol use. He reports that he does not use drugs.    ROS Review of Systems  Constitutional: Negative for fever.  Respiratory: Negative for shortness of breath.   Cardiovascular: Positive for leg swelling. Negative for chest pain and palpitations.  Musculoskeletal: Positive for arthralgias, back pain and myalgias.  Skin: Negative for rash.  Neurological: Positive for numbness (Left sciatic distribution).    Objective:  BP (!) 151/94   Pulse 65   Temp (!) 97.3 F (36.3 C) (Temporal)   Resp 20   Ht 6\' 1"  (1.854 m)   Wt 269 lb (122 kg)   SpO2 98%   BMI 35.49 kg/m   BP Readings from Last 3 Encounters:  12/07/19 (!) 151/94  11/18/19 (!) 153/93  08/02/19 (!) 135/99    Wt Readings from Last 3 Encounters:  12/07/19 269 lb  (122 kg)  11/18/19 269 lb (122 kg)  06/17/19 255 lb (115.7 kg)     Physical Exam Vitals reviewed.  Constitutional:      Appearance: He is well-developed.  HENT:     Head: Normocephalic and atraumatic.     Right Ear: Tympanic membrane and external ear normal. No decreased hearing noted.     Left Ear: Tympanic membrane and external ear normal. No decreased hearing noted.     Mouth/Throat:     Pharynx: No oropharyngeal exudate or posterior oropharyngeal erythema.  Eyes:     Pupils: Pupils are equal, round, and reactive to light.  Cardiovascular:     Rate and Rhythm: Normal rate and regular rhythm.     Heart sounds: No murmur heard.   Pulmonary:     Effort: No respiratory distress.     Breath sounds: Normal breath sounds.  Abdominal:     General: Bowel sounds are normal.     Palpations: Abdomen is soft. There is no mass.     Tenderness: There is no abdominal tenderness.  Musculoskeletal:     Cervical back: Normal range of motion and neck supple.       Assessment & Plan:   Leveon was seen today for 3 week follow up.  Diagnoses and all orders for this visit:  Essential hypertension  Need for immunization against influenza -  Flu Vaccine QUAD 36+ mos IM  Muscle spasm of back       I am having [name] maintain his allopurinol, Vitamin D (Ergocalciferol), Eszopiclone, acetaminophen, nabumetone, tiZANidine, amLODipine, and metoprolol succinate.  Allergies as of 12/07/2019      Reactions   Sulfa Antibiotics    Unknown-was told that      Medication List       Accurate as of December 07, 2019  9:44 PM. If you have any questions, ask your nurse or doctor.        acetaminophen 500 MG tablet Commonly known as: TYLENOL Take 500 mg by mouth every 6 (six) hours as needed for mild pain or headache.   allopurinol 300 MG tablet Commonly known as: ZYLOPRIM TAKE 1 TABLET AT BEDTIME   amLODipine 2.5 MG tablet Commonly known as: NORVASC TAKE 1  TABLET(2.5 MG) BY MOUTH DAILY   Eszopiclone 3 MG Tabs TAKE 1 TABLET BY MOUTH EVERY NIGHT AT BEDTIME   metoprolol succinate 100 MG 24 hr tablet Commonly known as: TOPROL-XL TAKE 1 TABLET BY MOUTH EVERY DAY WITH OR IMMEDIATEKY FOLLOWING A MEAL   nabumetone 500 MG tablet Commonly known as: RELAFEN Take 1 tablet (500 mg total) by mouth 2 (two) times daily as needed. Needs to be seen before next refill   tiZANidine 4 MG tablet Commonly known as: ZANAFLEX TAKE 1 TABLET(4 MG) BY MOUTH EVERY 6 HOURS AS NEEDED FOR MUSCLE SPASMS   Vitamin D (Ergocalciferol) 1.25 MG (50000 UNIT) Caps capsule Commonly known as: DRISDOL Take 1 capsule (50,000 Units total) by mouth every 7 (seven) days.        Follow-up: Return in about 6 months (around 06/05/2020), or if symptoms worsen or fail to improve.  [name] [name], M.D.

## 2020-01-12 ENCOUNTER — Other Ambulatory Visit: Payer: Self-pay | Admitting: Family Medicine

## 2020-03-04 ENCOUNTER — Other Ambulatory Visit: Payer: Self-pay | Admitting: Family Medicine

## 2020-03-13 ENCOUNTER — Encounter: Admitting: Family Medicine

## 2020-03-28 ENCOUNTER — Encounter: Admitting: Family Medicine

## 2020-03-29 ENCOUNTER — Other Ambulatory Visit: Payer: Self-pay

## 2020-03-29 ENCOUNTER — Encounter: Payer: Self-pay | Admitting: Family Medicine

## 2020-03-29 ENCOUNTER — Ambulatory Visit (INDEPENDENT_AMBULATORY_CARE_PROVIDER_SITE_OTHER): Admitting: Family Medicine

## 2020-03-29 VITALS — BP 159/95 | HR 80 | Temp 97.0°F | Resp 20 | Ht 73.0 in | Wt 272.1 lb

## 2020-03-29 DIAGNOSIS — G4733 Obstructive sleep apnea (adult) (pediatric): Secondary | ICD-10-CM | POA: Diagnosis not present

## 2020-03-29 DIAGNOSIS — R002 Palpitations: Secondary | ICD-10-CM

## 2020-03-29 DIAGNOSIS — M5432 Sciatica, left side: Secondary | ICD-10-CM

## 2020-03-29 DIAGNOSIS — Z23 Encounter for immunization: Secondary | ICD-10-CM

## 2020-03-29 DIAGNOSIS — Z0000 Encounter for general adult medical examination without abnormal findings: Secondary | ICD-10-CM

## 2020-03-29 DIAGNOSIS — Z0001 Encounter for general adult medical examination with abnormal findings: Secondary | ICD-10-CM

## 2020-03-29 DIAGNOSIS — M109 Gout, unspecified: Secondary | ICD-10-CM | POA: Diagnosis not present

## 2020-03-29 DIAGNOSIS — I10 Essential (primary) hypertension: Secondary | ICD-10-CM | POA: Diagnosis not present

## 2020-03-29 DIAGNOSIS — Z125 Encounter for screening for malignant neoplasm of prostate: Secondary | ICD-10-CM

## 2020-03-29 DIAGNOSIS — F5101 Primary insomnia: Secondary | ICD-10-CM

## 2020-03-29 MED ORDER — NABUMETONE 500 MG PO TABS: ORAL_TABLET | ORAL | 5 refills | Status: DC

## 2020-03-29 MED ORDER — ESZOPICLONE 3 MG PO TABS: 3.0000 mg | ORAL_TABLET | Freq: Every day | ORAL | 1 refills | Status: DC

## 2020-03-29 MED ORDER — ALLOPURINOL 300 MG PO TABS: 300.0000 mg | ORAL_TABLET | Freq: Every day | ORAL | 3 refills | Status: DC

## 2020-03-29 MED ORDER — METOPROLOL SUCCINATE ER 100 MG PO TB24: ORAL_TABLET | ORAL | 0 refills | Status: DC

## 2020-03-29 MED ORDER — TRIAMTERENE-HCTZ 37.5-25 MG PO TABS
1.0000 | ORAL_TABLET | Freq: Every day | ORAL | 3 refills | Status: DC
Start: 2020-03-29 — End: 2020-10-05

## 2020-03-29 MED ORDER — AMLODIPINE BESYLATE 2.5 MG PO TABS: ORAL_TABLET | ORAL | 0 refills | Status: DC

## 2020-03-29 NOTE — Progress Notes (Addendum)
   [id number]  Name:  Daivon J Guinea-Bissau Jr.    MRN: [id number] DOB: [birth year]  03/29/2020  Mr. Guinea-Bissau was observed post Covid-19 immunization for 15 minutes without incident. He was provided with Vaccine Information Sheet and instruction to access the V-Safe system.   Mr. Guinea-Bissau was instructed to call 911 with any severe reactions post vaccine: Marland Kitchen Difficulty breathing  . Swelling of face and throat  . A fast heartbeat  . A bad rash all over body  . Dizziness and weakness

## 2020-03-29 NOTE — Progress Notes (Signed)
Subjective:  Patient ID: [name] J Iran Jr., male    DOB: [birth year]  Age: 55 y.o. MRN: [id number]  CC: Annual Exam   HPI [name] J Iran Jr. presents for Annual physical examination.  He is concerned that there is some swelling in his legs.  There is no pain or numbness.  He points to the inside margin of the shin on the left as being uncomfortable intermittently.  He does have occasional heartburn that is relieved by taking a tablespoon of vinegar.  He denies it causing any further symptoms and says that it is working well for him.  He reports joint pain intermittently various joints primarily the knees.  Main concern is sciatica running from the left buttock down to the posterior left thigh and ends behind the left knee.  He has some rather hard heartbeats at times.  No association with shortness of breath or chest pain.   Depression screen [facility] 2/9 03/29/2020 12/07/2019 11/18/2019  Decreased Interest 0 0 0  Down, Depressed, Hopeless 0 0 0  PHQ - 2 Score 0 0 0    History [name] [name] has a past medical history of Gout, Hypertension, Medical history non-contributory, and Wears glasses.   He has a past surgical history that includes Wisdom tooth extraction; Knee arthroscopy with patellar tendon repair (Left, 09/03/2012); and Colonoscopy with propofol (N/A, 08/02/2019).   His family history includes Colon cancer in his mother; Diabetes in his father; Heart attack in his father; Hypertension in his father.He reports that he has never smoked. He has never used smokeless tobacco. He reports current alcohol use. He reports that he does not use drugs.    ROS Review of Systems  Constitutional: Negative for activity change, fatigue and unexpected weight change.  HENT: Negative for congestion, ear pain, hearing loss, postnasal drip and trouble swallowing.   Eyes: Negative for pain and visual disturbance.  Respiratory: Negative for cough, chest tightness and shortness of breath.   Cardiovascular: Positive for  palpitations and leg swelling. Negative for chest pain.  Gastrointestinal: Negative for abdominal distention, abdominal pain, blood in stool, constipation, diarrhea, nausea and vomiting.  Endocrine: Negative for cold intolerance and polydipsia.  Genitourinary: Negative for difficulty urinating, dysuria, flank pain, frequency and urgency.  Musculoskeletal: Positive for arthralgias and myalgias. Negative for joint swelling.  Skin: Negative for color change, rash and wound.  Neurological: Positive for headaches. Negative for dizziness, syncope, speech difficulty, weakness, light-headedness and numbness.  Hematological: Does not bruise/bleed easily.  Psychiatric/Behavioral: Negative for confusion, decreased concentration, dysphoric mood and sleep disturbance. The patient is not nervous/anxious.     Objective:  BP (!) 159/95   Pulse 80   Temp (!) 97 F (36.1 C) (Temporal)   Resp 20   Ht 6' 1" (1.854 m)   Wt 272 lb 2 oz (123.4 kg)   SpO2 97%   BMI 35.90 kg/m   BP Readings from Last 3 Encounters:  03/29/20 (!) 159/95  12/07/19 (!) 151/94  11/18/19 (!) 153/93    Wt Readings from Last 3 Encounters:  03/29/20 272 lb 2 oz (123.4 kg)  12/07/19 269 lb (122 kg)  11/18/19 269 lb (122 kg)     Physical Exam Constitutional:      Appearance: He is well-developed and well-nourished.  HENT:     Head: Normocephalic and atraumatic.     Mouth/Throat:     Mouth: Oropharynx is clear and moist.  Eyes:     Extraocular Movements: EOM normal.     Pupils:  Pupils are equal, round, and reactive to light.  Neck:     Thyroid: No thyromegaly.     Trachea: No tracheal deviation.  Cardiovascular:     Rate and Rhythm: Normal rate and regular rhythm.     Heart sounds: Normal heart sounds. No murmur heard. No friction rub. No gallop.   Pulmonary:     Breath sounds: Normal breath sounds. No wheezing or rales.  Abdominal:     General: Bowel sounds are normal. There is no distension.     Palpations:  Abdomen is soft. There is no mass.     Tenderness: There is no abdominal tenderness.     Hernia: There is no hernia in the right inguinal area or left inguinal area.  Genitourinary:    Penis: Normal.      Testes: Normal.  Musculoskeletal:        General: No edema. Normal range of motion.     Cervical back: Normal range of motion.  Lymphadenopathy:     Cervical: No cervical adenopathy.  Skin:    General: Skin is warm and dry.  Neurological:     Mental Status: He is alert and oriented to person, place, and time.  Psychiatric:        Mood and Affect: Mood and affect normal.    EKG shows normal sinus rhythm no ischemic changes.  Normal axes.  No blocks.   Assessment & Plan:   Faaris was seen today for annual exam.  Diagnoses and all orders for this visit:  Well adult exam -     CBC with Differential/Platelet -     CMP14+EGFR -     Lipid panel -     PSA, total and free -     Uric acid  Essential hypertension -     CBC with Differential/Platelet -     CMP14+EGFR -     Lipid panel -     PSA, total and free -     Uric acid  Gout of foot, unspecified cause, unspecified chronicity, unspecified laterality -     CBC with Differential/Platelet -     CMP14+EGFR -     Uric acid -     allopurinol (ZYLOPRIM) 300 MG tablet; Take 1 tablet (300 mg total) by mouth at bedtime.  Obstructive sleep apnea -     CBC with Differential/Platelet -     CMP14+EGFR  Primary insomnia -     CBC with Differential/Platelet -     CMP14+EGFR -     Eszopiclone 3 MG TABS; Take 1 tablet (3 mg total) by mouth at bedtime. Take immediately before bedtime  Screening for prostate cancer -     PSA, total and free  Palpitation -     EKG 12-Lead  Sciatica of left side -     Ambulatory referral to Neurology  Other orders -     metoprolol succinate (TOPROL-XL) 100 MG 24 hr tablet; TAKE 1 TABLET BY MOUTH EVERY DAY WITH OR IMMEDIATEKY FOLLOWING A MEAL -     Discontinue: amLODipine (NORVASC) 2.5 MG  tablet; TAKE 1 TABLET(2.5 MG) BY MOUTH DAILY -     triamterene-hydrochlorothiazide (MAXZIDE-25) 37.5-25 MG tablet; Take 1 tablet by mouth daily. -     nabumetone (RELAFEN) 500 MG tablet; TAKE 1 TABLET(500 MG) BY MOUTH TWICE DAILY AS NEEDED       I have discontinued Malakai J. Iran Jr.'s Vitamin D (Ergocalciferol), tiZANidine, amLODipine, and amLODipine. I have also changed his Eszopiclone and  allopurinol. Additionally, I am having him start on triamterene-hydrochlorothiazide. Lastly, I am having him maintain his acetaminophen, metoprolol succinate, and nabumetone.  Allergies as of 03/29/2020      Reactions   Sulfa Antibiotics    Unknown-was told that      Medication List       Accurate as of March 29, 2020  3:32 PM. If you have any questions, ask your nurse or doctor.        STOP taking these medications   amLODipine 2.5 MG tablet Commonly known as: NORVASC Stopped by: Claretta Fraise, MD   tiZANidine 4 MG tablet Commonly known as: ZANAFLEX Stopped by: Claretta Fraise, MD   Vitamin D (Ergocalciferol) 1.25 MG (50000 UNIT) Caps capsule Commonly known as: DRISDOL Stopped by: Claretta Fraise, MD     TAKE these medications   acetaminophen 500 MG tablet Commonly known as: TYLENOL Take 500 mg by mouth every 6 (six) hours as needed for mild pain or headache.   allopurinol 300 MG tablet Commonly known as: ZYLOPRIM Take 1 tablet (300 mg total) by mouth at bedtime.   Eszopiclone 3 MG Tabs Take 1 tablet (3 mg total) by mouth at bedtime. Take immediately before bedtime What changed: additional instructions Changed by: Claretta Fraise, MD   metoprolol succinate 100 MG 24 hr tablet Commonly known as: TOPROL-XL TAKE 1 TABLET BY MOUTH EVERY DAY WITH OR IMMEDIATEKY FOLLOWING A MEAL   nabumetone 500 MG tablet Commonly known as: RELAFEN TAKE 1 TABLET(500 MG) BY MOUTH TWICE DAILY AS NEEDED   triamterene-hydrochlorothiazide 37.5-25 MG tablet Commonly known as: MAXZIDE-25 Take 1 tablet  by mouth daily. Started by: Claretta Fraise, MD        Follow-up: Return in about 6 months (around 09/26/2020).  Claretta Fraise, M.D.

## 2020-03-30 LAB — CBC WITH DIFFERENTIAL/PLATELET
Basophils Absolute: 0 10*3/uL (ref 0.0–0.2)
Basos: 0 %
EOS (ABSOLUTE): 0 10*3/uL (ref 0.0–0.4)
Eos: 0 %
Hematocrit: 39.9 % (ref 37.5–51.0)
Hemoglobin: 13.8 g/dL (ref 13.0–17.7)
Immature Grans (Abs): 0 10*3/uL (ref 0.0–0.1)
Immature Granulocytes: 0 %
Lymphocytes Absolute: 1.5 10*3/uL (ref 0.7–3.1)
Lymphs: 19 %
MCH: 29.7 pg (ref 26.6–33.0)
MCHC: 34.6 g/dL (ref 31.5–35.7)
MCV: 86 fL (ref 79–97)
Monocytes Absolute: 0.8 10*3/uL (ref 0.1–0.9)
Monocytes: 10 %
Neutrophils Absolute: 5.4 10*3/uL (ref 1.4–7.0)
Neutrophils: 71 %
Platelets: 228 10*3/uL (ref 150–450)
RBC: 4.64 x10E6/uL (ref 4.14–5.80)
RDW: 13.2 % (ref 11.6–15.4)
WBC: 7.7 10*3/uL (ref 3.4–10.8)

## 2020-03-30 LAB — CMP14+EGFR
ALT: 22 IU/L (ref 0–44)
AST: 22 IU/L (ref 0–40)
Albumin/Globulin Ratio: 1.8 (ref 1.2–2.2)
Albumin: 4.7 g/dL (ref 3.8–4.9)
Alkaline Phosphatase: 75 IU/L (ref 44–121)
BUN/Creatinine Ratio: 11 (ref 9–20)
BUN: 16 mg/dL (ref 6–24)
Bilirubin Total: 0.5 mg/dL (ref 0.0–1.2)
CO2: 23 mmol/L (ref 20–29)
Calcium: 9.6 mg/dL (ref 8.7–10.2)
Chloride: 104 mmol/L (ref 96–106)
Creatinine, Ser: 1.44 mg/dL — ABNORMAL HIGH (ref 0.76–1.27)
GFR calc Af Amer: 62 mL/min/{1.73_m2} (ref >59)
GFR calc non Af Amer: 54 mL/min/{1.73_m2} — ABNORMAL LOW (ref >59)
Globulin, Total: 2.6 g/dL (ref 1.5–4.5)
Glucose: 88 mg/dL (ref 65–99)
Potassium: 4.3 mmol/L (ref 3.5–5.2)
Sodium: 142 mmol/L (ref 134–144)
Total Protein: 7.3 g/dL (ref 6.0–8.5)

## 2020-03-30 LAB — LIPID PANEL
Chol/HDL Ratio: 4.3 ratio (ref 0.0–5.0)
Cholesterol, Total: 178 mg/dL (ref 100–199)
HDL: 41 mg/dL (ref >39)
LDL Chol Calc (NIH): 117 mg/dL — ABNORMAL HIGH (ref 0–99)
Triglycerides: 108 mg/dL (ref 0–149)
VLDL Cholesterol Cal: 20 mg/dL (ref 5–40)

## 2020-03-30 LAB — PSA, TOTAL AND FREE
PSA, Free Pct: 32.2 %
PSA, Free: 0.29 ng/mL
Prostate Specific Ag, Serum: 0.9 ng/mL (ref 0.0–4.0)

## 2020-03-30 LAB — URIC ACID: Uric Acid: 6.8 mg/dL (ref 3.8–8.4)

## 2020-03-30 NOTE — Progress Notes (Signed)
Hello Draylen,  Your lab result is normal and/or stable.Some minor variations that are not significant are commonly marked abnormal, but do not represent any medical problem for you.  Best regards, Mechele Claude, M.D.

## 2020-04-10 ENCOUNTER — Other Ambulatory Visit: Payer: Self-pay | Admitting: Family Medicine

## 2020-04-10 DIAGNOSIS — M5136 Other intervertebral disc degeneration, lumbar region: Secondary | ICD-10-CM

## 2020-04-11 ENCOUNTER — Other Ambulatory Visit: Payer: Self-pay | Admitting: Family Medicine

## 2020-04-11 DIAGNOSIS — M5136 Other intervertebral disc degeneration, lumbar region: Secondary | ICD-10-CM

## 2020-04-13 ENCOUNTER — Encounter: Payer: Self-pay | Admitting: Orthopaedic Surgery

## 2020-04-13 ENCOUNTER — Other Ambulatory Visit: Payer: Self-pay

## 2020-04-13 ENCOUNTER — Ambulatory Visit (INDEPENDENT_AMBULATORY_CARE_PROVIDER_SITE_OTHER): Payer: Self-pay | Admitting: Orthopaedic Surgery

## 2020-04-13 VITALS — Ht 73.0 in | Wt 272.0 lb

## 2020-04-13 DIAGNOSIS — M5136 Other intervertebral disc degeneration, lumbar region: Secondary | ICD-10-CM

## 2020-04-13 DIAGNOSIS — M5126 Other intervertebral disc displacement, lumbar region: Secondary | ICD-10-CM

## 2020-04-13 NOTE — Progress Notes (Signed)
Office Visit Note   Patient: [name] J Guinea-Bissau Jr.           Date of Birth: 1966-01-11           MRN: [id number] Visit Date: 04/13/2020              Requested by: Mechele Claude, MD [address] [place],  [place] [address] PCP: Mechele Claude, MD   Assessment & Plan: Visit Diagnoses:  1. Other intervertebral disc degeneration, lumbar region   2. Protrusion of lumbar intervertebral disc     Plan: Patient is now been symptomatic for a year.  He has had gradual increase symptoms.  We will set him up for single epidural injection on the left of the L3-4 level see if he gets some relief.  Patient states this is progressed and if the injection is not effective he is ready to consider operative intervention.  Follow-up after injection in 8 weeks.  Today's visit we reviewed previous MRI scan again discussed pathophysiology and discussed outlined range of treatment options.  Follow-up post epidural injection. Follow-Up Instructions: No follow-ups on file.   Orders:  Orders Placed This Encounter  Procedures  . Ambulatory referral to Physical Medicine Rehab   No orders of the defined types were placed in this encounter.     Procedures: No procedures performed   Clinical Data: No additional findings.   Subjective: Chief Complaint  Patient presents with  . Lower Back - Pain    HPI 55 year old male returns with ongoing problems with back pain and left leg pain that radiates into his left thigh.  His pain tends to come and go at times is moderate to severe.  Recently it has been not worse.  Previous MRI showed far lateral left disc protrusion at L3-4 level.  He had some foraminal extrusion on the right at L5-S1 but no right leg symptoms.  He has had some problems with stairs.  When he leans forward turns or twists he has had increased pain has to modify many activities.  Pain bothers him at night and he cannot be in a prone position due to increased back and left leg pain.  Review of  Systems 14 point system update unchanged from 03/25/2019 office visit.  He does have some cervical foraminal stenosis C5-6 with disc degeneration and persistent back and left leg pain with L3-4 left disc foraminal extrusion.   Objective: Vital Signs: Ht 6\' 1"  (1.854 m)   Wt 272 lb (123.4 kg)   BMI 35.89 kg/m   Physical Exam Constitutional:      Appearance: He is well-developed and well-nourished.  HENT:     Head: Normocephalic and atraumatic.  Eyes:     Extraocular Movements: EOM normal.     Pupils: Pupils are equal, round, and reactive to light.  Neck:     Thyroid: No thyromegaly.     Trachea: No tracheal deviation.  Cardiovascular:     Rate and Rhythm: Normal rate.  Pulmonary:     Effort: Pulmonary effort is normal.     Breath sounds: No wheezing.  Abdominal:     General: Bowel sounds are normal.     Palpations: Abdomen is soft.  Skin:    General: Skin is warm and dry.     Capillary Refill: Capillary refill takes less than 2 seconds.  Neurological:     Mental Status: He is alert and oriented to person, place, and time.  Psychiatric:  Mood and Affect: Mood and affect normal.        Behavior: Behavior normal.        Thought Content: Thought content normal.        Judgment: Judgment normal.     Ortho Exam patient has some pain with left leg extension.  Hamstrings quad anterior tib is strong.  He does have some sciatic notch tenderness on the left negative on the right.  Knee and ankle jerk are intact. Specialty Comments:  No specialty comments available.  Imaging: CLINICAL DATA:  Low back pain and left leg pain.  EXAM: MRI LUMBAR SPINE WITHOUT CONTRAST  TECHNIQUE: Multiplanar, multisequence MR imaging of the lumbar spine was performed. No intravenous contrast was administered.  COMPARISON:  None.  FINDINGS: Segmentation:  5 lumbar type vertebra.  Alignment:  Physiologic.  Vertebrae: No fracture or evidence of discitis. Focal Schmorl's node in  the inferior endplate of L5 with adjacent edema suggesting this may be acute or subacute.  Conus medullaris and cauda equina: Conus extends to the L1-2 level. Conus and cauda equina appear normal.  Paraspinal and other soft tissues: Negative.  Disc levels:  T11-12: Disc desiccation. Otherwise normal.  T12-L1: Disc desiccation. Otherwise normal.  L1-2: Disc desiccation. Tiny broad-based disc bulge with no neural impingement. Otherwise normal.  L2-3: Disc desiccation. Slight degenerative changes of the vertebral endplates. Tiny disc bulge into the left neural foramen without neural impingement. Otherwise normal.  L3-4: Disc desiccation with disc space narrowing asymmetric to the left. Small broad-based disc bulge with a protrusion into the left neural foramen and lateral to it. This could affect the left L3 nerve lateral to the neural foramen. No spinal stenosis. No facet arthritis.  L4-5: Disc space narrowing. Small broad-based disc bulge extending into both neural foramina without neural impingement. No spinal stenosis. No facet arthritis.  L5-S1: Small Schmorl's node in the anterior aspect of the inferior endplate of L5 with edema adjacent to the node, suggesting this is acute or subacute. This could be painful. Small Schmorl's node in the adjacent superior endplate of S1 without significant edema.  Disc desiccation. Small broad-based disc bulge asymmetric into the right neural foramen. This touches the right L5 nerve at the lateral aspect of the neural foramen without compression. No significant facet arthritis.  IMPRESSION: 1. Small Schmorl's node in the inferior endplate of L5 with adjacent edema which may be painful. 2. Left far lateral disc protrusion at L3-4 which could affect the left L3 nerve lateral to the neural foramen. 3. Small broad-based disc bulge at L4-5 without neural impingement. 4. Right foraminal and extraforaminal disc bulge at L5-S1  which might affect the right L5 nerve lateral to the neural foramen.   Electronically Signed   By: Francene Boyers M.D.   On: 07/13/2019 17:03   PMFS History: Patient Active Problem List   Diagnosis Date Noted  . Protrusion of lumbar intervertebral disc 04/13/2020  . Family history of colon cancer 05/12/2019  . Other intervertebral disc degeneration, lumbar region 05/06/2019  . Essential hypertension 09/01/2018  . Insomnia 10/23/2015  . Obstructive sleep apnea 01/16/2015  . Gout 01/05/2015   Past Medical History:  Diagnosis Date  . Gout   . Hypertension   . Medical history non-contributory   . Wears glasses     Family History  Problem Relation Age of Onset  . Colon cancer Mother        Precancerous colon polyps; unsure of actual CRC?  . Diabetes Father   .  Hypertension Father   . Heart attack Father     Past Surgical History:  Procedure Laterality Date  . COLONOSCOPY WITH PROPOFOL N/A 08/02/2019   Procedure: COLONOSCOPY WITH PROPOFOL;  Surgeon: Corbin Ade, MD;  Location: [facility];  Service: Endoscopy;  Laterality: N/A;  9:15am  . KNEE ARTHROSCOPY WITH PATELLAR TENDON REPAIR Left 09/03/2012   Procedure: LEFT KNEE ARTHROSCOPY, LOOSE BODY EXCISION, PATELLAR TENDON REPAIR, EXCISION TIBIAL TUBERCLE SPUR, CHONDROPLASTY PATELLA, EXCISION PLICA;  Surgeon: Loreta Ave, MD;  Location: [facility];  Service: Orthopedics;  Laterality: Left;  . WISDOM TOOTH EXTRACTION     Social History   Occupational History  . Not on file  Tobacco Use  . Smoking status: Never Smoker  . Smokeless tobacco: Never Used  Substance and Sexual Activity  . Alcohol use: Yes    Comment: rare  . Drug use: No  . Sexual activity: Not on file

## 2020-05-04 ENCOUNTER — Ambulatory Visit (INDEPENDENT_AMBULATORY_CARE_PROVIDER_SITE_OTHER): Payer: Self-pay | Admitting: Physical Medicine and Rehabilitation

## 2020-05-04 ENCOUNTER — Encounter: Payer: Self-pay | Admitting: Physical Medicine and Rehabilitation

## 2020-05-04 ENCOUNTER — Ambulatory Visit: Payer: Self-pay

## 2020-05-04 ENCOUNTER — Other Ambulatory Visit: Payer: Self-pay

## 2020-05-04 VITALS — BP 131/83 | HR 76

## 2020-05-04 DIAGNOSIS — M5416 Radiculopathy, lumbar region: Secondary | ICD-10-CM

## 2020-05-04 MED ORDER — DEXAMETHASONE SODIUM PHOSPHATE 10 MG/ML IJ SOLN
15.0000 mg | Freq: Once | INTRAMUSCULAR | Status: AC
  Administered 2020-05-04: 15 mg

## 2020-05-04 NOTE — Progress Notes (Signed)
Pt state lower back pain that travels down his left postier leg to his knee. Pt state bending, standing and sitting for a long time makes the pain worse. Pt state he take over the counter pain meds to help ease the pain.  Numeric Pain Rating Scale and Functional Assessment Average Pain 4   In the last MONTH (on 0-10 scale) has pain interfered with the following?  1. General activity like being  able to carry out your everyday physical activities such as walking, climbing stairs, carrying groceries, or moving a chair?  Rating(8)   +Driver, -BT, -Dye Allergies.

## 2020-05-04 NOTE — Patient Instructions (Signed)

## 2020-05-09 NOTE — Procedures (Signed)
Lumbosacral Transforaminal Epidural Steroid Injection - Sub-Pedicular Approach with Fluoroscopic Guidance  Patient: [name] J Guinea-Bissau Jr.      Date of Birth: 16-Jan-1966 MRN: [id number] PCP: Mechele Claude, MD      Visit Date: 05/04/2020   Universal Protocol:    Date/Time: 05/04/2020  Consent Given By: the patient  Position: PRONE  Additional Comments: Vital signs were monitored before and after the procedure. Patient was prepped and draped in the usual sterile fashion. The correct patient, procedure, and site was verified.   Injection Procedure Details:   Procedure diagnoses: Lumbar radiculopathy [M54.16]    Meds Administered:  Meds ordered this encounter  Medications  . dexamethasone (DECADRON) injection 15 mg    Laterality: Left  Location/Site:  L3-L4  Needle:5.0 in., 22 ga.  Short bevel or Quincke spinal needle  Needle Placement: Transforaminal  Findings:    -Comments: Excellent flow of contrast along the nerve, nerve root and into the epidural space.  Procedure Details: After squaring off the end-plates to get a true AP view, the C-arm was positioned so that an oblique view of the foramen as noted above was visualized. The target area is just inferior to the "nose of the scotty dog" or sub pedicular. The soft tissues overlying this structure were infiltrated with 2-3 ml. of 1% Lidocaine without Epinephrine.  The spinal needle was inserted toward the target using a "trajectory" view along the fluoroscope beam.  Under AP and lateral visualization, the needle was advanced so it did not puncture dura and was located close the 6 O'Clock position of the pedical in AP tracterory. Biplanar projections were used to confirm position. Aspiration was confirmed to be negative for CSF and/or blood. A 1-2 ml. volume of Isovue-250 was injected and flow of contrast was noted at each level. Radiographs were obtained for documentation purposes.   After attaining the desired flow of  contrast documented above, a 0.5 to 1.0 ml test dose of 0.25% Marcaine was injected into each respective transforaminal space.  The patient was observed for 90 seconds post injection.  After no sensory deficits were reported, and normal lower extremity motor function was noted,   the above injectate was administered so that equal amounts of the injectate were placed at each foramen (level) into the transforaminal epidural space.   Additional Comments:  The patient tolerated the procedure well Dressing: 2 x 2 sterile gauze and Band-Aid    Post-procedure details: Patient was observed during the procedure. Post-procedure instructions were reviewed.  Patient left the clinic in stable condition.

## 2020-05-09 NOTE — Progress Notes (Signed)
[name] Fey Guinea-Bissau Jr. - 55 y.o. male MRN [id number]  Date of birth: 1966/02/01  Office Visit Note: Visit Date: 05/04/2020 PCP: Mechele Claude, MD Referred by: Mechele Claude, MD  Subjective: Chief Complaint  Patient presents with  . Lower Back - Pain  . Left Knee - Pain   HPI:  [name] J Guinea-Bissau Jr. is a 55 y.o. male who comes in today at the request of Dr. Annell Greening for planned Left L3-L4 Lumbar epidural steroid injection with fluoroscopic guidance.  The patient has failed conservative care including home exercise, medications, time and activity modification.  This injection will be diagnostic and hopefully therapeutic.  Please see requesting physician notes for further details and justification. MRI reviewed with images and spine model.  MRI reviewed in the note below.    ROS Otherwise per HPI.  Assessment & Plan: Visit Diagnoses:    ICD-10-CM   1. Lumbar radiculopathy  M54.16 XR C-ARM NO REPORT    Epidural Steroid injection    dexamethasone (DECADRON) injection 15 mg    Plan: No additional findings.   Meds & Orders:  Meds ordered this encounter  Medications  . dexamethasone (DECADRON) injection 15 mg    Orders Placed This Encounter  Procedures  . XR C-ARM NO REPORT  . Epidural Steroid injection    Follow-up: Return in about 2 weeks (around 05/18/2020) for Annell Greening, MD.   Procedures: No procedures performed  Lumbosacral Transforaminal Epidural Steroid Injection - Sub-Pedicular Approach with Fluoroscopic Guidance  Patient: [name] J Guinea-Bissau Jr.      Date of Birth: 11-30-1965 MRN: [id number] PCP: Mechele Claude, MD      Visit Date: 05/04/2020   Universal Protocol:    Date/Time: 05/04/2020  Consent Given By: the patient  Position: PRONE  Additional Comments: Vital signs were monitored before and after the procedure. Patient was prepped and draped in the usual sterile fashion. The correct patient, procedure, and site was verified.   Injection Procedure Details:    Procedure diagnoses: Lumbar radiculopathy [M54.16]    Meds Administered:  Meds ordered this encounter  Medications  . dexamethasone (DECADRON) injection 15 mg    Laterality: Left  Location/Site:  L3-L4  Needle:5.0 in., 22 ga.  Short bevel or Quincke spinal needle  Needle Placement: Transforaminal  Findings:    -Comments: Excellent flow of contrast along the nerve, nerve root and into the epidural space.  Procedure Details: After squaring off the end-plates to get a true AP view, the C-arm was positioned so that an oblique view of the foramen as noted above was visualized. The target area is just inferior to the "nose of the scotty dog" or sub pedicular. The soft tissues overlying this structure were infiltrated with 2-3 ml. of 1% Lidocaine without Epinephrine.  The spinal needle was inserted toward the target using a "trajectory" view along the fluoroscope beam.  Under AP and lateral visualization, the needle was advanced so it did not puncture dura and was located close the 6 O'Clock position of the pedical in AP tracterory. Biplanar projections were used to confirm position. Aspiration was confirmed to be negative for CSF and/or blood. A 1-2 ml. volume of Isovue-250 was injected and flow of contrast was noted at each level. Radiographs were obtained for documentation purposes.   After attaining the desired flow of contrast documented above, a 0.5 to 1.0 ml test dose of 0.25% Marcaine was injected into each respective transforaminal space.  The patient was observed for 90 seconds post injection.  After no sensory deficits were reported, and normal lower extremity motor function was noted,   the above injectate was administered so that equal amounts of the injectate were placed at each foramen (level) into the transforaminal epidural space.   Additional Comments:  The patient tolerated the procedure well Dressing: 2 x 2 sterile gauze and Band-Aid    Post-procedure  details: Patient was observed during the procedure. Post-procedure instructions were reviewed.  Patient left the clinic in stable condition.      Clinical History: MRI LUMBAR SPINE WITHOUT CONTRAST  TECHNIQUE: Multiplanar, multisequence MR imaging of the lumbar spine was performed. No intravenous contrast was administered.  COMPARISON:  None.  FINDINGS: Segmentation:  5 lumbar type vertebra.  Alignment:  Physiologic.  Vertebrae: No fracture or evidence of discitis. Focal Schmorl's node in the inferior endplate of L5 with adjacent edema suggesting this may be acute or subacute.  Conus medullaris and cauda equina: Conus extends to the L1-2 level. Conus and cauda equina appear normal.  Paraspinal and other soft tissues: Negative.  Disc levels:  T11-12: Disc desiccation. Otherwise normal.  T12-L1: Disc desiccation. Otherwise normal.  L1-2: Disc desiccation. Tiny broad-based disc bulge with no neural impingement. Otherwise normal.  L2-3: Disc desiccation. Slight degenerative changes of the vertebral endplates. Tiny disc bulge into the left neural foramen without neural impingement. Otherwise normal.  L3-4: Disc desiccation with disc space narrowing asymmetric to the left. Small broad-based disc bulge with a protrusion into the left neural foramen and lateral to it. This could affect the left L3 nerve lateral to the neural foramen. No spinal stenosis. No facet arthritis.  L4-5: Disc space narrowing. Small broad-based disc bulge extending into both neural foramina without neural impingement. No spinal stenosis. No facet arthritis.  L5-S1: Small Schmorl's node in the anterior aspect of the inferior endplate of L5 with edema adjacent to the node, suggesting this is acute or subacute. This could be painful. Small Schmorl's node in the adjacent superior endplate of S1 without significant edema.  Disc desiccation. Small broad-based disc bulge asymmetric  into the right neural foramen. This touches the right L5 nerve at the lateral aspect of the neural foramen without compression. No significant facet arthritis.  IMPRESSION: 1. Small Schmorl's node in the inferior endplate of L5 with adjacent edema which may be painful. 2. Left far lateral disc protrusion at L3-4 which could affect the left L3 nerve lateral to the neural foramen. 3. Small broad-based disc bulge at L4-5 without neural impingement. 4. Right foraminal and extraforaminal disc bulge at L5-S1 which might affect the right L5 nerve lateral to the neural foramen.   Electronically Signed   By: Francene Boyers M.D.   On: 07/13/2019 17:03     Objective:  VS:  HT:    WT:   BMI:     BP:131/83  HR:76bpm  TEMP: ( )  RESP:  Physical Exam Vitals and nursing note reviewed.  Constitutional:      General: He is not in acute distress.    Appearance: Normal appearance. He is obese. He is not ill-appearing.  HENT:     Head: Normocephalic and atraumatic.     Right Ear: External ear normal.     Left Ear: External ear normal.     Nose: No congestion.  Eyes:     Extraocular Movements: Extraocular movements intact.  Cardiovascular:     Rate and Rhythm: Normal rate.     Pulses: Normal pulses.  Pulmonary:     Effort:  Pulmonary effort is normal. No respiratory distress.  Abdominal:     General: There is no distension.     Palpations: Abdomen is soft.  Musculoskeletal:        General: No tenderness or signs of injury.     Cervical back: Neck supple.     Right lower leg: No edema.     Left lower leg: No edema.     Comments: Patient has good distal strength without clonus.  Skin:    Findings: No erythema or rash.  Neurological:     General: No focal deficit present.     Mental Status: He is alert and oriented to person, place, and time.     Sensory: No sensory deficit.     Motor: No weakness or abnormal muscle tone.     Coordination: Coordination normal.  Psychiatric:         Mood and Affect: Mood normal.        Behavior: Behavior normal.      Imaging: No results found.

## 2020-05-25 ENCOUNTER — Ambulatory Visit: Payer: Self-pay | Admitting: Orthopaedic Surgery

## 2020-06-01 ENCOUNTER — Ambulatory Visit (INDEPENDENT_AMBULATORY_CARE_PROVIDER_SITE_OTHER): Admitting: Orthopaedic Surgery

## 2020-06-01 ENCOUNTER — Other Ambulatory Visit: Payer: Self-pay

## 2020-06-01 DIAGNOSIS — M5136 Other intervertebral disc degeneration, lumbar region: Secondary | ICD-10-CM | POA: Diagnosis not present

## 2020-06-01 DIAGNOSIS — M5126 Other intervertebral disc displacement, lumbar region: Secondary | ICD-10-CM | POA: Diagnosis not present

## 2020-06-01 NOTE — Progress Notes (Signed)
Office Visit Note   Patient: [name] J Guinea-Bissau Jr.           Date of Birth: 23-Jun-1965           MRN: [id number] Visit Date: 06/01/2020              Requested by: Mechele Claude, MD [address] [place],  [place] [address] PCP: Mechele Claude, MD   Assessment & Plan: Visit Diagnoses:  1. Other intervertebral disc degeneration, lumbar region   2. Protrusion of lumbar intervertebral disc     Plan: Patient lives in [place] can check into the [employer] to gradually work on a wellness program starting very slowly.  Patient has no claudication symptoms had minimal disc bulge and epidural injection has given him minimal relief.  He is continuing his Relafen which she takes on a daily basis.  He can work slowly on some core strengthening exercises.  Return as needed.  Follow-Up Instructions: Return if symptoms worsen or fail to improve.   Orders:  No orders of the defined types were placed in this encounter.  No orders of the defined types were placed in this encounter.     Procedures: No procedures performed   Clinical Data: No additional findings.   Subjective: Chief Complaint  Patient presents with  . Lower Back - Follow-up    HPI 30 55-year-old male returns he had epidural in February with some improvement at L3-4.  He try to get out and do some activities in the yard had some increased discomfort.  He has primarily aching in his back.  No bowel bladder symptoms.  Patient states she is considering going to the [place] for some of his medications and care.   Review of Systems 14 point system update unchanged from 03/25/2019.  He does have some cervical foraminal stenosis C5-6.   Objective: Vital Signs: There were no vitals taken for this visit.  Physical Exam Constitutional:      Appearance: He is well-developed.  HENT:     Head: Normocephalic and atraumatic.  Eyes:     Pupils: Pupils are equal, round, and reactive to light.  Neck:     Thyroid: No  thyromegaly.     Trachea: No tracheal deviation.  Cardiovascular:     Rate and Rhythm: Normal rate.  Pulmonary:     Effort: Pulmonary effort is normal.     Breath sounds: No wheezing.  Abdominal:     General: Bowel sounds are normal.     Palpations: Abdomen is soft.  Skin:    General: Skin is warm and dry.     Capillary Refill: Capillary refill takes less than 2 seconds.  Neurological:     Mental Status: He is alert and oriented to person, place, and time.  Psychiatric:        Behavior: Behavior normal.        Thought Content: Thought content normal.        Judgment: Judgment normal.     Ortho Exam patient has normal heel toe gait.  No rash over exposed skin.  Good cervical range of motion.  Good knee and hip range of motion.  Specialty Comments:  No specialty comments available.  Imaging: No results found.   PMFS History: Patient Active Problem List   Diagnosis Date Noted  . Protrusion of lumbar intervertebral disc 04/13/2020  . Family history of colon cancer 05/12/2019  . Other intervertebral disc degeneration, lumbar region 05/06/2019  . Essential  hypertension 09/01/2018  . Insomnia 10/23/2015  . Obstructive sleep apnea 01/16/2015  . Gout 01/05/2015   Past Medical History:  Diagnosis Date  . Gout   . Hypertension   . Medical history non-contributory   . Wears glasses     Family History  Problem Relation Age of Onset  . Colon cancer Mother        Precancerous colon polyps; unsure of actual CRC?  . Diabetes Father   . Hypertension Father   . Heart attack Father     Past Surgical History:  Procedure Laterality Date  . COLONOSCOPY WITH PROPOFOL N/A 08/02/2019   Procedure: COLONOSCOPY WITH PROPOFOL;  Surgeon: Corbin Ade, MD;  Location: [facility];  Service: Endoscopy;  Laterality: N/A;  9:15am  . KNEE ARTHROSCOPY WITH PATELLAR TENDON REPAIR Left 09/03/2012   Procedure: LEFT KNEE ARTHROSCOPY, LOOSE BODY EXCISION, PATELLAR TENDON REPAIR, EXCISION TIBIAL  TUBERCLE SPUR, CHONDROPLASTY PATELLA, EXCISION PLICA;  Surgeon: Loreta Ave, MD;  Location: [facility];  Service: Orthopedics;  Laterality: Left;  . WISDOM TOOTH EXTRACTION     Social History   Occupational History  . Not on file  Tobacco Use  . Smoking status: Never Smoker  . Smokeless tobacco: Never Used  Substance and Sexual Activity  . Alcohol use: Yes    Comment: rare  . Drug use: No  . Sexual activity: Not on file

## 2020-06-08 ENCOUNTER — Telehealth: Payer: Self-pay | Admitting: Orthopaedic Surgery

## 2020-06-08 NOTE — Telephone Encounter (Signed)
Records refaxed [facility] [phone]

## 2020-07-03 ENCOUNTER — Other Ambulatory Visit: Payer: Self-pay | Admitting: Family Medicine

## 2020-08-23 ENCOUNTER — Telehealth: Payer: Self-pay | Admitting: Family Medicine

## 2020-08-23 NOTE — Telephone Encounter (Signed)
Prescription clarified with pharmacy and patient aware

## 2020-08-23 NOTE — Telephone Encounter (Signed)
Pt called stating that he is having trouble getting his Metoprolol refilled because he said the pharmacy is saying that they have 2 different Rx's for Metoprolol on file for him with different directions.  Please call pharmacy and call patient.

## 2020-08-24 ENCOUNTER — Other Ambulatory Visit: Payer: Self-pay | Admitting: Family Medicine

## 2020-09-26 ENCOUNTER — Ambulatory Visit (INDEPENDENT_AMBULATORY_CARE_PROVIDER_SITE_OTHER)

## 2020-09-26 ENCOUNTER — Ambulatory Visit (INDEPENDENT_AMBULATORY_CARE_PROVIDER_SITE_OTHER): Admitting: Family Medicine

## 2020-09-26 ENCOUNTER — Encounter: Payer: Self-pay | Admitting: Family Medicine

## 2020-09-26 ENCOUNTER — Other Ambulatory Visit: Payer: Self-pay

## 2020-09-26 VITALS — BP 116/73 | HR 69 | Temp 97.8°F | Ht 73.0 in | Wt 273.8 lb

## 2020-09-26 DIAGNOSIS — M25561 Pain in right knee: Secondary | ICD-10-CM

## 2020-09-26 DIAGNOSIS — F5101 Primary insomnia: Secondary | ICD-10-CM | POA: Diagnosis not present

## 2020-09-26 DIAGNOSIS — M1710 Unilateral primary osteoarthritis, unspecified knee: Secondary | ICD-10-CM

## 2020-09-26 DIAGNOSIS — E785 Hyperlipidemia, unspecified: Secondary | ICD-10-CM

## 2020-09-26 DIAGNOSIS — M5432 Sciatica, left side: Secondary | ICD-10-CM

## 2020-09-26 DIAGNOSIS — I10 Essential (primary) hypertension: Secondary | ICD-10-CM | POA: Diagnosis not present

## 2020-09-26 DIAGNOSIS — M25562 Pain in left knee: Secondary | ICD-10-CM | POA: Diagnosis not present

## 2020-09-26 DIAGNOSIS — Z114 Encounter for screening for human immunodeficiency virus [HIV]: Secondary | ICD-10-CM

## 2020-09-26 DIAGNOSIS — E559 Vitamin D deficiency, unspecified: Secondary | ICD-10-CM

## 2020-09-26 DIAGNOSIS — Z1159 Encounter for screening for other viral diseases: Secondary | ICD-10-CM

## 2020-09-26 MED ORDER — METOPROLOL SUCCINATE ER 100 MG PO TB24: ORAL_TABLET | ORAL | 1 refills | Status: DC

## 2020-09-26 MED ORDER — BETAMETHASONE SOD PHOS & ACET 6 (3-3) MG/ML IJ SUSP
6.0000 mg | Freq: Once | INTRAMUSCULAR | Status: AC
  Administered 2020-09-26: 6 mg via INTRAMUSCULAR

## 2020-09-26 MED ORDER — ESZOPICLONE 3 MG PO TABS: 3.0000 mg | ORAL_TABLET | Freq: Every day | ORAL | 1 refills | Status: DC

## 2020-09-26 NOTE — Progress Notes (Signed)
Subjective:  Patient ID: [name] [name]., male    DOB: Aug 05, 1965  Age: 55 y.o. MRN: [id number]  CC: Medical Management of Chronic Issues   HPI [name] [name]. presents for  follow-up of hypertension. Patient has no history of headache chest pain or shortness of breath or recent cough. Patient also denies symptoms of TIA such as focal numbness or weakness. Patient denies side effects from medication. States taking it regularly.  LEft sciatica painful tto the knee. Tingles to the toes if he lays on his left side.. Has to lay on back tocalm it down,\\Knee pain intermittent. Bilaterl . Hurts on the right at superior patellar region. NKI. Bent over 2-3 weeks ago and developed LBP that lasted a week. Had to decrease activities for a week. Patient in for follow-up of elevated cholesterol. Doing well without complaints on current medication. Denies side effects of statin including myalgia and arthralgia and nausea. Also in today for liver function testing. Currently no chest pain, shortness of breath or other cardiovascular related symptoms noted.  Patient continues to have concerns about low vitamin D.  He needs to have a repeat level to determine if he still needs prescription strength supplements.  He also continues to have trouble with insomnia.  Medications are adequate when he takes them but he tries to do without it he falls asleep without it he does not take 1 although he will wake up during the night several times.  History [name] [name] has a past medical history of Gout, Hypertension, Medical history non-contributory, and Wears glasses.   He has a past surgical history that includes Wisdom tooth extraction; Knee arthroscopy with patellar tendon repair (Left, 09/03/2012); and Colonoscopy with propofol (N/A, 08/02/2019).   His family history includes Colon cancer in his mother; Diabetes in his father; Heart attack in his father; Hypertension in his father.He reports that he has never smoked. He has  never used smokeless tobacco. He reports current alcohol use. He reports that he does not use drugs.  Current Outpatient Medications on File Prior to Visit  Medication Sig Dispense Refill   acetaminophen (TYLENOL) 500 MG tablet Take 500 mg by mouth every 6 (six) hours as needed for mild pain or headache.     allopurinol (ZYLOPRIM) 300 MG tablet Take 1 tablet (300 mg total) by mouth at bedtime. 90 tablet 3   nabumetone (RELAFEN) 500 MG tablet TAKE 1 TABLET(500 MG) BY MOUTH TWICE DAILY AS NEEDED 120 tablet 5   triamterene-hydrochlorothiazide (MAXZIDE-25) 37.5-25 MG tablet Take 1 tablet by mouth daily. 90 tablet 3   No current facility-administered medications on file prior to visit.    ROS Review of Systems  Constitutional: Negative.   HENT: Negative.    Eyes:  Negative for visual disturbance.  Respiratory:  Negative for cough and shortness of breath.   Cardiovascular:  Negative for chest pain and leg swelling.  Gastrointestinal:  Negative for abdominal pain, diarrhea, nausea and vomiting.  Genitourinary:  Negative for difficulty urinating.  Musculoskeletal:  Positive for arthralgias (knee,) and back pain. Negative for myalgias.  Skin:  Negative for rash.  Neurological:  Negative for headaches.  Psychiatric/Behavioral:  Negative for sleep disturbance.    Objective:  BP 116/73   Pulse 69   Temp 97.8 F (36.6 C)   Ht $R'6\' 1"'Kc$  (1.854 m)   Wt 273 lb 12.8 oz (124.2 kg)   SpO2 99%   BMI 36.12 kg/m   BP Readings from Last 3 Encounters:  09/26/20 116/73  05/04/20 131/83  03/29/20 (!) 159/95    Wt Readings from Last 3 Encounters:  09/26/20 273 lb 12.8 oz (124.2 kg)  04/13/20 272 lb (123.4 kg)  03/29/20 272 lb 2 oz (123.4 kg)     Physical Exam    Assessment & Plan:   [name] [name] was seen today for medical management of chronic issues.  Diagnoses and all orders for this visit:  Essential hypertension -     CBC with Differential/Platelet -     CMP14+EGFR  Hyperlipidemia,  unspecified hyperlipidemia type -     Lipid panel  Vitamin D deficiency -     VITAMIN D 25 Hydroxy (Vit-D Deficiency, Fractures)  Primary insomnia -     Eszopiclone 3 MG TABS; Take 1 tablet (3 mg total) by mouth at bedtime. Take immediately before bedtime  Need for hepatitis C screening test -     Hepatitis C antibody  Encounter for screening for HIV -     HIV Antibody (routine testing w rflx)  Sciatica of left side -     betamethasone acetate-betamethasone sodium phosphate (CELESTONE) injection 6 mg -     Ambulatory referral to Orthopedics -     Ambulatory referral to Physical Therapy  Arthritis of knee -     betamethasone acetate-betamethasone sodium phosphate (CELESTONE) injection 6 mg -     Ambulatory referral to Orthopedics -     Ambulatory referral to Physical Therapy -     DG Knee 1-2 Views Left; Future -     DG Knee 1-2 Views Right; Future  Other orders -     metoprolol succinate (TOPROL-XL) 100 MG 24 hr tablet; TAKE 1 TABLET BY MOUTH EVERY DAY WITH OR IMMEDIATELY FOLLOWING A MEAL  Allergies as of 09/26/2020       Reactions   Sulfa Antibiotics    Unknown-was told that        Medication List        Accurate as of September 26, 2020  7:55 PM. If you have any questions, ask your nurse or doctor.          acetaminophen 500 MG tablet Commonly known as: TYLENOL Take 500 mg by mouth every 6 (six) hours as needed for mild pain or headache.   allopurinol 300 MG tablet Commonly known as: ZYLOPRIM Take 1 tablet (300 mg total) by mouth at bedtime.   Eszopiclone 3 MG Tabs Take 1 tablet (3 mg total) by mouth at bedtime. Take immediately before bedtime   metoprolol succinate 100 MG 24 hr tablet Commonly known as: TOPROL-XL TAKE 1 TABLET BY MOUTH EVERY DAY WITH OR IMMEDIATELY FOLLOWING A MEAL   nabumetone 500 MG tablet Commonly known as: RELAFEN TAKE 1 TABLET(500 MG) BY MOUTH TWICE DAILY AS NEEDED   triamterene-hydrochlorothiazide 37.5-25 MG tablet Commonly  known as: MAXZIDE-25 Take 1 tablet by mouth daily.        Meds ordered this encounter  Medications   Eszopiclone 3 MG TABS    Sig: Take 1 tablet (3 mg total) by mouth at bedtime. Take immediately before bedtime    Dispense:  90 tablet    Refill:  1   metoprolol succinate (TOPROL-XL) 100 MG 24 hr tablet    Sig: TAKE 1 TABLET BY MOUTH EVERY DAY WITH OR IMMEDIATELY FOLLOWING A MEAL    Dispense:  90 tablet    Refill:  1    ZERO refills remain on this prescription. Your patient is requesting advance approval of refills for this medication  to PREVENT ANY MISSED DOSES   betamethasone acetate-betamethasone sodium phosphate (CELESTONE) injection 6 mg      Follow-up: Return in about 6 months (around 03/29/2021).  Claretta Fraise, M.D.

## 2020-09-27 ENCOUNTER — Other Ambulatory Visit: Payer: Self-pay | Admitting: *Deleted

## 2020-09-27 DIAGNOSIS — R7989 Other specified abnormal findings of blood chemistry: Secondary | ICD-10-CM

## 2020-09-27 LAB — CBC WITH DIFFERENTIAL/PLATELET
Basophils Absolute: 0 10*3/uL (ref 0.0–0.2)
Basos: 1 %
EOS (ABSOLUTE): 0.1 10*3/uL (ref 0.0–0.4)
Eos: 2 %
Hematocrit: 37.9 % (ref 37.5–51.0)
Hemoglobin: 13.1 g/dL (ref 13.0–17.7)
Immature Grans (Abs): 0 10*3/uL (ref 0.0–0.1)
Immature Granulocytes: 0 %
Lymphocytes Absolute: 1.2 10*3/uL (ref 0.7–3.1)
Lymphs: 19 %
MCH: 30.2 pg (ref 26.6–33.0)
MCHC: 34.6 g/dL (ref 31.5–35.7)
MCV: 87 fL (ref 79–97)
Monocytes Absolute: 0.6 10*3/uL (ref 0.1–0.9)
Monocytes: 9 %
Neutrophils Absolute: 4.4 10*3/uL (ref 1.4–7.0)
Neutrophils: 69 %
Platelets: 191 10*3/uL (ref 150–450)
RBC: 4.34 x10E6/uL (ref 4.14–5.80)
RDW: 12.6 % (ref 11.6–15.4)
WBC: 6.4 10*3/uL (ref 3.4–10.8)

## 2020-09-27 LAB — LIPID PANEL
Chol/HDL Ratio: 4.7 ratio (ref 0.0–5.0)
Cholesterol, Total: 204 mg/dL — ABNORMAL HIGH (ref 100–199)
HDL: 43 mg/dL (ref >39)
LDL Chol Calc (NIH): 144 mg/dL — ABNORMAL HIGH (ref 0–99)
Triglycerides: 94 mg/dL (ref 0–149)
VLDL Cholesterol Cal: 17 mg/dL (ref 5–40)

## 2020-09-27 LAB — CMP14+EGFR
ALT: 22 IU/L (ref 0–44)
AST: 22 IU/L (ref 0–40)
Albumin/Globulin Ratio: 1.7 (ref 1.2–2.2)
Albumin: 4.5 g/dL (ref 3.8–4.9)
Alkaline Phosphatase: 76 IU/L (ref 44–121)
BUN/Creatinine Ratio: 9 (ref 9–20)
BUN: 22 mg/dL (ref 6–24)
Bilirubin Total: 0.3 mg/dL (ref 0.0–1.2)
CO2: 25 mmol/L (ref 20–29)
Calcium: 9.4 mg/dL (ref 8.7–10.2)
Chloride: 103 mmol/L (ref 96–106)
Creatinine, Ser: 2.35 mg/dL — ABNORMAL HIGH (ref 0.76–1.27)
Globulin, Total: 2.6 g/dL (ref 1.5–4.5)
Glucose: 126 mg/dL — ABNORMAL HIGH (ref 65–99)
Potassium: 4.9 mmol/L (ref 3.5–5.2)
Sodium: 140 mmol/L (ref 134–144)
Total Protein: 7.1 g/dL (ref 6.0–8.5)
eGFR: 32 mL/min/{1.73_m2} — ABNORMAL LOW (ref >59)

## 2020-09-27 LAB — HIV ANTIBODY (ROUTINE TESTING W REFLEX): HIV Screen 4th Generation wRfx: NONREACTIVE

## 2020-09-27 LAB — VITAMIN D 25 HYDROXY (VIT D DEFICIENCY, FRACTURES): Vit D, 25-Hydroxy: 28.2 ng/mL — ABNORMAL LOW (ref 30.0–100.0)

## 2020-09-27 LAB — HEPATITIS C ANTIBODY: Hep C Virus Ab: 0.1 s/co ratio (ref 0.0–0.9)

## 2020-10-03 ENCOUNTER — Ambulatory Visit: Attending: Family Medicine | Admitting: Physical Therapy

## 2020-10-03 ENCOUNTER — Other Ambulatory Visit: Payer: Self-pay

## 2020-10-03 ENCOUNTER — Encounter: Payer: Self-pay | Admitting: Physical Therapy

## 2020-10-03 DIAGNOSIS — M6281 Muscle weakness (generalized): Secondary | ICD-10-CM | POA: Diagnosis present

## 2020-10-03 DIAGNOSIS — M25561 Pain in right knee: Secondary | ICD-10-CM | POA: Diagnosis present

## 2020-10-03 DIAGNOSIS — M5416 Radiculopathy, lumbar region: Secondary | ICD-10-CM | POA: Insufficient documentation

## 2020-10-03 DIAGNOSIS — G8929 Other chronic pain: Secondary | ICD-10-CM | POA: Insufficient documentation

## 2020-10-03 DIAGNOSIS — M25562 Pain in left knee: Secondary | ICD-10-CM | POA: Insufficient documentation

## 2020-10-03 NOTE — Patient Instructions (Signed)
Access Code: [email or web address] URL: [email or web address] Date: 10/03/2020 Prepared by: Raynelle Fanning  Exercises Supine Bridge - 2 x daily - 7 x weekly - 1-3 sets - 10 reps Supine Lower Trunk Rotation - 2 x daily - 7 x weekly - 1 sets - 5 reps - 10 sec hold  Patient Education Hospital doctor

## 2020-10-03 NOTE — Therapy (Signed)
[facility] [address] [place], [place], [address] Phone: [phone]   Fax:  [phone]  Physical Therapy Evaluation  Patient Details  Name: [name] J Guinea-Bissau Jr. MRN: [id number] Date of Birth: [birth year] Referring Provider (PT): Mechele Claude MD   Encounter Date: 10/03/2020   PT End of Session - 10/03/20 0812     Visit Number 1    Number of Visits 16    Date for PT Re-Evaluation 11/28/20    Authorization Type [employer]    PT Start Time 0812    PT Stop Time 0901    PT Time Calculation (min) 49 min    Activity Tolerance Patient tolerated treatment well    Behavior During Therapy [facility] for tasks assessed/performed             Past Medical History:  Diagnosis Date   Gout    Hypertension    Medical history non-contributory    Wears glasses     Past Surgical History:  Procedure Laterality Date   COLONOSCOPY WITH PROPOFOL N/A 08/02/2019   Procedure: COLONOSCOPY WITH PROPOFOL;  Surgeon: Corbin Ade, MD;  [facility];  Service: Endoscopy;  Laterality: N/A;  9:15am   KNEE ARTHROSCOPY WITH PATELLAR TENDON REPAIR Left 09/03/2012   Procedure: LEFT KNEE ARTHROSCOPY, LOOSE BODY EXCISION, PATELLAR TENDON REPAIR, EXCISION TIBIAL TUBERCLE SPUR, CHONDROPLASTY PATELLA, EXCISION PLICA;  Surgeon: Loreta Ave, MD;  Location: [facility];  Service: Orthopedics;  Laterality: Left;   WISDOM TOOTH EXTRACTION      There were no vitals filed for this visit.    Subjective Assessment - 10/03/20 0820     Subjective Saw ortho yesterday. Gave him brace for left knee to keep pressure of medial knee where bone on bone is. Right knee has sharp pain behind the knee cap and feels like it will give way at times. Has had sciatica intermittently for 2 years, past 6 mos it has gotten worse.  LBP has been really stiff. Two weeks ago reached to grab tie off the bed and hurt back and it took about a week to resolve. Lifted cooler  yesterday which was okay because had help. Standing for long period of times aggravates. Left hip was hurting Sunday. Sciatic nerve goes down to posterior knee. Sometimes has tingling in left angle. Feels when plugging in phone on small table. Lyiig on left side aggravates. Sometimes wakes him from sleep. Recently went off some meds and now legs swelling if sits too long.    Pertinent History HTN, sleep apnea, arthroscope left knee June 2014    How long can you sit comfortably? 60 min    How long can you stand comfortably? 10 min    Diagnostic tests xrays - severe degeneration left knee; MRI disc protrusion L3/4 (2021)    Patient Stated Goals pain management for knees and back    Currently in Pain? Yes    Pain Score 4     Pain Location Back    Pain Orientation Lower    Pain Descriptors / Indicators Tightness    Pain Type Chronic pain    Pain Radiating Towards left posterior leg to knee; tingling to ankle    Pain Onset More than a month ago    Pain Frequency Constant    Aggravating Factors  bending, lifting, prolonged standing and sitting, lying on left side    Pain Relieving Factors change positions    Effect of Pain on Daily Activities hard to  stretch    Multiple Pain Sites Yes    Pain Score 4    Pain Location Knee    Pain Orientation Right;Left    Pain Descriptors / Indicators Sharp    Pain Type Chronic pain    Pain Onset More than a month ago    Pain Frequency Constant    Aggravating Factors  left sometime sharp and dull behind knee and into medial joint line; right knee behind knee cap.    Pain Relieving Factors rest                [facility] - 10/03/20 0001       Assessment   Medical Diagnosis sciatica of left side; arthritis of knees bil    Referring Provider (PT) Mechele ClaudeWarren Stacks MD    Onset Date/Surgical Date 09/19/20      Precautions   Precautions None      Restrictions   Weight Bearing Restrictions No      Balance Screen   Has the patient fallen in  the past 6 months No    Has the patient had a decrease in activity level because of a fear of falling?  No    Is the patient reluctant to leave their home because of a fear of falling?  No      Home Environment   Living Environment Private residence    Additional Comments stairs uses step to gait; pain both ways      Prior Function   Level of Independence Independent    Vocation Retired      Press photographerosture/Postural Control   Posture Comments even pelvic landmarks; stands in slight hip flexion      ROM / Strength   AROM / PROM / Strength AROM;Strength      AROM   Overall AROM Comments Lumbar limited by pain in flexion to 30 deg (hands to upper thighs), ext WFL, bil SB limtied 50%, left rotationlimited 25%, right 15%; Left knee 0-107 (118 passive); Right knee 0-107 (122 passive)      Strength   Overall Strength Comments Bil hip flex 4/5 with pain in back; ABD 5/5 in sitting, ADD 4/5 in sitting; knee flex/ ext 4+/5 bil      Flexibility   Soft Tissue Assessment /Muscle Length yes    Hamstrings bil HS tightness left>right    Quadriceps bil tightness left > right    Piriformis right > left    Quadratus Lumborum bil tightness in QL and lumbar paraspinals      Palpation   Spinal mobility stiff and painful with PA mobs bil; pain at left L3/4, L4/5, L5/S1    Palpation comment tenderness at left knee medial joint line, patellar tendon, distal ITB; unremarkable in gluteals      Special Tests   Other special tests Positive SLR on left                        Objective measurements completed on examination: See above findings.               PT Education - 10/03/20 1533     Education Details HEP; basic ADL modification (log roll, squatting vs. bending)    Person(s) Educated Patient    Methods Explanation;Demonstration;Handout    Comprehension Verbalized understanding;Returned demonstration                 PT Long Term Goals - 10/03/20 1538  PT LONG  TERM GOAL #1   Title Pt to be I in advance HEP to be able to decrease low back pain to no greater than a 2/10 .    Time 8    Period Weeks    Status New    Target Date 11/28/20      PT LONG TERM GOAL #2   Title PT to be able to sit, stand or walk for over 30 mintues at a time to improve his functional ability for yard and housework.    Time 8    Period Weeks    Status New      PT LONG TERM GOAL #3   Title PT LE strength to be at least 4+ /5 to increase the ease of functional activites and to allow for better body mechanics to protect his back.    Time 8    Period Weeks    Status New      PT LONG TERM GOAL #4   Title Patient able to sleep at least 6 hours without waking from back pain.    Time 8    Period Weeks    Status New      PT LONG TERM GOAL #5   Title Improved bil knee flexion to 115 deg to improve ability to functionally squat with ADLS.    Time 8    Period Weeks    Status New                    Plan - 10/03/20 0910     Clinical Impression Statement Patient presents with acute flare up of left low back pain and sciatica and also complains of chronic bil knee pain left>right. He has had low back and sciatic issues for about 2 years. He has marked limitations in lumbar flexion and pain with PA mobs left > right. He has pain in extension in midline and tight bil hip flexors. Pain affects standing and sitting endurance as well as sleep. Additonally he has pain with ADLS including bending and lifting. His knee pain is chronic and he has severe OA left> right affecting stairs and gait. He will benefit from PT to address these deficits.    Personal Factors and Comorbidities Comorbidity 2    Comorbidities low back pain with sciatica; bil knee OA    Stability/Clinical Decision Making Evolving/Moderate complexity    Clinical Decision Making Low    Rehab Potential Good    PT Frequency 2x / week    PT Duration 8 weeks    PT Treatment/Interventions ADLs/Self Care Home  Management;Cryotherapy;Electrical Stimulation;Moist Heat;Traction;Neuromuscular re-education;Therapeutic exercise;Therapeutic activities;Patient/family education;Manual techniques;Dry needling;Taping;Spinal Manipulations;Joint Manipulations    PT Next Visit Plan Back: work on [employer] and flexibility; extension biased, body mechanics/ADLS; Knees: manual to ITB, flexibility and functional strength (painfree as possible)    PT Home Exercise Plan [email or web address]: bridge and LTR    Consulted and Agree with Plan of Care Patient             Patient will benefit from skilled therapeutic intervention in order to improve the following deficits and impairments:  Decreased range of motion, Increased muscle spasms, Pain, Impaired flexibility, Decreased strength  Visit Diagnosis: Radiculopathy, lumbar region - Plan: PT plan of care cert/re-cert  Muscle weakness (generalized) - Plan: PT plan of care cert/re-cert  Chronic pain of left knee - Plan: PT plan of care cert/re-cert  Chronic pain of right knee - Plan: PT plan of care cert/re-cert  Problem List Patient Active Problem List   Diagnosis Date Noted   Protrusion of lumbar intervertebral disc 04/13/2020   Family history of colon cancer 05/12/2019   Other intervertebral disc degeneration, lumbar region 05/06/2019   Essential hypertension 09/01/2018   Insomnia 10/23/2015   Obstructive sleep apnea 01/16/2015   Gout 01/05/2015    Solon Palm PT 10/03/2020, 3:47 PM  [facility] [address] [place], [place], [address] Phone: [phone]   Fax:  [phone]  Name: [name] J Guinea-Bissau Jr. MRN: [id number] Date of Birth: 1965-08-21

## 2020-10-05 ENCOUNTER — Other Ambulatory Visit: Payer: Self-pay

## 2020-10-05 ENCOUNTER — Ambulatory Visit: Admitting: Physical Therapy

## 2020-10-05 ENCOUNTER — Telehealth: Payer: Self-pay | Admitting: Family Medicine

## 2020-10-05 ENCOUNTER — Other Ambulatory Visit: Payer: Self-pay | Admitting: Family Medicine

## 2020-10-05 ENCOUNTER — Encounter: Payer: Self-pay | Admitting: Physical Therapy

## 2020-10-05 DIAGNOSIS — G8929 Other chronic pain: Secondary | ICD-10-CM

## 2020-10-05 DIAGNOSIS — M5416 Radiculopathy, lumbar region: Secondary | ICD-10-CM

## 2020-10-05 DIAGNOSIS — M6281 Muscle weakness (generalized): Secondary | ICD-10-CM

## 2020-10-05 MED ORDER — TRIAMTERENE-HCTZ 37.5-25 MG PO TABS: 0.5000 | ORAL_TABLET | Freq: Every day | ORAL | 3 refills | Status: DC

## 2020-10-05 NOTE — Telephone Encounter (Signed)
Please let the patient know that I sent their prescription to their pharmacy. Thanks, WS 

## 2020-10-05 NOTE — Patient Instructions (Signed)
Access Code: [email or web address] URL: [email or web address] Date: 10/05/2020 Prepared by: Raynelle Fanning  Exercises Supine Bridge - 2 x daily - 7 x weekly - 1-3 sets - 10 reps Supine Lower Trunk Rotation - 2 x daily - 7 x weekly - 1 sets - 5 reps - 10 sec hold Sidelying Thoracic Lumbar Rotation - 2 x daily - 7 x weekly - 1 sets - 1 reps - as long as comfortable hold Seated Piriformis Stretch with Trunk Bend - 2 x daily - 7 x weekly - 3 reps - 1 sets - 30-60 sec hold Supine Sciatic Nerve Glide - 1 x daily - 7 x weekly - 1 sets - 10 reps - 5 sec hold

## 2020-10-05 NOTE — Therapy (Signed)
[facility] [address] [place], [place], [address] Phone: [phone]   Fax:  [phone]  Physical Therapy Treatment  Patient Details  Name: [name] J Guinea-Bissau Jr. MRN: [id number] Date of Birth: 1965/05/28 Referring Provider (PT): Mechele Claude MD   Encounter Date: 10/05/2020   PT End of Session - 10/05/20 0828     Visit Number 2    Number of Visits 16    Date for PT Re-Evaluation 11/28/20    Authorization Type [employer]    PT Start Time 0825    PT Stop Time 0909    PT Time Calculation (min) 44 min    Activity Tolerance Patient tolerated treatment well    Behavior During Therapy [facility] for tasks assessed/performed             Past Medical History:  Diagnosis Date   Gout    Hypertension    Medical history non-contributory    Wears glasses     Past Surgical History:  Procedure Laterality Date   COLONOSCOPY WITH PROPOFOL N/A 08/02/2019   Procedure: COLONOSCOPY WITH PROPOFOL;  Surgeon: Corbin Ade, MD;  [facility];  Service: Endoscopy;  Laterality: N/A;  9:15am   KNEE ARTHROSCOPY WITH PATELLAR TENDON REPAIR Left 09/03/2012   Procedure: LEFT KNEE ARTHROSCOPY, LOOSE BODY EXCISION, PATELLAR TENDON REPAIR, EXCISION TIBIAL TUBERCLE SPUR, CHONDROPLASTY PATELLA, EXCISION PLICA;  Surgeon: Loreta Ave, MD;  Location: [facility];  Service: Orthopedics;  Laterality: Left;   WISDOM TOOTH EXTRACTION      There were no vitals filed for this visit.   Subjective Assessment - 10/05/20 0828     Subjective The sciatica is hurting today. The back the pain is minimal.    Pertinent History HTN, sleep apnea, arthroscope left knee June 2014    How long can you sit comfortably? 60 min    How long can you stand comfortably? 10 min    Diagnostic tests xrays - severe degeneration left knee; MRI disc protrusion L3/4 (2021)    Patient Stated Goals pain management for knees and back    Currently in Pain? Yes    Pain Score  4     Pain Location Back    Pain Orientation Lower    Pain Descriptors / Indicators Tightness    Pain Type Chronic pain                               [facility] Adult PT Treatment/Exercise - 10/05/20 0001       Self-Care   Self-Care Other Self-Care Comments    Other Self-Care Comments  education on positioning using spine model. Advised attempting pillow under waist as well. Also discussed seated posture on optimal positioning to decrease sciatica and poor circulation.      Exercises   Exercises Knee/Hip      Knee/Hip Exercises: Stretches   Hip Flexor Stretch Limitations attempted standing with elbows on wall with hips dropping to wall 5 x 5 sec (increased his sciatica)    Piriformis Stretch Both;2 reps;30 seconds    Piriformis Stretch Limitations could only tolerate 15-20 sec on left      Knee/Hip Exercises: Supine   Other Supine Knee/Hip Exercises sciatic nerve glide with ankle pump: 5 sec on/5 sec off x 10      Knee/Hip Exercises: Sidelying   Other Sidelying Knee/Hip Exercises Positioning: on right side, knees only slightly bent, left upper trunk rotation  to mat: decreased pain in left LE; feels more in buttocks      Modalities   Modalities Electrical Stimulation;Moist Heat      Moist Heat Therapy   Number Minutes Moist Heat 10 Minutes    Moist Heat Location Lumbar Spine      Electrical Stimulation   Electrical Stimulation Location bil lumbar    Electrical Stimulation Action IFC    Electrical Stimulation Parameters 10 min to tolerance    Electrical Stimulation Goals Pain;Tone      Manual Therapy   Manual Therapy Joint mobilization    Joint Mobilization UPA mobs bil to lumbar spine                    PT Education - 10/05/20 1045     Education Details HEP progressed: see self care also    Person(s) Educated Patient    Methods Explanation;Demonstration;Handout    Comprehension Verbalized understanding;Returned demonstration                  PT Long Term Goals - 10/03/20 1538       PT LONG TERM GOAL #1   Title Pt to be I in advance HEP to be able to decrease low back pain to no greater than a 2/10 .    Time 8    Period Weeks    Status New    Target Date 11/28/20      PT LONG TERM GOAL #2   Title PT to be able to sit, stand or walk for over 30 mintues at a time to improve his functional ability for yard and housework.    Time 8    Period Weeks    Status New      PT LONG TERM GOAL #3   Title PT LE strength to be at least 4+ /5 to increase the ease of functional activites and to allow for better body mechanics to protect his back.    Time 8    Period Weeks    Status New      PT LONG TERM GOAL #4   Title Patient able to sleep at least 6 hours without waking from back pain.    Time 8    Period Weeks    Status New      PT LONG TERM GOAL #5   Title Improved bil knee flexion to 115 deg to improve ability to functionally squat with ADLS.    Time 8    Period Weeks    Status New                   Plan - 10/05/20 1046     Clinical Impression Statement Patient 10 min late. He reported some relief with TE since initial visit. He tolerated piriformis stretch fairly on left side, but hip flexor stretch in standing increased sx. He is still very stiff in bil lumbar with PA mobs. Trial of estim/heat to decrease pain and tissue tension completed. He may benefit from DN also. No time to address knees today. Possibly try traction next visit if pt interested.    Comorbidities low back pain with sciatica; bil knee OA    PT Treatment/Interventions ADLs/Self Care Home Management;Cryotherapy;Electrical Stimulation;Moist Heat;Traction;Neuromuscular re-education;Therapeutic exercise;Therapeutic activities;Patient/family education;Manual techniques;Dry needling;Taping;Spinal Manipulations;Joint Manipulations    PT Next Visit Plan Back: work on [employer] and flexibility; try traction; extension biased TEwhen tolerated,  body mechanics/ADLS; Knees: manual to ITB, flexibility and functional strength (painfree as possible)  PT Home Exercise Plan [email or web address]: bridge, LTR, supine nerve glide, seated fig 4; positioning right SDLY with upper trunk rotation             Patient will benefit from skilled therapeutic intervention in order to improve the following deficits and impairments:  Decreased range of motion, Increased muscle spasms, Pain, Impaired flexibility, Decreased strength  Visit Diagnosis: Radiculopathy, lumbar region  Muscle weakness (generalized)  Chronic pain of left knee  Chronic pain of right knee     Problem List Patient Active Problem List   Diagnosis Date Noted   Protrusion of lumbar intervertebral disc 04/13/2020   Family history of colon cancer 05/12/2019   Other intervertebral disc degeneration, lumbar region 05/06/2019   Essential hypertension 09/01/2018   Insomnia 10/23/2015   Obstructive sleep apnea 01/16/2015   Gout 01/05/2015   Solon Palm PT 10/05/2020, 10:55 AM  [facility] [address] [place], [place], [address] Phone: [phone]   Fax:  [phone]  Name: [name] J Guinea-Bissau Jr. MRN: [id number] Date of Birth: [birth year]

## 2020-10-05 NOTE — Telephone Encounter (Signed)
Resume Maxzide, 1/2 daily

## 2020-10-05 NOTE — Telephone Encounter (Signed)
Patient aware.

## 2020-10-09 ENCOUNTER — Other Ambulatory Visit

## 2020-10-09 ENCOUNTER — Ambulatory Visit: Attending: Family Medicine | Admitting: Physical Therapy

## 2020-10-09 ENCOUNTER — Other Ambulatory Visit: Payer: Self-pay

## 2020-10-09 DIAGNOSIS — M6281 Muscle weakness (generalized): Secondary | ICD-10-CM | POA: Insufficient documentation

## 2020-10-09 DIAGNOSIS — R7989 Other specified abnormal findings of blood chemistry: Secondary | ICD-10-CM

## 2020-10-09 DIAGNOSIS — M25562 Pain in left knee: Secondary | ICD-10-CM | POA: Diagnosis present

## 2020-10-09 DIAGNOSIS — G8929 Other chronic pain: Secondary | ICD-10-CM | POA: Diagnosis present

## 2020-10-09 DIAGNOSIS — M25561 Pain in right knee: Secondary | ICD-10-CM | POA: Diagnosis present

## 2020-10-09 DIAGNOSIS — M5416 Radiculopathy, lumbar region: Secondary | ICD-10-CM | POA: Diagnosis present

## 2020-10-09 LAB — BMP8+EGFR
BUN/Creatinine Ratio: 9 (ref 9–20)
BUN: 20 mg/dL (ref 6–24)
CO2: 25 mmol/L (ref 20–29)
Calcium: 9.3 mg/dL (ref 8.7–10.2)
Chloride: 101 mmol/L (ref 96–106)
Creatinine, Ser: 2.12 mg/dL — ABNORMAL HIGH (ref 0.76–1.27)
Glucose: 96 mg/dL (ref 65–99)
Potassium: 4.1 mmol/L (ref 3.5–5.2)
Sodium: 139 mmol/L (ref 134–144)
eGFR: 36 mL/min/{1.73_m2} — ABNORMAL LOW (ref >59)

## 2020-10-09 NOTE — Therapy (Signed)
[facility] [address] [place], [place], [address] Phone: [phone]   Fax:  [phone]  Physical Therapy Treatment  Patient Details  Name: [name] J Guinea-Bissau Jr. MRN: [id number] Date of Birth: 1965-04-04 Referring Provider (PT): Mechele Claude MD   Encounter Date: 10/09/2020   PT End of Session - 10/09/20 0914     Visit Number 3    Number of Visits 16    Date for PT Re-Evaluation 11/28/20    Authorization Type [employer]    PT Start Time 0911    PT Stop Time 0954    PT Time Calculation (min) 43 min    Activity Tolerance Patient tolerated treatment well    Behavior During Therapy [facility] for tasks assessed/performed             Past Medical History:  Diagnosis Date   Gout    Hypertension    Medical history non-contributory    Wears glasses     Past Surgical History:  Procedure Laterality Date   COLONOSCOPY WITH PROPOFOL N/A 08/02/2019   Procedure: COLONOSCOPY WITH PROPOFOL;  Surgeon: Corbin Ade, MD;  [facility];  Service: Endoscopy;  Laterality: N/A;  9:15am   KNEE ARTHROSCOPY WITH PATELLAR TENDON REPAIR Left 09/03/2012   Procedure: LEFT KNEE ARTHROSCOPY, LOOSE BODY EXCISION, PATELLAR TENDON REPAIR, EXCISION TIBIAL TUBERCLE SPUR, CHONDROPLASTY PATELLA, EXCISION PLICA;  Surgeon: Loreta Ave, MD;  Location: [facility];  Service: Orthopedics;  Laterality: Left;   WISDOM TOOTH EXTRACTION      There were no vitals filed for this visit.   Subjective Assessment - 10/09/20 0912     Subjective Covid-19 screening performed upon arrival. Patient reported ongoing discomfort today.    Pertinent History HTN, sleep apnea, arthroscope left knee June 2014    How long can you sit comfortably? 60 min    How long can you stand comfortably? 10 min    Diagnostic tests xrays - severe degeneration left knee; MRI disc protrusion L3/4 (2021)    Patient Stated Goals pain management for knees and back    Currently in  Pain? Yes    Pain Score 5     Pain Location Back    Pain Orientation Lower    Pain Descriptors / Indicators Tightness    Pain Type Chronic pain    Pain Radiating Towards left posterior leg to knee    Pain Onset More than a month ago    Pain Frequency Constant    Aggravating Factors  certain movements and prolong positions    Pain Relieving Factors movement                               [facility] Adult PT Treatment/Exercise - 10/09/20 0001       Self-Care   Self-Care ADL's;Lifting;Posture;Other Self-Care Comments    Other Self-Care Comments  HEP provided for all above      Exercises   Exercises Lumbar;Knee/Hip      Lumbar Exercises: Standing   Other Standing Lumbar Exercises standing wall pelvic tilt with walll angel      Lumbar Exercises: Supine   Ab Set 5 reps;5 seconds    Clam 20 reps   w red band   Bent Knee Raise 5 reps;5 seconds    Straight Leg Raise 3 seconds   2x10     Knee/Hip Exercises: Stretches   Piriformis Stretch Both;2 reps;30 seconds  Other Knee/Hip Stretches left SKTC 30sec x2reps      Knee/Hip Exercises: Aerobic   Nustep L3 x66min UE/LE activity      Knee/Hip Exercises: Standing   Other Standing Knee Exercises lat pull with blue XTS x20      Moist Heat Therapy   Number Minutes Moist Heat 10 Minutes    Moist Heat Location Lumbar Spine      Electrical Stimulation   Electrical Stimulation Location bil lumbar    Electrical Stimulation Action IFC    Electrical Stimulation Parameters 80-150hz  x10in    Electrical Stimulation Goals Pain;Tone                    PT Education - 10/09/20 0919     Education Details Posture awareness techniques sheet    Person(s) Educated Patient    Methods Explanation;Demonstration;Handout    Comprehension Verbalized understanding;Returned demonstration                 PT Long Term Goals - 10/09/20 0916       PT LONG TERM GOAL #1   Title Pt to be I in advance HEP to be able to  decrease low back pain to no greater than a 2/10 .    Time 8    Period Weeks    Status On-going      PT LONG TERM GOAL #2   Title PT to be able to sit, stand or walk for over 30 mintues at a time to improve his functional ability for yard and housework.    Time 8    Period Weeks    Status On-going      PT LONG TERM GOAL #3   Title PT LE strength to be at least 4+ /5 to increase the ease of functional activites and to allow for better body mechanics to protect his back.    Time 8    Period Weeks    Status On-going      PT LONG TERM GOAL #4   Title Patient able to sleep at least 6 hours without waking from back pain.    Time 8    Period Weeks    Status On-going      PT LONG TERM GOAL #5   Title Improved bil knee flexion to 115 deg to improve ability to functionally squat with ADLS.    Time 8    Period Weeks    Status On-going                   Plan - 10/09/20 4332     Clinical Impression Statement Patient tolerated treatment fair due to ogoing pain symptoms. Today focused on posture awareness techniques with sheet provided. Educated on core activation strengthening to improve strength. Patient is doing well with initial HEP. Patient current goals progressing due to pain limitations. Normal response upon removal of modalities.    Personal Factors and Comorbidities Comorbidity 2    Comorbidities low back pain with sciatica; bil knee OA    Stability/Clinical Decision Making Evolving/Moderate complexity    Rehab Potential Good    PT Frequency 2x / week    PT Duration 8 weeks    PT Treatment/Interventions ADLs/Self Care Home Management;Cryotherapy;Electrical Stimulation;Moist Heat;Traction;Neuromuscular re-education;Therapeutic exercise;Therapeutic activities;Patient/family education;Manual techniques;Dry needling;Taping;Spinal Manipulations;Joint Manipulations    PT Next Visit Plan Back: work on [employer] and flexibility; try traction; extension biased TEwhen tolerated, body  mechanics/ADLS; Knees: manual to ITB, flexibility and functional strength (painfree as possible)  Consulted and Agree with Plan of Care Patient             Patient will benefit from skilled therapeutic intervention in order to improve the following deficits and impairments:  Decreased range of motion, Increased muscle spasms, Pain, Impaired flexibility, Decreased strength  Visit Diagnosis: Radiculopathy, lumbar region  Muscle weakness (generalized)  Chronic pain of left knee  Chronic pain of right knee     Problem List Patient Active Problem List   Diagnosis Date Noted   Protrusion of lumbar intervertebral disc 04/13/2020   Family history of colon cancer 05/12/2019   Other intervertebral disc degeneration, lumbar region 05/06/2019   Essential hypertension 09/01/2018   Insomnia 10/23/2015   Obstructive sleep apnea 01/16/2015   Gout 01/05/2015    Katrisha Segall P, PTA 10/09/2020, 10:45 AM  [facility] [address] [place], [place], [address] Phone: [phone]   Fax:  [phone]  Name: [name] J Guinea-Bissau Jr. MRN: [id number] Date of Birth: Jan 29, 1966

## 2020-10-09 NOTE — Patient Instructions (Signed)

## 2020-10-09 NOTE — Therapy (Signed)
[facility] [address] [place], [place], [address] Phone: [phone]   Fax:  [phone]  Physical Therapy Treatment  Patient Details  Name: [name] J Guinea-Bissau Jr. MRN: [id number] Date of Birth: Dec 12, 1965 Referring Provider (PT): Mechele Claude MD   Encounter Date: 10/09/2020   PT End of Session - 10/09/20 0914     Visit Number 3    Number of Visits 16    Date for PT Re-Evaluation 11/28/20    Authorization Type [employer]    PT Start Time 0911    PT Stop Time 0958    PT Time Calculation (min) 47 min    Activity Tolerance Patient tolerated treatment well    Behavior During Therapy [facility] for tasks assessed/performed             Past Medical History:  Diagnosis Date   Gout    Hypertension    Medical history non-contributory    Wears glasses     Past Surgical History:  Procedure Laterality Date   COLONOSCOPY WITH PROPOFOL N/A 08/02/2019   Procedure: COLONOSCOPY WITH PROPOFOL;  Surgeon: Corbin Ade, MD;  [facility];  Service: Endoscopy;  Laterality: N/A;  9:15am   KNEE ARTHROSCOPY WITH PATELLAR TENDON REPAIR Left 09/03/2012   Procedure: LEFT KNEE ARTHROSCOPY, LOOSE BODY EXCISION, PATELLAR TENDON REPAIR, EXCISION TIBIAL TUBERCLE SPUR, CHONDROPLASTY PATELLA, EXCISION PLICA;  Surgeon: Loreta Ave, MD;  Location: [facility];  Service: Orthopedics;  Laterality: Left;   WISDOM TOOTH EXTRACTION      There were no vitals filed for this visit.   Subjective Assessment - 10/09/20 0912     Subjective Covid-19 screening performed upon arrival. Patient reported ongoing discomfort today.    Pertinent History HTN, sleep apnea, arthroscope left knee June 2014    How long can you sit comfortably? 60 min    How long can you stand comfortably? 10 min    Diagnostic tests xrays - severe degeneration left knee; MRI disc protrusion L3/4 (2021)    Patient Stated Goals pain management for knees and back    Currently in  Pain? Yes    Pain Score 5     Pain Location Back    Pain Orientation Lower    Pain Descriptors / Indicators Tightness    Pain Type Chronic pain    Pain Radiating Towards left posterior leg to knee    Pain Onset More than a month ago    Pain Frequency Constant    Aggravating Factors  certain movements and prolong positions    Pain Relieving Factors movement                               [facility] Adult PT Treatment/Exercise - 10/09/20 0001       Self-Care   Self-Care ADL's;Lifting;Posture;Other Self-Care Comments    Other Self-Care Comments  HEP provided for all above      Exercises   Exercises Lumbar;Knee/Hip      Lumbar Exercises: Standing   Other Standing Lumbar Exercises standing wall pelvic tilt with walll angel      Lumbar Exercises: Supine   Ab Set 5 reps;5 seconds    Clam 20 reps   w red band   Bent Knee Raise 5 reps;5 seconds    Straight Leg Raise 3 seconds   2x10     Knee/Hip Exercises: Stretches   Piriformis Stretch Both;2 reps;30 seconds  Other Knee/Hip Stretches left SKTC 30sec x2reps      Knee/Hip Exercises: Aerobic   Nustep L3 x19min UE/LE activity      Knee/Hip Exercises: Standing   Other Standing Knee Exercises lat pull with blue XTS x20      Moist Heat Therapy   Number Minutes Moist Heat 10 Minutes    Moist Heat Location Lumbar Spine      Electrical Stimulation   Electrical Stimulation Location bil lumbar    Electrical Stimulation Action IFC    Electrical Stimulation Parameters 80-150hz  x10in    Electrical Stimulation Goals Pain;Tone                    PT Education - 10/09/20 0919     Education Details Posture awareness techniques sheet    Person(s) Educated Patient    Methods Explanation;Demonstration;Handout    Comprehension Verbalized understanding;Returned demonstration                 PT Long Term Goals - 10/09/20 0916       PT LONG TERM GOAL #1   Title Pt to be I in advance HEP to be able to  decrease low back pain to no greater than a 2/10 .    Time 8    Period Weeks    Status On-going      PT LONG TERM GOAL #2   Title PT to be able to sit, stand or walk for over 30 mintues at a time to improve his functional ability for yard and housework.    Time 8    Period Weeks    Status On-going      PT LONG TERM GOAL #3   Title PT LE strength to be at least 4+ /5 to increase the ease of functional activites and to allow for better body mechanics to protect his back.    Time 8    Period Weeks    Status On-going      PT LONG TERM GOAL #4   Title Patient able to sleep at least 6 hours without waking from back pain.    Time 8    Period Weeks    Status On-going      PT LONG TERM GOAL #5   Title Improved bil knee flexion to 115 deg to improve ability to functionally squat with ADLS.    Time 8    Period Weeks    Status On-going                   Plan - 10/09/20 0947     Clinical Impression Statement Patient tolerated treatment fair due to ogoing pain symptoms. Today focused on posture awareness techniques with sheet provided. Educated on core activation strengthening to improve strength. Patient is doing well with initial HEP. Patient current goals progressing due to pain limitations. Normal response upon removal of modalities.    Personal Factors and Comorbidities Comorbidity 2    Comorbidities low back pain with sciatica; bil knee OA    Stability/Clinical Decision Making Evolving/Moderate complexity    Rehab Potential Good    PT Frequency 2x / week    PT Duration 8 weeks    PT Treatment/Interventions ADLs/Self Care Home Management;Cryotherapy;Electrical Stimulation;Moist Heat;Traction;Neuromuscular re-education;Therapeutic exercise;Therapeutic activities;Patient/family education;Manual techniques;Dry needling;Taping;Spinal Manipulations;Joint Manipulations    PT Next Visit Plan Back: work on [employer] and flexibility; try traction; extension biased TEwhen tolerated, body  mechanics/ADLS; Knees: manual to ITB, flexibility and functional strength (painfree as possible)  Consulted and Agree with Plan of Care Patient             Patient will benefit from skilled therapeutic intervention in order to improve the following deficits and impairments:  Decreased range of motion, Increased muscle spasms, Pain, Impaired flexibility, Decreased strength  Visit Diagnosis: Radiculopathy, lumbar region  Muscle weakness (generalized)  Chronic pain of left knee  Chronic pain of right knee     Problem List Patient Active Problem List   Diagnosis Date Noted   Protrusion of lumbar intervertebral disc 04/13/2020   Family history of colon cancer 05/12/2019   Other intervertebral disc degeneration, lumbar region 05/06/2019   Essential hypertension 09/01/2018   Insomnia 10/23/2015   Obstructive sleep apnea 01/16/2015   Gout 01/05/2015    Tosha Belgarde P, PTA 10/09/2020, 10:36 AM  [facility] [address] [place], [place], [address] Phone: [phone]   Fax:  [phone]  Name: Kaymen J Guinea-Bissau Jr. MRN: [id number] Date of Birth: [birth year]

## 2020-10-11 ENCOUNTER — Ambulatory Visit: Admitting: Physical Therapy

## 2020-10-11 ENCOUNTER — Other Ambulatory Visit: Payer: Self-pay

## 2020-10-11 DIAGNOSIS — G8929 Other chronic pain: Secondary | ICD-10-CM

## 2020-10-11 DIAGNOSIS — M6281 Muscle weakness (generalized): Secondary | ICD-10-CM

## 2020-10-11 DIAGNOSIS — M5416 Radiculopathy, lumbar region: Secondary | ICD-10-CM | POA: Diagnosis not present

## 2020-10-11 DIAGNOSIS — M25562 Pain in left knee: Secondary | ICD-10-CM

## 2020-10-11 NOTE — Therapy (Signed)
[facility] [address] [place], [place], [address] Phone: [phone]   Fax:  [phone]  Physical Therapy Treatment  Patient Details  Name: [name] J Guinea-Bissau Jr. MRN: [id number] Date of Birth: [birth year] Referring Provider (PT): Mechele Claude MD   Encounter Date: 10/11/2020   PT End of Session - 10/11/20 0830     Visit Number 4    Number of Visits 16    Date for PT Re-Evaluation 11/28/20    Authorization Type [employer]    PT Start Time 0823    PT Stop Time 0914    PT Time Calculation (min) 51 min    Activity Tolerance Patient tolerated treatment well    Behavior During Therapy [facility] for tasks assessed/performed             Past Medical History:  Diagnosis Date   Gout    Hypertension    Medical history non-contributory    Wears glasses     Past Surgical History:  Procedure Laterality Date   COLONOSCOPY WITH PROPOFOL N/A 08/02/2019   Procedure: COLONOSCOPY WITH PROPOFOL;  Surgeon: Corbin Ade, MD;  [facility];  Service: Endoscopy;  Laterality: N/A;  9:15am   KNEE ARTHROSCOPY WITH PATELLAR TENDON REPAIR Left 09/03/2012   Procedure: LEFT KNEE ARTHROSCOPY, LOOSE BODY EXCISION, PATELLAR TENDON REPAIR, EXCISION TIBIAL TUBERCLE SPUR, CHONDROPLASTY PATELLA, EXCISION PLICA;  Surgeon: Loreta Ave, MD;  Location: [facility];  Service: Orthopedics;  Laterality: Left;   WISDOM TOOTH EXTRACTION      There were no vitals filed for this visit.   Subjective Assessment - 10/11/20 0824     Subjective Covid-19 screening performed upon arrival. Patient reported some ongoing pain yet doing ok this AM    Pertinent History HTN, sleep apnea, arthroscope left knee June 2014    How long can you sit comfortably? 60 min    How long can you stand comfortably? 10 min    Diagnostic tests xrays - severe degeneration left knee; MRI disc protrusion L3/4 (2021)    Patient Stated Goals pain management for knees and back     Currently in Pain? Yes    Pain Score 5     Pain Location Back    Pain Orientation Left    Pain Descriptors / Indicators Discomfort;Tightness    Pain Type Chronic pain    Pain Radiating Towards left post leg to knee    Pain Onset More than a month ago    Pain Frequency Constant    Aggravating Factors  certain movements and prolong positions    Pain Relieving Factors movement                               [facility] Adult PT Treatment/Exercise - 10/11/20 0001       Lumbar Exercises: Supine   Bridge with clamshell 20 reps;3 seconds   with green band   Straight Leg Raise 3 seconds   2x10 with abdominal bracing     Knee/Hip Exercises: Aerobic   Nustep L4 x58min UE/LE activity      Knee/Hip Exercises: Machines for Strengthening   Cybex Knee Extension 10# x20 reps    Cybex Knee Flexion 20# x20reps      Knee/Hip Exercises: Standing   Hip Abduction Stengthening;Both;Knee bent   lateral stepping with yellow band on ankles 2x fatigue   Other Standing Knee Exercises lat pull and diagnols with blue XTS  x20 reps each      Moist Heat Therapy   Number Minutes Moist Heat 10 Minutes    Moist Heat Location Lumbar Spine      Electrical Stimulation   Electrical Stimulation Location bil lumbar    Electrical Stimulation Action IFC    Electrical Stimulation Parameters 80-150hz  x84min    Electrical Stimulation Goals Pain;Tone      Manual Therapy   Manual Therapy Soft tissue mobilization    Soft tissue mobilization manual STW to left SIJ and low back to reduce pain and tone                         PT Long Term Goals - 10/09/20 0916       PT LONG TERM GOAL #1   Title Pt to be I in advance HEP to be able to decrease low back pain to no greater than a 2/10 .    Time 8    Period Weeks    Status On-going      PT LONG TERM GOAL #2   Title PT to be able to sit, stand or walk for over 30 mintues at a time to improve his functional ability for yard and housework.     Time 8    Period Weeks    Status On-going      PT LONG TERM GOAL #3   Title PT LE strength to be at least 4+ /5 to increase the ease of functional activites and to allow for better body mechanics to protect his back.    Time 8    Period Weeks    Status On-going      PT LONG TERM GOAL #4   Title Patient able to sleep at least 6 hours without waking from back pain.    Time 8    Period Weeks    Status On-going      PT LONG TERM GOAL #5   Title Improved bil knee flexion to 115 deg to improve ability to functionally squat with ADLS.    Time 8    Period Weeks    Status On-going                   Plan - 10/11/20 0907     Clinical Impression Statement Patient tolerated treatment well today. No increased pain with exercises today. Today focused on core progression and knee stabilization exercises to improve function and strength. Patient had palpable painand trigger point on left SI area. Patient current goals progressing this week.    Personal Factors and Comorbidities Comorbidity 2    Comorbidities low back pain with sciatica; bil knee OA    Stability/Clinical Decision Making Evolving/Moderate complexity    Rehab Potential Good    PT Frequency 2x / week    PT Duration 8 weeks    PT Treatment/Interventions ADLs/Self Care Home Management;Cryotherapy;Electrical Stimulation;Moist Heat;Traction;Neuromuscular re-education;Therapeutic exercise;Therapeutic activities;Patient/family education;Manual techniques;Dry needling;Taping;Spinal Manipulations;Joint Manipulations    PT Next Visit Plan Back: work on [employer] and flexibility; try traction; extension biased TEwhen tolerated, body mechanics/ADLS; Knees: manual to ITB, flexibility and functional strength (painfree as possible)    Consulted and Agree with Plan of Care Patient             Patient will benefit from skilled therapeutic intervention in order to improve the following deficits and impairments:  Decreased range of  motion, Increased muscle spasms, Pain, Impaired flexibility, Decreased strength  Visit Diagnosis: Radiculopathy, lumbar  region  Muscle weakness (generalized)  Chronic pain of left knee  Chronic pain of right knee     Problem List Patient Active Problem List   Diagnosis Date Noted   Protrusion of lumbar intervertebral disc 04/13/2020   Family history of colon cancer 05/12/2019   Other intervertebral disc degeneration, lumbar region 05/06/2019   Essential hypertension 09/01/2018   Insomnia 10/23/2015   Obstructive sleep apnea 01/16/2015   Gout 01/05/2015    Ritaj Dullea P, PTA 10/11/2020, 9:19 AM  [facility] [address] [place], [place], [address] Phone: [phone]   Fax:  [phone]  Name: [name] J Guinea-Bissau Jr. MRN: [id number] Date of Birth: May 17, 1965

## 2020-10-12 ENCOUNTER — Other Ambulatory Visit: Payer: Self-pay | Admitting: Family Medicine

## 2020-10-12 ENCOUNTER — Encounter: Payer: Self-pay | Admitting: Family Medicine

## 2020-10-13 NOTE — Progress Notes (Signed)
Pt. Was notified of results and plan of action. WS

## 2020-10-16 ENCOUNTER — Ambulatory Visit: Admitting: Physical Therapy

## 2020-10-16 ENCOUNTER — Other Ambulatory Visit: Payer: Self-pay

## 2020-10-16 DIAGNOSIS — M6281 Muscle weakness (generalized): Secondary | ICD-10-CM

## 2020-10-16 DIAGNOSIS — M25561 Pain in right knee: Secondary | ICD-10-CM

## 2020-10-16 DIAGNOSIS — M5416 Radiculopathy, lumbar region: Secondary | ICD-10-CM

## 2020-10-16 DIAGNOSIS — G8929 Other chronic pain: Secondary | ICD-10-CM

## 2020-10-16 NOTE — Therapy (Signed)
[facility] [address] [place], [place], [address] Phone: [phone]   Fax:  [phone]  Physical Therapy Treatment  Patient Details  Name: [name] J Guinea-Bissau Jr. MRN: [id number] Date of Birth: [birth year] Referring Provider (PT): Mechele Claude MD   Encounter Date: 10/16/2020   PT End of Session - 10/16/20 0940     Visit Number 5    Number of Visits 16    Date for PT Re-Evaluation 11/28/20    Authorization Type [employer]    PT Start Time 0900    PT Stop Time 0953    PT Time Calculation (min) 53 min    Activity Tolerance Patient tolerated treatment well    Behavior During Therapy [facility] for tasks assessed/performed             Past Medical History:  Diagnosis Date   Gout    Hypertension    Medical history non-contributory    Wears glasses     Past Surgical History:  Procedure Laterality Date   COLONOSCOPY WITH PROPOFOL N/A 08/02/2019   Procedure: COLONOSCOPY WITH PROPOFOL;  Surgeon: Corbin Ade, MD;  [facility];  Service: Endoscopy;  Laterality: N/A;  9:15am   KNEE ARTHROSCOPY WITH PATELLAR TENDON REPAIR Left 09/03/2012   Procedure: LEFT KNEE ARTHROSCOPY, LOOSE BODY EXCISION, PATELLAR TENDON REPAIR, EXCISION TIBIAL TUBERCLE SPUR, CHONDROPLASTY PATELLA, EXCISION PLICA;  Surgeon: Loreta Ave, MD;  Location: [facility];  Service: Orthopedics;  Laterality: Left;   WISDOM TOOTH EXTRACTION      There were no vitals filed for this visit.   Subjective Assessment - 10/16/20 0907     Subjective Covid-19 screening performed upon arrival. Patient arrived doing some improved per reported.    Pertinent History HTN, sleep apnea, arthroscope left knee June 2014    How long can you sit comfortably? 60 min    How long can you stand comfortably? 10 min    Diagnostic tests xrays - severe degeneration left knee; MRI disc protrusion L3/4 (2021)    Patient Stated Goals pain management for knees and back     Currently in Pain? Yes    Pain Score 4     Pain Location Back    Pain Orientation Left    Pain Descriptors / Indicators Discomfort;Tightness    Pain Type Chronic pain    Pain Onset More than a month ago    Pain Frequency Constant    Aggravating Factors  prolong positions    Pain Relieving Factors movement                               [facility] Adult PT Treatment/Exercise - 10/16/20 0001       Lumbar Exercises: Stretches   Single Knee to Chest Stretch Right;Left;2 reps;20 seconds    Lower Trunk Rotation 4 reps;10 seconds    Hip Flexor Stretch Right;Left;2 reps;30 seconds   standing at wall for support     Lumbar Exercises: Supine   Bridge with clamshell 20 reps;3 seconds   green band   Other Supine Lumbar Exercises ball squeeze 10sec holds x20reps      Knee/Hip Exercises: Aerobic   Nustep L4 x39min UE/LE activity      Knee/Hip Exercises: Machines for Strengthening   Cybex Knee Extension 10# 3x10 reps    Cybex Knee Flexion 20# 3x10 reps      Knee/Hip Exercises: Standing   Other Standing Knee Exercises  lat pull and diagnols with blue XTS x20 reps each      Knee/Hip Exercises: Sidelying   Hip ABduction Strengthening;Both;20 reps      Moist Heat Therapy   Number Minutes Moist Heat 10 Minutes    Moist Heat Location Lumbar Spine      Electrical Stimulation   Electrical Stimulation Location bil lumbar    Electrical Stimulation Action IFC    Electrical Stimulation Parameters 80-150hx    Electrical Stimulation Goals Pain;Tone                         PT Long Term Goals - 10/16/20 0910       PT LONG TERM GOAL #1   Title Pt to be I in advance HEP to be able to decrease low back pain to no greater than a 2/10 .    Baseline pain ranging 4-5/10 10/16/20    Time 8    Period Weeks    Status On-going      PT LONG TERM GOAL #2   Title PT to be able to sit, stand or walk for over 30 mintues at a time to improve his functional ability for  yard and housework.    Baseline limited standing time 8/8/22sleeping    Time 8    Period Weeks    Status On-going      PT LONG TERM GOAL #3   Title PT LE strength to be at least 4+ /5 to increase the ease of functional activites and to allow for better body mechanics to protect his back.    Time 8    Period Weeks    Status On-going      PT LONG TERM GOAL #4   Title Patient able to sleep at least 6 hours without waking from back pain.    Baseline sleeping 2-3 hours 10/16/20    Time 8    Period Weeks    Status On-going      PT LONG TERM GOAL #5   Title Improved bil knee flexion to 115 deg to improve ability to functionally squat with ADLS.    Time 8    Period Weeks    Status On-going                   Plan - 10/16/20 0934     Clinical Impression Statement Patient tolerated treatment well today although pain ongoing in bil low back SIJ area. Patient is limited with walking and sleeping at this time. Today continued to focus on LE strength , abdominal bracing, hip mobility/stretches to improve mobility and strength. Patient current goals ongoing due to pain limitations.    Personal Factors and Comorbidities Comorbidity 2    Comorbidities low back pain with sciatica; bil knee OA    Stability/Clinical Decision Making Evolving/Moderate complexity    Rehab Potential Good    PT Frequency 2x / week    PT Duration 8 weeks    PT Treatment/Interventions ADLs/Self Care Home Management;Cryotherapy;Electrical Stimulation;Moist Heat;Traction;Neuromuscular re-education;Therapeutic exercise;Therapeutic activities;Patient/family education;Manual techniques;Dry needling;Taping;Spinal Manipulations;Joint Manipulations    PT Next Visit Plan Back: work on [employer] and flexibility; try traction; extension biased TEwhen tolerated, body mechanics/ADLS; Knees: manual to ITB, flexibility and functional strength (painfree as possible)    Consulted and Agree with Plan of Care Patient              Patient will benefit from skilled therapeutic intervention in order to improve the following deficits  and impairments:  Decreased range of motion, Increased muscle spasms, Pain, Impaired flexibility, Decreased strength  Visit Diagnosis: Radiculopathy, lumbar region  Muscle weakness (generalized)  Chronic pain of left knee  Chronic pain of right knee     Problem List Patient Active Problem List   Diagnosis Date Noted   Protrusion of lumbar intervertebral disc 04/13/2020   Family history of colon cancer 05/12/2019   Other intervertebral disc degeneration, lumbar region 05/06/2019   Essential hypertension 09/01/2018   Insomnia 10/23/2015   Obstructive sleep apnea 01/16/2015   Gout 01/05/2015    Zunaira Lamy P, PTA 10/16/2020, 10:20 AM  [facility] [address] [place], [place], [address] Phone: [phone]   Fax:  [phone]  Name: [name] J Guinea-Bissau Jr. MRN: [id number] Date of Birth: 1965/04/07

## 2020-10-18 ENCOUNTER — Other Ambulatory Visit: Payer: Self-pay | Admitting: Family Medicine

## 2020-10-18 ENCOUNTER — Other Ambulatory Visit: Payer: Self-pay

## 2020-10-18 ENCOUNTER — Ambulatory Visit: Admitting: Physical Therapy

## 2020-10-18 DIAGNOSIS — G8929 Other chronic pain: Secondary | ICD-10-CM

## 2020-10-18 DIAGNOSIS — M5416 Radiculopathy, lumbar region: Secondary | ICD-10-CM

## 2020-10-18 DIAGNOSIS — M6281 Muscle weakness (generalized): Secondary | ICD-10-CM

## 2020-10-18 DIAGNOSIS — N183 Chronic kidney disease, stage 3 unspecified: Secondary | ICD-10-CM

## 2020-10-18 NOTE — Therapy (Signed)
[facility] [place], [place], [address] Phone: [phone]   Fax:  [phone]  Physical Therapy Treatment  Patient Details  Name: [name] J Iran Jr. MRN: [id number] Date of Birth: April 07, 1965 Referring Provider (PT): Claretta Fraise MD   Encounter Date: 10/18/2020   PT End of Session - 10/18/20 0945     Visit Number 6    Number of Visits 16    Date for PT Re-Evaluation 11/28/20    Authorization Type [employer]    PT Start Time [phone]    PT Stop Time 1024    PT Time Calculation (min) 42 min    Activity Tolerance Patient tolerated treatment well    Behavior During Therapy [facility] for tasks assessed/performed             Past Medical History:  Diagnosis Date   Gout    Hypertension    Medical history non-contributory    Wears glasses     Past Surgical History:  Procedure Laterality Date   COLONOSCOPY WITH PROPOFOL N/A 08/02/2019   Procedure: COLONOSCOPY WITH PROPOFOL;  Surgeon: Daneil Dolin, MD;  [facility];  Service: Endoscopy;  Laterality: N/A;  9:15am   KNEE ARTHROSCOPY WITH PATELLAR TENDON REPAIR Left 09/03/2012   Procedure: LEFT KNEE ARTHROSCOPY, LOOSE BODY EXCISION, PATELLAR TENDON REPAIR, EXCISION TIBIAL TUBERCLE SPUR, CHONDROPLASTY PATELLA, EXCISION PLICA;  Surgeon: Ninetta Lights, MD;  Location: [place];  Service: Orthopedics;  Laterality: Left;   WISDOM TOOTH EXTRACTION      There were no vitals filed for this visit.   Subjective Assessment - 10/18/20 0943     Subjective Covid-19 screening performed upon arrival. Patient reported some "better" today.    Pertinent History HTN, sleep apnea, arthroscope left knee June 2014    How long can you sit comfortably? 60 min    How long can you stand comfortably? 10 min    Diagnostic tests xrays - severe degeneration left knee; MRI disc protrusion L3/4 (2021)    Patient Stated Goals pain management for knees and back    Currently in  Pain? Yes    Pain Score 4     Pain Location Back    Pain Orientation Left    Pain Descriptors / Indicators Discomfort;Tightness    Pain Type Chronic pain    Pain Onset More than a month ago    Pain Frequency Intermittent    Aggravating Factors  prolong positions or certain movements    Pain Relieving Factors movement                               [facility] Adult PT Treatment/Exercise - 10/18/20 0001       Lumbar Exercises: Stretches   Lower Trunk Rotation 4 reps;10 seconds    Piriformis Stretch 3 reps;Right;Left;30 seconds      Lumbar Exercises: Supine   Bridge with clamshell 20 reps;3 seconds   with green band     Knee/Hip Exercises: Aerobic   Nustep L4 x71mn UE/LE activity      Knee/Hip Exercises: Machines for Strengthening   Cybex Knee Extension 10# 3x10 reps    Cybex Knee Flexion 20# 3x10 reps      Knee/Hip Exercises: Standing   Other Standing Knee Exercises lat pull and diagnols with blue XTS x20 reps each      Moist Heat Therapy   Number Minutes Moist Heat 10 Minutes  Moist Heat Location Lumbar Spine      Electrical Stimulation   Electrical Stimulation Location bil lumbar    Electrical Stimulation Action IFC    Electrical Stimulation Parameters 80-150hz x35mn    Electrical Stimulation Goals Pain;Tone                    PT Education - 10/18/20 0956     Education Details HEP progression    Person(s) Educated Patient    Methods Explanation;Demonstration;Handout    Comprehension Verbalized understanding;Returned demonstration                 PT Long Term Goals - 10/18/20 0959       PT LONG TERM GOAL #1   Title Pt to be I in advance HEP to be able to decrease low back pain to no greater than a 2/10 .    Baseline HEP issued yet pain ranging 4-5/10 10/18/20    Time 8    Period Weeks    Status Partially Met      PT LONG TERM GOAL #2   Title PT to be able to sit, stand or walk for over 30 mintues at a time to improve  his functional ability for yard and housework.    Baseline limited standing time 10/18/20    Time 8    Period Weeks    Status On-going      PT LONG TERM GOAL #3   Title PT LE strength to be at least 4+ /5 to increase the ease of functional activites and to allow for better body mechanics to protect his back.    Time 8    Period Weeks    Status On-going      PT LONG TERM GOAL #4   Title Patient able to sleep at least 6 hours without waking from back pain.    Baseline sleeping 2-3 hours 10/18/20    Time 8    Period Weeks    Status On-going      PT LONG TERM GOAL #5   Title Improved bil knee flexion to 115 deg to improve ability to functionally squat with ADLS.    Time 8    Period Weeks    Status On-going                   Plan - 10/18/20 1002     Clinical Impression Statement Patient tolerated treatment well today. patient progressing with exericses today for low back and LE to improve strength. Patient was issued HEP for home progression. Patient continues to have limited sleep and up to 5-6/10 pain at times. Goals progresisng.    Personal Factors and Comorbidities Comorbidity 2    Comorbidities low back pain with sciatica; bil knee OA    Stability/Clinical Decision Making Evolving/Moderate complexity    Rehab Potential Good    PT Frequency 2x / week    PT Duration 8 weeks    PT Treatment/Interventions ADLs/Self Care Home Management;Cryotherapy;Electrical Stimulation;Moist Heat;Traction;Neuromuscular re-education;Therapeutic exercise;Therapeutic activities;Patient/family education;Manual techniques;Dry needling;Taping;Spinal Manipulations;Joint Manipulations    PT Next Visit Plan Back: work on [employer] flexibility; try traction; extension biased TEwhen tolerated, body mechanics/ADLS; Knees: manual to ITB, flexibility and functional strength (painfree as possible)    Consulted and Agree with Plan of Care Patient             Patient will benefit from skilled  therapeutic intervention in order to improve the following deficits and impairments:  Decreased range of  motion, Increased muscle spasms, Pain, Impaired flexibility, Decreased strength  Visit Diagnosis: Radiculopathy, lumbar region  Muscle weakness (generalized)  Chronic pain of left knee  Chronic pain of right knee     Problem List Patient Active Problem List   Diagnosis Date Noted   Protrusion of lumbar intervertebral disc 04/13/2020   Family history of colon cancer 05/12/2019   Other intervertebral disc degeneration, lumbar region 05/06/2019   Essential hypertension 09/01/2018   Insomnia 10/23/2015   Obstructive sleep apnea 01/16/2015   Gout 01/05/2015    DUNFORD, CHRISTINA P, PTA 10/18/2020, 10:26 AM  [facility] [address] [place], [place], [address] Phone: [phone]   Fax:  [phone]  Name: [name] J Iran Jr. MRN: [id number] Date of Birth: Sep 28, 1965

## 2020-10-18 NOTE — Patient Instructions (Signed)
Scapular Retraction: Bilateral    Standing lat pull with theraband  Anchor bands higher than your head. Start with your arms straight out in front of you at shoulder height (or a little above).  Pull bands down next to your body and then slowly return to the starting position.  10-30 x1day    Bridging with Theraband  Begin by lying on your back with your knees bent and theraband around your knees. Tighten your abs by tilting your pelvis up and flattening your back on the mat. Squeeze your glutes and raise your hips off of the table towards the ceiling. Keeping your hips raised, pull your knees outward against the band. Relax your knees back to midline and slowly return your hips to the mat. Relax and Breathe 2x10 1x daily   Strengthening: Hip Abduction (Side-Lying)   Tighten muscles on front of left thigh, then lift leg __5__ inches from surface, keeping knee locked.  Repeat __10__ times per set. Do __2__ sets per session. Do _2___ sessions per day.

## 2020-10-23 ENCOUNTER — Other Ambulatory Visit: Payer: Self-pay

## 2020-10-23 ENCOUNTER — Ambulatory Visit: Admitting: Physical Therapy

## 2020-10-23 DIAGNOSIS — M5416 Radiculopathy, lumbar region: Secondary | ICD-10-CM

## 2020-10-23 DIAGNOSIS — G8929 Other chronic pain: Secondary | ICD-10-CM

## 2020-10-23 DIAGNOSIS — M6281 Muscle weakness (generalized): Secondary | ICD-10-CM

## 2020-10-23 DIAGNOSIS — M25561 Pain in right knee: Secondary | ICD-10-CM

## 2020-10-23 NOTE — Therapy (Signed)
[facility] [place], [place], [address] Phone: [phone]   Fax:  [phone]  Physical Therapy Treatment  Patient Details  Name: [name] J Iran Jr. MRN: [id number] Date of Birth: 1965-12-06 Referring Provider (PT): Claretta Fraise MD   Encounter Date: 10/23/2020   PT End of Session - 10/23/20 0954     Visit Number 7    Number of Visits 16    Date for PT Re-Evaluation 11/28/20    Authorization Type [employer]    PT Start Time 0951    PT Stop Time 1039    PT Time Calculation (min) 48 min    Activity Tolerance Patient tolerated treatment well;Patient limited by pain    Behavior During Therapy [facility] for tasks assessed/performed             Past Medical History:  Diagnosis Date   Gout    Hypertension    Medical history non-contributory    Wears glasses     Past Surgical History:  Procedure Laterality Date   COLONOSCOPY WITH PROPOFOL N/A 08/02/2019   Procedure: COLONOSCOPY WITH PROPOFOL;  Surgeon: Daneil Dolin, MD;  [facility];  Service: Endoscopy;  Laterality: N/A;  9:15am   KNEE ARTHROSCOPY WITH PATELLAR TENDON REPAIR Left 09/03/2012   Procedure: LEFT KNEE ARTHROSCOPY, LOOSE BODY EXCISION, PATELLAR TENDON REPAIR, EXCISION TIBIAL TUBERCLE SPUR, CHONDROPLASTY PATELLA, EXCISION PLICA;  Surgeon: Ninetta Lights, MD;  Location: [place];  Service: Orthopedics;  Laterality: Left;   WISDOM TOOTH EXTRACTION      There were no vitals filed for this visit.   Subjective Assessment - 10/23/20 0953     Subjective Covid-19 screening performed upon arrival. Patient arrived with increased pain after stepping wrong on saturday.    Pertinent History HTN, sleep apnea, arthroscope left knee June 2014    How long can you sit comfortably? 60 min    How long can you stand comfortably? 10 min    Diagnostic tests xrays - severe degeneration left knee; MRI disc protrusion L3/4 (2021)    Patient Stated Goals  pain management for knees and back    Currently in Pain? Yes    Pain Score 6     Pain Location Back    Pain Orientation Right;Left    Pain Descriptors / Indicators Discomfort    Pain Type Chronic pain    Pain Onset More than a month ago    Pain Frequency Intermittent    Aggravating Factors  certain movements    Pain Relieving Factors rest                               [facility] Adult PT Treatment/Exercise - 10/23/20 0001       Lumbar Exercises: Stretches   Hip Flexor Stretch Right;Left;2 reps;30 seconds      Knee/Hip Exercises: Stretches   Piriformis Stretch Both;2 reps;30 seconds      Knee/Hip Exercises: Aerobic   Nustep L4 x9mn UE/LE activity      Moist Heat Therapy   Number Minutes Moist Heat 15 Minutes    Moist Heat Location Lumbar Spine      Electrical Stimulation   Electrical Stimulation Location bil lumbar    Electrical Stimulation Action IFC    Electrical Stimulation Parameters 80-150hz x15 min    Electrical Stimulation Goals Pain;Tone      Manual Therapy   Manual Therapy Soft tissue mobilization  Soft tissue mobilization manual STW to bil SIJ and low back to reduce pain and tone                         PT Long Term Goals - 10/18/20 0959       PT LONG TERM GOAL #1   Title Pt to be I in advance HEP to be able to decrease low back pain to no greater than a 2/10 .    Baseline HEP issued yet pain ranging 4-5/10 10/18/20    Time 8    Period Weeks    Status Partially Met      PT LONG TERM GOAL #2   Title PT to be able to sit, stand or walk for over 30 mintues at a time to improve his functional ability for yard and housework.    Baseline limited standing time 10/18/20    Time 8    Period Weeks    Status On-going      PT LONG TERM GOAL #3   Title PT LE strength to be at least 4+ /5 to increase the ease of functional activites and to allow for better body mechanics to protect his back.    Time 8    Period Weeks    Status  On-going      PT LONG TERM GOAL #4   Title Patient able to sleep at least 6 hours without waking from back pain.    Baseline sleeping 2-3 hours 10/18/20    Time 8    Period Weeks    Status On-going      PT LONG TERM GOAL #5   Title Improved bil knee flexion to 115 deg to improve ability to functionally squat with ADLS.    Time 8    Period Weeks    Status On-going                   Plan - 10/23/20 0955     Clinical Impression Statement Patient arrived with increased pain today. Patient reported more pain on bil low back SI area today. Today low level treatment to reduce pain. Focused on genlte warm up stretching, STW and modalities. Goals progressing.    Personal Factors and Comorbidities Comorbidity 2    Comorbidities low back pain with sciatica; bil knee OA    Stability/Clinical Decision Making Evolving/Moderate complexity    Rehab Potential Good    PT Frequency 2x / week    PT Duration 8 weeks    PT Treatment/Interventions ADLs/Self Care Home Management;Cryotherapy;Electrical Stimulation;Moist Heat;Traction;Neuromuscular re-education;Therapeutic exercise;Therapeutic activities;Patient/family education;Manual techniques;Dry needling;Taping;Spinal Manipulations;Joint Manipulations    PT Next Visit Plan Back: work on [employer] and flexibility; try traction; extension biased TEwhen tolerated, body mechanics/ADLS; Knees: manual to ITB, flexibility and functional strength (painfree as possible)    Consulted and Agree with Plan of Care Patient             Patient will benefit from skilled therapeutic intervention in order to improve the following deficits and impairments:  Decreased range of motion, Increased muscle spasms, Pain, Impaired flexibility, Decreased strength  Visit Diagnosis: Radiculopathy, lumbar region  Muscle weakness (generalized)  Chronic pain of left knee  Chronic pain of right knee     Problem List Patient Active Problem List   Diagnosis Date  Noted   Protrusion of lumbar intervertebral disc 04/13/2020   Family history of colon cancer 05/12/2019   Other intervertebral disc degeneration, lumbar region 05/06/2019  Essential hypertension 09/01/2018   Insomnia 10/23/2015   Obstructive sleep apnea 01/16/2015   Gout 01/05/2015    Matt Delpizzo P, PTA 10/23/2020, 10:40 AM  [facility] [place], [place], [address] Phone: [phone]   Fax:  [phone]  Name: [name] J Iran Jr. MRN: [id number] Date of Birth: Nov 15, 1965

## 2020-10-26 ENCOUNTER — Ambulatory Visit: Admitting: Physical Therapy

## 2020-10-26 ENCOUNTER — Other Ambulatory Visit: Payer: Self-pay

## 2020-10-26 DIAGNOSIS — M5416 Radiculopathy, lumbar region: Secondary | ICD-10-CM | POA: Diagnosis not present

## 2020-10-26 DIAGNOSIS — G8929 Other chronic pain: Secondary | ICD-10-CM

## 2020-10-26 DIAGNOSIS — M6281 Muscle weakness (generalized): Secondary | ICD-10-CM

## 2020-10-26 DIAGNOSIS — M25561 Pain in right knee: Secondary | ICD-10-CM

## 2020-10-26 DIAGNOSIS — M25562 Pain in left knee: Secondary | ICD-10-CM

## 2020-10-26 NOTE — Therapy (Signed)
[facility] [place], [place], [address] Phone: [phone]   Fax:  [phone]  Physical Therapy Treatment  Patient Details  Name: [name] J Iran Jr. MRN: [id number] Date of Birth: [birth year] Referring Provider (PT): Claretta Fraise MD   Encounter Date: 10/26/2020   PT End of Session - 10/26/20 0911     Visit Number 8    Number of Visits 16    Date for PT Re-Evaluation 11/28/20    Authorization Type [employer]    PT Start Time 0907    PT Stop Time 0951    PT Time Calculation (min) 44 min    Activity Tolerance Patient tolerated treatment well    Behavior During Therapy [facility] for tasks assessed/performed             Past Medical History:  Diagnosis Date   Gout    Hypertension    Medical history non-contributory    Wears glasses     Past Surgical History:  Procedure Laterality Date   COLONOSCOPY WITH PROPOFOL N/A 08/02/2019   Procedure: COLONOSCOPY WITH PROPOFOL;  Surgeon: Daneil Dolin, MD;  [facility];  Service: Endoscopy;  Laterality: N/A;  9:15am   KNEE ARTHROSCOPY WITH PATELLAR TENDON REPAIR Left 09/03/2012   Procedure: LEFT KNEE ARTHROSCOPY, LOOSE BODY EXCISION, PATELLAR TENDON REPAIR, EXCISION TIBIAL TUBERCLE SPUR, CHONDROPLASTY PATELLA, EXCISION PLICA;  Surgeon: Ninetta Lights, MD;  Location: [place];  Service: Orthopedics;  Laterality: Left;   WISDOM TOOTH EXTRACTION      There were no vitals filed for this visit.   Subjective Assessment - 10/26/20 0909     Subjective Covid-19 screening performed upon arrival. Patient reported feeling improvement today. Stiff today yet less pain unless a wrong movement.    Pertinent History HTN, sleep apnea, arthroscope left knee June 2014    How long can you sit comfortably? 60 min    How long can you stand comfortably? 10 min    Diagnostic tests xrays - severe degeneration left knee; MRI disc protrusion L3/4 (2021)    Patient Stated Goals  pain management for knees and back    Currently in Pain? Yes    Pain Score 4     Pain Location Back    Pain Orientation Right;Left    Pain Descriptors / Indicators Tightness;Discomfort;Sore    Pain Type Chronic pain    Pain Onset More than a month ago    Pain Frequency Intermittent    Aggravating Factors  certain movements    Pain Relieving Factors at rest                [facility] PT Assessment - 10/26/20 0001       ROM / Strength   AROM / PROM / Strength AROM      AROM   AROM Assessment Site Knee    Right/Left Knee Right;Left    Right Knee Flexion 115    Left Knee Flexion 115                           [facility] Adult PT Treatment/Exercise - 10/26/20 0001       Lumbar Exercises: Supine   Clam 20 reps;3 seconds   with abdominal bracing   Bent Knee Raise 3 seconds   2x10 w abdominal bracing   Bridge 15 reps    Straight Leg Raise 2 seconds   2x10   Other Supine Lumbar Exercises ball  squeeze 10sec holds x20reps      Knee/Hip Exercises: Stretches   Piriformis Stretch Both;2 reps;30 seconds      Knee/Hip Exercises: Aerobic   Nustep L3 x46mn UE/LE activity      Knee/Hip Exercises: Standing   Other Standing Knee Exercises latt pull with blue XTS with holds x 20 w abdominal bracing      Moist Heat Therapy   Number Minutes Moist Heat 15 Minutes    Moist Heat Location Lumbar Spine      Electrical Stimulation   Electrical Stimulation Location bil lumbar    Electrical Stimulation Action IFC    Electrical Stimulation Parameters 80-150hz x154m    Electrical Stimulation Goals Pain;Tone                         PT Long Term Goals - 10/26/20 0912       PT LONG TERM GOAL #1   Title Pt to be I in advance HEP to be able to decrease low back pain to no greater than a 2/10 .    Baseline HEP issued yet pain ranging 4/10 10/26/20    Time 8    Period Weeks    Status Partially Met      PT LONG TERM GOAL #2   Title PT to be able to sit, stand or  walk for over 30 mintues at a time to improve his functional ability for yard and housework.    Baseline limited standing time 10/26/20    Time 8    Period Weeks    Status On-going      PT LONG TERM GOAL #3   Title PT LE strength to be at least 4+ /5 to increase the ease of functional activites and to allow for better body mechanics to protect his back.    Time 8    Period Weeks    Status On-going      PT LONG TERM GOAL #4   Title Patient able to sleep at least 6 hours without waking from back pain.    Baseline sleeping 2-3 hours 10/26/20    Time 8    Period Weeks    Status On-going      PT LONG TERM GOAL #5   Title Improved bil knee flexion to 115 deg to improve ability to functionally squat with ADLS.    Baseline Bil knee 115 degrees flexion 10/26/20    Time 8    Period Weeks    Status Achieved                   Plan - 10/26/20 0914     Clinical Impression Statement Patient tolerated treatment well today. Patient reported less pain today. Today focused on gentle core progression due to recent flare up in low back. Patient continues to have pain at 4/10 and limited with sleeping and ADL's due to pain. Patient AROM in bil knee is 115 degrees and met goal with other goals progressing.    Personal Factors and Comorbidities Comorbidity 2    Comorbidities low back pain with sciatica; bil knee OA    Stability/Clinical Decision Making Evolving/Moderate complexity    Rehab Potential Good    PT Frequency 2x / week    PT Duration 8 weeks    PT Treatment/Interventions ADLs/Self Care Home Management;Cryotherapy;Electrical Stimulation;Moist Heat;Traction;Neuromuscular re-education;Therapeutic exercise;Therapeutic activities;Patient/family education;Manual techniques;Dry needling;Taping;Spinal Manipulations;Joint Manipulations    PT Next Visit Plan Back: work on [employer]nd flexibility; try  traction; extension biased TEwhen tolerated, body mechanics/ADLS; Knees: manual to ITB,  flexibility and functional strength (painfree as possible)    Consulted and Agree with Plan of Care Patient             Patient will benefit from skilled therapeutic intervention in order to improve the following deficits and impairments:  Decreased range of motion, Increased muscle spasms, Pain, Impaired flexibility, Decreased strength  Visit Diagnosis: Radiculopathy, lumbar region  Muscle weakness (generalized)  Chronic pain of left knee  Chronic pain of right knee     Problem List Patient Active Problem List   Diagnosis Date Noted   Protrusion of lumbar intervertebral disc 04/13/2020   Family history of colon cancer 05/12/2019   Other intervertebral disc degeneration, lumbar region 05/06/2019   Essential hypertension 09/01/2018   Insomnia 10/23/2015   Obstructive sleep apnea 01/16/2015   Gout 01/05/2015    DUNFORD, CHRISTINA P, PTA 10/26/2020, 9:56 AM  [facility] [address] [place], [place], [address] Phone: [phone]   Fax:  [phone]  Name: [name] J Iran Jr. MRN: [id number] Date of Birth: Jan 21, 1966

## 2020-10-31 ENCOUNTER — Other Ambulatory Visit: Payer: Self-pay

## 2020-10-31 ENCOUNTER — Encounter: Payer: Self-pay | Admitting: Physical Therapy

## 2020-10-31 ENCOUNTER — Ambulatory Visit: Admitting: Physical Therapy

## 2020-10-31 DIAGNOSIS — M5416 Radiculopathy, lumbar region: Secondary | ICD-10-CM | POA: Diagnosis not present

## 2020-10-31 DIAGNOSIS — M6281 Muscle weakness (generalized): Secondary | ICD-10-CM

## 2020-10-31 NOTE — Therapy (Signed)
[facility] [place], [place], [address] Phone: [phone]   Fax:  [phone]  Physical Therapy Treatment  Patient Details  Name: [name] J Iran Jr. MRN: [id number] Date of Birth: 09-Dec-1965 Referring Provider (PT): Claretta Fraise MD   Encounter Date: 10/31/2020   PT End of Session - 10/31/20 0911     Visit Number 9    Number of Visits 16    Date for PT Re-Evaluation 11/28/20    Authorization Type [employer]    PT Start Time 0909    PT Stop Time 0953    PT Time Calculation (min) 44 min    Activity Tolerance Patient tolerated treatment well    Behavior During Therapy [facility] for tasks assessed/performed             Past Medical History:  Diagnosis Date   Gout    Hypertension    Medical history non-contributory    Wears glasses     Past Surgical History:  Procedure Laterality Date   COLONOSCOPY WITH PROPOFOL N/A 08/02/2019   Procedure: COLONOSCOPY WITH PROPOFOL;  Surgeon: Daneil Dolin, MD;  [facility];  Service: Endoscopy;  Laterality: N/A;  9:15am   KNEE ARTHROSCOPY WITH PATELLAR TENDON REPAIR Left 09/03/2012   Procedure: LEFT KNEE ARTHROSCOPY, LOOSE BODY EXCISION, PATELLAR TENDON REPAIR, EXCISION TIBIAL TUBERCLE SPUR, CHONDROPLASTY PATELLA, EXCISION PLICA;  Surgeon: Ninetta Lights, MD;  Location: [place];  Service: Orthopedics;  Laterality: Left;   WISDOM TOOTH EXTRACTION      There were no vitals filed for this visit.   Subjective Assessment - 10/31/20 0908     Subjective Covid-19 screening performed upon arrival. Reports a muscle spasm just below the R shoulder blade area.    Pertinent History HTN, sleep apnea, arthroscope left knee June 2014    How long can you sit comfortably? 60 min    How long can you stand comfortably? 10 min    Diagnostic tests xrays - severe degeneration left knee; MRI disc protrusion L3/4 (2021)    Patient Stated Goals pain management for knees and  back    Currently in Pain? Yes    Pain Score 4     Pain Location Shoulder    Pain Orientation Right    Pain Descriptors / Indicators Discomfort;Spasm    Pain Type Chronic pain    Pain Onset More than a month ago    Pain Frequency Intermittent                [facility] PT Assessment - 10/31/20 0001       Assessment   Medical Diagnosis sciatica of left side; arthritis of knees bil    Referring Provider (PT) Claretta Fraise MD    Onset Date/Surgical Date 09/19/20    Next MD Visit None      Precautions   Precautions None                           [facility] Adult PT Treatment/Exercise - 10/31/20 0001       Lumbar Exercises: Stretches   Lower Trunk Rotation 3 reps;10 seconds    Other Lumbar Stretch Exercise Forward sink stretch, L sink stretch 3x10 sec      Lumbar Exercises: Aerobic   Nustep L4 x10 min      Lumbar Exercises: Supine   Bent Knee Raise 10 reps;3 seconds    Bridge 15 reps;3 seconds  Modalities   Modalities Electrical Stimulation;Moist Heat      Moist Heat Therapy   Number Minutes Moist Heat 15 Minutes    Moist Heat Location Lumbar Spine      Electrical Stimulation   Electrical Stimulation Location bil lumbar    Electrical Stimulation Action IFC    Electrical Stimulation Parameters 80-150 hz x15 min    Electrical Stimulation Goals Pain;Tone                         PT Long Term Goals - 10/26/20 0912       PT LONG TERM GOAL #1   Title Pt to be I in advance HEP to be able to decrease low back pain to no greater than a 2/10 .    Baseline HEP issued yet pain ranging 4/10 10/26/20    Time 8    Period Weeks    Status Partially Met      PT LONG TERM GOAL #2   Title PT to be able to sit, stand or walk for over 30 mintues at a time to improve his functional ability for yard and housework.    Baseline limited standing time 10/26/20    Time 8    Period Weeks    Status On-going      PT LONG TERM GOAL #3   Title PT LE  strength to be at least 4+ /5 to increase the ease of functional activites and to allow for better body mechanics to protect his back.    Time 8    Period Weeks    Status On-going      PT LONG TERM GOAL #4   Title Patient able to sleep at least 6 hours without waking from back pain.    Baseline sleeping 2-3 hours 10/26/20    Time 8    Period Weeks    Status On-going      PT LONG TERM GOAL #5   Title Improved bil knee flexion to 115 deg to improve ability to functionally squat with ADLS.    Baseline Bil knee 115 degrees flexion 10/26/20    Time 8    Period Weeks    Status Achieved                   Plan - 10/31/20 1014     Clinical Impression Statement Patient presented in clinic with reports of muscle spasms of R latissimus dorsi and LBP. Patient progressed through light stretching but unable to find some relief via therex session. Patient slow with transitions and transfers at this time. Normal modalities response noted following removal of the modalities.    Personal Factors and Comorbidities Comorbidity 2    Comorbidities low back pain with sciatica; bil knee OA    Stability/Clinical Decision Making Evolving/Moderate complexity    Rehab Potential Good    PT Frequency 2x / week    PT Duration 8 weeks    PT Treatment/Interventions ADLs/Self Care Home Management;Cryotherapy;Electrical Stimulation;Moist Heat;Traction;Neuromuscular re-education;Therapeutic exercise;Therapeutic activities;Patient/family education;Manual techniques;Dry needling;Taping;Spinal Manipulations;Joint Manipulations    PT Next Visit Plan Back: work on [employer] and flexibility; try traction; extension biased TEwhen tolerated, body mechanics/ADLS; Knees: manual to ITB, flexibility and functional strength (painfree as possible)    PT Home Exercise Plan [id number]: bridge, LTR, supine nerve glide, seated fig 4; positioning right SDLY with upper trunk rotation    Consulted and Agree with Plan of Care Patient  Patient will benefit from skilled therapeutic intervention in order to improve the following deficits and impairments:  Decreased range of motion, Increased muscle spasms, Pain, Impaired flexibility, Decreased strength  Visit Diagnosis: Radiculopathy, lumbar region  Muscle weakness (generalized)     Problem List Patient Active Problem List   Diagnosis Date Noted   Protrusion of lumbar intervertebral disc 04/13/2020   Family history of colon cancer 05/12/2019   Other intervertebral disc degeneration, lumbar region 05/06/2019   Essential hypertension 09/01/2018   Insomnia 10/23/2015   Obstructive sleep apnea 01/16/2015   Gout 01/05/2015    Standley Brooking, PTA 10/31/2020, 10:30 AM  [facility] [address] [place], [place], [address] Phone: [phone]   Fax:  [phone]  Name: [name] J Iran Jr. MRN: [id number] Date of Birth: 09/26/1965

## 2020-11-02 ENCOUNTER — Ambulatory Visit: Admitting: Physical Therapy

## 2020-11-02 ENCOUNTER — Other Ambulatory Visit: Payer: Self-pay

## 2020-11-02 DIAGNOSIS — M5416 Radiculopathy, lumbar region: Secondary | ICD-10-CM

## 2020-11-02 DIAGNOSIS — G8929 Other chronic pain: Secondary | ICD-10-CM

## 2020-11-02 DIAGNOSIS — M25561 Pain in right knee: Secondary | ICD-10-CM

## 2020-11-02 DIAGNOSIS — M25562 Pain in left knee: Secondary | ICD-10-CM

## 2020-11-02 DIAGNOSIS — M6281 Muscle weakness (generalized): Secondary | ICD-10-CM

## 2020-11-02 NOTE — Therapy (Signed)
[facility] [place], [place], [address] Phone: [phone]   Fax:  [phone]  Physical Therapy Treatment  Patient Details  Name: [name] J Iran Jr. MRN: [id number] Date of Birth: 1965/06/16 Referring Provider (PT): Claretta Fraise MD   Encounter Date: 11/02/2020   PT End of Session - 11/02/20 0914     Visit Number 10    Number of Visits 16    Date for PT Re-Evaluation 11/28/20    Authorization Type [employer]    PT Start Time 0908    PT Stop Time 0957    PT Time Calculation (min) 49 min    Activity Tolerance Patient tolerated treatment well    Behavior During Therapy [facility] for tasks assessed/performed             Past Medical History:  Diagnosis Date   Gout    Hypertension    Medical history non-contributory    Wears glasses     Past Surgical History:  Procedure Laterality Date   COLONOSCOPY WITH PROPOFOL N/A 08/02/2019   Procedure: COLONOSCOPY WITH PROPOFOL;  Surgeon: Daneil Dolin, MD;  [facility];  Service: Endoscopy;  Laterality: N/A;  9:15am   KNEE ARTHROSCOPY WITH PATELLAR TENDON REPAIR Left 09/03/2012   Procedure: LEFT KNEE ARTHROSCOPY, LOOSE BODY EXCISION, PATELLAR TENDON REPAIR, EXCISION TIBIAL TUBERCLE SPUR, CHONDROPLASTY PATELLA, EXCISION PLICA;  Surgeon: Ninetta Lights, MD;  Location: [place];  Service: Orthopedics;  Laterality: Left;   WISDOM TOOTH EXTRACTION      There were no vitals filed for this visit.   Subjective Assessment - 11/02/20 0911     Subjective Covid-19 screening performed upon arrival. Patient reported less LE symptoms today.    Pertinent History HTN, sleep apnea, arthroscope left knee June 2014    How long can you sit comfortably? 60 min    How long can you stand comfortably? 10 min    Diagnostic tests xrays - severe degeneration left knee; MRI disc protrusion L3/4 (2021)    Patient Stated Goals pain management for knees and back    Currently in  Pain? Yes    Pain Score 5     Pain Location Back    Pain Orientation Lower;Left    Pain Descriptors / Indicators Discomfort    Pain Type Chronic pain    Pain Onset More than a month ago    Pain Frequency Intermittent    Aggravating Factors  certain movements    Pain Relieving Factors at rest                               [facility] Adult PT Treatment/Exercise - 11/02/20 0001       Lumbar Exercises: Supine   Bent Knee Raise 3 seconds   2x10   Bridge 15 reps;3 seconds    Straight Leg Raise 2 seconds   2x10   Other Supine Lumbar Exercises ball squeeze 10sec holds x20reps      Lumbar Exercises: Sidelying   Other Sidelying Lumbar Exercises positional traction for left x75min      Knee/Hip Exercises: Aerobic   Nustep L4x45min UE/LE activity, core focus      Knee/Hip Exercises: Standing   Other Standing Knee Exercises latt pull with blue XTS with holds x 20 w abdominal bracing      Moist Heat Therapy   Number Minutes Moist Heat 15 Minutes    Moist Heat Location  Lumbar Spine      Electrical Stimulation   Electrical Stimulation Location bil lumbar    Electrical Stimulation Action IFC    Electrical Stimulation Parameters 80-_0  x67mn    Electrical Stimulation Goals Pain;Tone                         PT Long Term Goals - 11/02/20 0912       PT LONG TERM GOAL #1   Title Pt to be I in advance HEP to be able to decrease low back pain to no greater than a 2/10 .    Baseline HEP issued yet pain ongoing 11/02/20    Time 8    Period Weeks    Status Partially Met      PT LONG TERM GOAL #2   Title PT to be able to sit, stand or walk for over 30 mintues at a time to improve his functional ability for yard and housework.    Baseline limited standing time 11/02/20    Time 8    Period Weeks    Status On-going      PT LONG TERM GOAL #3   Title PT LE strength to be at least 4+ /5 to increase the ease of functional activites and to allow for better body  mechanics to protect his back.    Time 8    Period Weeks    Status On-going      PT LONG TERM GOAL #4   Title Patient able to sleep at least 6 hours without waking from back pain.    Baseline sleeping 2-3 hours 11/02/20    Time 8    Period Weeks    Status On-going      PT LONG TERM GOAL #5   Title Improved bil knee flexion to 115 deg to improve ability to functionally squat with ADLS.    Baseline Bil knee 115 degrees flexion 10/26/20    Time 8    Period Weeks    Status Achieved                   Plan - 11/02/20 0944     Clinical Impression Statement Patient tolerated treatment well today. Patient continues to have 5/10 pain in low back yet less symptoms in LE. Patient only progressing slowly with ther ex due to ongoing symptoms. Attempted positional traction today for 3 min yet no relief at this time. Patient current goals progressing due to pain limitations.    Personal Factors and Comorbidities Comorbidity 2    Comorbidities low back pain with sciatica; bil knee OA    Stability/Clinical Decision Making Evolving/Moderate complexity    Rehab Potential Good    PT Frequency 2x / week    PT Duration 8 weeks    PT Treatment/Interventions ADLs/Self Care Home Management;Cryotherapy;Electrical Stimulation;Moist Heat;Traction;Neuromuscular re-education;Therapeutic exercise;Therapeutic activities;Patient/family education;Manual techniques;Dry needling;Taping;Spinal Manipulations;Joint Manipulations    PT Next Visit Plan Back: work on [employer] flexibility; try traction; extension biased TEwhen tolerated, body mechanics/ADLS; Knees: manual to ITB, flexibility and functional strength (painfree as possible)    Consulted and Agree with Plan of Care Patient             Patient will benefit from skilled therapeutic intervention in order to improve the following deficits and impairments:  Decreased range of motion, Increased muscle spasms, Pain, Impaired flexibility, Decreased  strength  Visit Diagnosis: Radiculopathy, lumbar region  Muscle weakness (generalized)  Chronic pain of left knee  Chronic pain of right knee     Problem List Patient Active Problem List   Diagnosis Date Noted   Protrusion of lumbar intervertebral disc 04/13/2020   Family history of colon cancer 05/12/2019   Other intervertebral disc degeneration, lumbar region 05/06/2019   Essential hypertension 09/01/2018   Insomnia 10/23/2015   Obstructive sleep apnea 01/16/2015   Gout 01/05/2015    Cendy Oconnor P, PTA 11/02/2020, 10:01 AM  [facility] [place], [place], [address] Phone: [phone]   Fax:  [phone]  Name: [name] J Iran Jr. MRN: [id number] Date of Birth: 02-21-1966

## 2020-11-06 ENCOUNTER — Ambulatory Visit: Admitting: *Deleted

## 2020-11-06 ENCOUNTER — Other Ambulatory Visit: Payer: Self-pay

## 2020-11-06 DIAGNOSIS — M6281 Muscle weakness (generalized): Secondary | ICD-10-CM

## 2020-11-06 DIAGNOSIS — M5416 Radiculopathy, lumbar region: Secondary | ICD-10-CM | POA: Diagnosis not present

## 2020-11-06 NOTE — Therapy (Signed)
[facility] [place], [place], [address] Phone: [phone]   Fax:  [phone]  Physical Therapy Treatment  Patient Details  Name: [name] J Iran Jr. MRN: [id number] Date of Birth: [birth year] Referring Provider (PT): Claretta Fraise MD   Encounter Date: 11/06/2020   PT End of Session - 11/06/20 1655     Visit Number 11    Number of Visits 16    Date for PT Re-Evaluation 11/28/20    Authorization Type [employer]    PT Start Time 0912    PT Stop Time 1000    PT Time Calculation (min) 48 min             Past Medical History:  Diagnosis Date   Gout    Hypertension    Medical history non-contributory    Wears glasses     Past Surgical History:  Procedure Laterality Date   COLONOSCOPY WITH PROPOFOL N/A 08/02/2019   Procedure: COLONOSCOPY WITH PROPOFOL;  Surgeon: Daneil Dolin, MD;  [facility];  Service: Endoscopy;  Laterality: N/A;  9:15am   KNEE ARTHROSCOPY WITH PATELLAR TENDON REPAIR Left 09/03/2012   Procedure: LEFT KNEE ARTHROSCOPY, LOOSE BODY EXCISION, PATELLAR TENDON REPAIR, EXCISION TIBIAL TUBERCLE SPUR, CHONDROPLASTY PATELLA, EXCISION PLICA;  Surgeon: Ninetta Lights, MD;  Location: [facility];  Service: Orthopedics;  Laterality: Left;   WISDOM TOOTH EXTRACTION      There were no vitals filed for this visit.   Subjective Assessment - 11/06/20 1654     Pertinent History HTN, sleep apnea, arthroscope left knee June 2014    How long can you sit comfortably? 60 min    How long can you stand comfortably? 10 min    Diagnostic tests xrays - severe degeneration left knee; MRI disc protrusion L3/4 (2021)    Patient Stated Goals pain management for knees and back    Currently in Pain? Yes    Pain Score 7     Pain Location Back    Pain Orientation Left;Lower    Pain Descriptors / Indicators Discomfort;Shooting    Pain Type Chronic pain    Pain Onset More than a month ago                                [facility] Adult PT Treatment/Exercise - 11/06/20 0001       Therapeutic Activites    Therapeutic Activities ADL's;Lifting    ADL's discussed neutral pos. for pelvis., AB bracing,  and starting hinge bending for more spine friendly mvmnt patterns. Discussed AB bracing  with transitions.      Lumbar Exercises: Prone   Other Prone Lumbar Exercises Lying prone over 3 pillows x 10 mins   decreased LT LE symptoms ,but not fully     Modalities   Modalities Electrical Stimulation;Moist Heat;Traction      Moist Heat Therapy   Number Minutes Moist Heat 15 Minutes    Moist Heat Location Lumbar Spine      Electrical Stimulation   Electrical Stimulation Location bil lumbar    Electrical Stimulation Action IFC    Electrical Stimulation Parameters 80-150hz  x 15 mins    Electrical Stimulation Goals Pain;Tone      Traction   Type of Traction Lumbar    Min (lbs) 10    Max (lbs) 100    Hold Time 99    Rest Time 10    Time  15                         PT Long Term Goals - 11/02/20 0912       PT LONG TERM GOAL #1   Title Pt to be I in advance HEP to be able to decrease low back pain to no greater than a 2/10 .    Baseline HEP issued yet pain ongoing 11/02/20    Time 8    Period Weeks    Status Partially Met      PT LONG TERM GOAL #2   Title PT to be able to sit, stand or walk for over 30 mintues at a time to improve his functional ability for yard and housework.    Baseline limited standing time 11/02/20    Time 8    Period Weeks    Status On-going      PT LONG TERM GOAL #3   Title PT LE strength to be at least 4+ /5 to increase the ease of functional activites and to allow for better body mechanics to protect his back.    Time 8    Period Weeks    Status On-going      PT LONG TERM GOAL #4   Title Patient able to sleep at least 6 hours without waking from back pain.    Baseline sleeping 2-3 hours 11/02/20    Time 8    Period Weeks     Status On-going      PT LONG TERM GOAL #5   Title Improved bil knee flexion to 115 deg to improve ability to functionally squat with ADLS.    Baseline Bil knee 115 degrees flexion 10/26/20    Time 8    Period Weeks    Status Achieved                   Plan - 11/06/20 1656     Clinical Impression Statement Pt arrived today not doing well with increased pain in LB this morning and radiating pain. Prone position over 3 pillows x 10 mins performed with decreased pain. Pelvic traction performed at 100#S and tolerated fairly well. Try 110#s next Rx if Pt has good report. Postures also discussed with Pt to avoid pain triggers. Modalities tolerated well.    Comorbidities low back pain with sciatica; bil knee OA    Stability/Clinical Decision Making Evolving/Moderate complexity    Rehab Potential Good    PT Frequency 2x / week    PT Duration 8 weeks    PT Treatment/Interventions ADLs/Self Care Home Management;Cryotherapy;Electrical Stimulation;Moist Heat;Traction;Neuromuscular re-education;Therapeutic exercise;Therapeutic activities;Patient/family education;Manual techniques;Dry needling;Taping;Spinal Manipulations;Joint Manipulations    PT Next Visit Plan Back: work on [employer] and flexibility; try traction; extension biased TEwhen tolerated, body mechanics/ADLS; Knees: manual to ITB, flexibility and functional strength (painfree as possible)    Consulted and Agree with Plan of Care Patient             Patient will benefit from skilled therapeutic intervention in order to improve the following deficits and impairments:  Decreased range of motion, Increased muscle spasms, Pain, Impaired flexibility, Decreased strength  Visit Diagnosis: Radiculopathy, lumbar region  Muscle weakness (generalized)     Problem List Patient Active Problem List   Diagnosis Date Noted   Protrusion of lumbar intervertebral disc 04/13/2020   Family history of colon cancer 05/12/2019   Other  intervertebral disc degeneration, lumbar region 05/06/2019   Essential hypertension 09/01/2018  Insomnia 10/23/2015   Obstructive sleep apnea 01/16/2015   Gout 01/05/2015    Dazia Lippold,CHRIS,PTA 11/06/2020, 5:08 PM  [facility] [address] [place], [place], [address] Phone: [phone]   Fax:  [phone]  Name: [name] J Iran Jr. MRN: [id number] Date of Birth: 20-Oct-1965

## 2020-11-08 ENCOUNTER — Other Ambulatory Visit: Payer: Self-pay

## 2020-11-08 ENCOUNTER — Ambulatory Visit: Admitting: Physical Therapy

## 2020-11-08 DIAGNOSIS — M25562 Pain in left knee: Secondary | ICD-10-CM

## 2020-11-08 DIAGNOSIS — M5416 Radiculopathy, lumbar region: Secondary | ICD-10-CM | POA: Diagnosis not present

## 2020-11-08 DIAGNOSIS — G8929 Other chronic pain: Secondary | ICD-10-CM

## 2020-11-08 DIAGNOSIS — M6281 Muscle weakness (generalized): Secondary | ICD-10-CM

## 2020-11-08 NOTE — Therapy (Signed)
[facility] [place], [place], [address] Phone: [phone]   Fax:  [phone]  Physical Therapy Treatment  Patient Details  Name: [name] J Iran Jr. MRN: [id number] Date of Birth: Sep 27, 1965 Referring Provider (PT): Claretta Fraise MD   Encounter Date: 11/08/2020   PT End of Session - 11/08/20 0933     Visit Number 12    Number of Visits 16    Date for PT Re-Evaluation 11/28/20    Authorization Type [employer]    PT Start Time 0903    PT Stop Time 0951    PT Time Calculation (min) 48 min    Activity Tolerance Patient tolerated treatment well    Behavior During Therapy [facility] for tasks assessed/performed             Past Medical History:  Diagnosis Date   Gout    Hypertension    Medical history non-contributory    Wears glasses     Past Surgical History:  Procedure Laterality Date   COLONOSCOPY WITH PROPOFOL N/A 08/02/2019   Procedure: COLONOSCOPY WITH PROPOFOL;  Surgeon: Daneil Dolin, MD;  [facility];  Service: Endoscopy;  Laterality: N/A;  9:15am   KNEE ARTHROSCOPY WITH PATELLAR TENDON REPAIR Left 09/03/2012   Procedure: LEFT KNEE ARTHROSCOPY, LOOSE BODY EXCISION, PATELLAR TENDON REPAIR, EXCISION TIBIAL TUBERCLE SPUR, CHONDROPLASTY PATELLA, EXCISION PLICA;  Surgeon: Ninetta Lights, MD;  Location: [facility];  Service: Orthopedics;  Laterality: Left;   WISDOM TOOTH EXTRACTION      There were no vitals filed for this visit.   Subjective Assessment - 11/08/20 0905     Subjective Covid-19 screening performed upon arrival. Patient reported doing well after last treatment and flare up subsided some    Pertinent History HTN, sleep apnea, arthroscope left knee June 2014    How long can you sit comfortably? 60 min    How long can you stand comfortably? 10 min    Diagnostic tests xrays - severe degeneration left knee; MRI disc protrusion L3/4 (2021)    Patient Stated Goals pain management  for knees and back    Currently in Pain? Yes    Pain Score 4     Pain Location Back    Pain Orientation Left;Lower    Pain Descriptors / Indicators Discomfort    Pain Type Chronic pain    Pain Onset More than a month ago    Pain Frequency Intermittent    Aggravating Factors  certain movements    Pain Relieving Factors at rest                               [facility] Adult PT Treatment/Exercise - 11/08/20 0001       Lumbar Exercises: Stretches   Other Lumbar Stretch Exercise Forward sink stretch with hold      Lumbar Exercises: Prone   Other Prone Lumbar Exercises Lying prone over pillows flat and on elbows with core activation x 67mn      Knee/Hip Exercises: Aerobic   Nustep L4x144m UE/LE activity, core focus      Moist Heat Therapy   Number Minutes Moist Heat 15 Minutes    Moist Heat Location Lumbar Spine      Electrical Stimulation   Electrical Stimulation Location bil lumbar    Electrical Stimulation Action IFC    Electrical Stimulation Parameters 80-_0  x1065m   Electrical Stimulation Goals Pain;Tone  Traction   Type of Traction Lumbar    Min (lbs) 10    Max (lbs) 105    Hold Time 99    Rest Time 10    Time 15                         PT Long Term Goals - 11/08/20 0908       PT LONG TERM GOAL #1   Title Pt to be I in advance HEP to be able to decrease low back pain to no greater than a 2/10 .    Baseline HEP issued yet pain ongoing 11/02/20    Time 8    Period Weeks    Status Partially Met      PT LONG TERM GOAL #2   Title PT to be able to sit, stand or walk for over 30 mintues at a time to improve his functional ability for yard and housework.    Baseline limited standing time 11/08/20    Time 8    Period Weeks    Status On-going      PT LONG TERM GOAL #3   Title PT LE strength to be at least 4+ /5 to increase the ease of functional activites and to allow for better body mechanics to protect his back.    Time 8     Period Weeks    Status On-going      PT LONG TERM GOAL #4   Title Patient able to sleep at least 6 hours without waking from back pain.    Baseline sleeping 2-3 hours 11/08/20    Time 8    Period Weeks    Status On-going      PT LONG TERM GOAL #5   Title Improved bil knee flexion to 115 deg to improve ability to functionally squat with ADLS.    Baseline Bil knee 115 degrees flexion 10/26/20    Time 8    Period Weeks    Status Achieved                   Plan - 11/08/20 0936     Clinical Impression Statement Patient tolerated treatment well today. today focused on light progression due to flare ups. Patient able to focus on light core progression and sink stretch today followed by mechanical lumbar traction. Patient has palpable pain in low back today and too painful for manual STW. Goals ongoing. Good response today.    Personal Factors and Comorbidities Comorbidity 2    Comorbidities low back pain with sciatica; bil knee OA    Stability/Clinical Decision Making Evolving/Moderate complexity    Rehab Potential Good    PT Frequency 2x / week    PT Duration 8 weeks    PT Treatment/Interventions ADLs/Self Care Home Management;Cryotherapy;Electrical Stimulation;Moist Heat;Traction;Neuromuscular re-education;Therapeutic exercise;Therapeutic activities;Patient/family education;Manual techniques;Dry needling;Taping;Spinal Manipulations;Joint Manipulations    PT Next Visit Plan Back: work on [employer] and flexibility; try traction; extension biased TEwhen tolerated, body mechanics/ADLS; Knees: manual to ITB, flexibility and functional strength (painfree as possible)    Consulted and Agree with Plan of Care Patient             Patient will benefit from skilled therapeutic intervention in order to improve the following deficits and impairments:  Decreased range of motion, Increased muscle spasms, Pain, Impaired flexibility, Decreased strength  Visit Diagnosis: Radiculopathy,  lumbar region  Muscle weakness (generalized)  Chronic pain of left knee  Chronic pain  of right knee     Problem List Patient Active Problem List   Diagnosis Date Noted   Protrusion of lumbar intervertebral disc 04/13/2020   Family history of colon cancer 05/12/2019   Other intervertebral disc degeneration, lumbar region 05/06/2019   Essential hypertension 09/01/2018   Insomnia 10/23/2015   Obstructive sleep apnea 01/16/2015   Gout 01/05/2015    Briselda Naval P, PTA 11/08/2020, 10:07 AM  [facility] [place], [place], [address] Phone: [phone]   Fax:  [phone]  Name: [name] J Iran Jr. MRN: [id number] Date of Birth: 05/12/1965

## 2020-11-14 ENCOUNTER — Other Ambulatory Visit: Payer: Self-pay | Admitting: Nephrology

## 2020-11-14 ENCOUNTER — Other Ambulatory Visit (HOSPITAL_COMMUNITY): Payer: Self-pay | Admitting: Nephrology

## 2020-11-14 DIAGNOSIS — N170 Acute kidney failure with tubular necrosis: Secondary | ICD-10-CM

## 2020-11-14 DIAGNOSIS — N1832 Chronic kidney disease, stage 3b: Secondary | ICD-10-CM

## 2020-11-14 DIAGNOSIS — I129 Hypertensive chronic kidney disease with stage 1 through stage 4 chronic kidney disease, or unspecified chronic kidney disease: Secondary | ICD-10-CM

## 2020-11-15 ENCOUNTER — Other Ambulatory Visit: Payer: Self-pay

## 2020-11-15 ENCOUNTER — Ambulatory Visit: Attending: Family Medicine | Admitting: Physical Therapy

## 2020-11-15 DIAGNOSIS — M6281 Muscle weakness (generalized): Secondary | ICD-10-CM | POA: Insufficient documentation

## 2020-11-15 DIAGNOSIS — G8929 Other chronic pain: Secondary | ICD-10-CM | POA: Diagnosis present

## 2020-11-15 DIAGNOSIS — M25562 Pain in left knee: Secondary | ICD-10-CM | POA: Insufficient documentation

## 2020-11-15 DIAGNOSIS — M25561 Pain in right knee: Secondary | ICD-10-CM | POA: Insufficient documentation

## 2020-11-15 DIAGNOSIS — M5416 Radiculopathy, lumbar region: Secondary | ICD-10-CM | POA: Diagnosis present

## 2020-11-15 NOTE — Therapy (Signed)
[facility] [place], [place], [address] Phone: [phone]   Fax:  [phone]  Physical Therapy Treatment  Patient Details  Name: [name] J Iran Jr. MRN: [id number] Date of Birth: [birth year] Referring Provider (PT): Claretta Fraise MD   Encounter Date: 11/15/2020   PT End of Session - 11/15/20 0953     Visit Number 13    Number of Visits 16    Date for PT Re-Evaluation 11/28/20    Authorization Type [employer]    PT Start Time 0950    PT Stop Time 1034    PT Time Calculation (min) 44 min    Activity Tolerance Patient tolerated treatment well    Behavior During Therapy [facility] for tasks assessed/performed             Past Medical History:  Diagnosis Date   Gout    Hypertension    Medical history non-contributory    Wears glasses     Past Surgical History:  Procedure Laterality Date   COLONOSCOPY WITH PROPOFOL N/A 08/02/2019   Procedure: COLONOSCOPY WITH PROPOFOL;  Surgeon: Daneil Dolin, MD;  [facility];  Service: Endoscopy;  Laterality: N/A;  9:15am   KNEE ARTHROSCOPY WITH PATELLAR TENDON REPAIR Left 09/03/2012   Procedure: LEFT KNEE ARTHROSCOPY, LOOSE BODY EXCISION, PATELLAR TENDON REPAIR, EXCISION TIBIAL TUBERCLE SPUR, CHONDROPLASTY PATELLA, EXCISION PLICA;  Surgeon: Ninetta Lights, MD;  Location: [facility];  Service: Orthopedics;  Laterality: Left;   WISDOM TOOTH EXTRACTION      There were no vitals filed for this visit.   Subjective Assessment - 11/15/20 0952     Subjective Covid-19 screening performed upon arrival. Patient reported some ongoing pain in hip/back and right knee    Pertinent History HTN, sleep apnea, arthroscope left knee June 2014    How long can you sit comfortably? 60 min    How long can you stand comfortably? 10 min    Diagnostic tests xrays - severe degeneration left knee; MRI disc protrusion L3/4 (2021)    Patient Stated Goals pain management for knees and  back    Currently in Pain? Yes    Pain Score 4     Pain Location Back    Pain Orientation Left;Lower    Pain Descriptors / Indicators Discomfort    Pain Type Chronic pain    Pain Onset More than a month ago    Pain Frequency Intermittent    Aggravating Factors  certain movements    Pain Relieving Factors rest                               [facility] Adult PT Treatment/Exercise - 11/15/20 0001       Lumbar Exercises: Supine   Bridge 20 reps;3 seconds      Knee/Hip Exercises: Aerobic   Nustep L4x67min UE/LE activity, core focus      Knee/Hip Exercises: Standing   Other Standing Knee Exercises latt pull with blue XTS with holds x 20 w abdominal bracing    Other Standing Knee Exercises 6# ball for step outs 2x10      Moist Heat Therapy   Number Minutes Moist Heat 10 Minutes    Moist Heat Location Lumbar Spine      Electrical Stimulation   Electrical Stimulation Location bil lumbar    Electrical Stimulation Action IFC    Electrical Stimulation Parameters 80-150hz  x49min    Electrical  Stimulation Goals Pain;Tone      Traction   Type of Traction Lumbar    Min (lbs) 10    Max (lbs) 105    Hold Time 99    Rest Time 10    Time 15                  Upper Extremity Functional Index Score :   /80        PT Long Term Goals - 11/15/20 1011       PT LONG TERM GOAL #1   Title Pt to be I in advance HEP to be able to decrease low back pain to no greater than a 2/10 .    Baseline 11/15/20 understands HEP as issued    Time 8    Period Weeks    Status Achieved      PT LONG TERM GOAL #2   Title PT to be able to sit, stand or walk for over 30 mintues at a time to improve his functional ability for yard and housework.    Baseline limited standing time 11/15/20    Time 8    Period Weeks    Status On-going      PT LONG TERM GOAL #3   Title PT LE strength to be at least 4+ /5 to increase the ease of functional activites and to allow for better body  mechanics to protect his back.    Time 8    Period Weeks    Status On-going      PT LONG TERM GOAL #4   Title Patient able to sleep at least 6 hours without waking from back pain.    Baseline sleeping 2-3 hours 11/15/20    Time 8    Period Weeks    Status On-going      PT LONG TERM GOAL #5   Title Improved bil knee flexion to 115 deg to improve ability to functionally squat with ADLS.    Baseline Bil knee 115 degrees flexion 10/26/20    Time 8    Period Weeks    Status Achieved                   Plan - 11/15/20 1013     Clinical Impression Statement Patient tolerated treatment well today. Patient able to progress back with core activities and slow progression followed my ES/heat and traction. Patient continues to have limitations with prolong standing and activity due to back/hippain and knee pain. Patient understands HEP and met goal with others ongoing.    Personal Factors and Comorbidities Comorbidity 2    Comorbidities low back pain with sciatica; bil knee OA    Stability/Clinical Decision Making Evolving/Moderate complexity    Rehab Potential Good    PT Frequency 2x / week    PT Duration 8 weeks    PT Treatment/Interventions ADLs/Self Care Home Management;Cryotherapy;Electrical Stimulation;Moist Heat;Traction;Neuromuscular re-education;Therapeutic exercise;Therapeutic activities;Patient/family education;Manual techniques;Dry needling;Taping;Spinal Manipulations;Joint Manipulations    PT Next Visit Plan Back: work on [employer] and flexibility; try traction; extension biased TEwhen tolerated, body mechanics/ADLS; Knees: manual to ITB, flexibility and functional strength (painfree as possible)    Consulted and Agree with Plan of Care Patient             Patient will benefit from skilled therapeutic intervention in order to improve the following deficits and impairments:  Decreased range of motion, Increased muscle spasms, Pain, Impaired flexibility, Decreased  strength  Visit Diagnosis: Radiculopathy, lumbar region  Muscle  weakness (generalized)  Chronic pain of left knee  Chronic pain of right knee     Problem List Patient Active Problem List   Diagnosis Date Noted   Protrusion of lumbar intervertebral disc 04/13/2020   Family history of colon cancer 05/12/2019   Other intervertebral disc degeneration, lumbar region 05/06/2019   Essential hypertension 09/01/2018   Insomnia 10/23/2015   Obstructive sleep apnea 01/16/2015   Gout 01/05/2015    Germain Koopmann P, PTA 11/15/2020, 10:46 AM  [facility] [place], [place], [address] Phone: [phone]   Fax:  [phone]  Name: [name] J Iran Jr. MRN: [id number] Date of Birth: 1965/07/09

## 2020-11-20 ENCOUNTER — Ambulatory Visit: Admitting: Physical Therapy

## 2020-11-22 ENCOUNTER — Other Ambulatory Visit: Payer: Self-pay

## 2020-11-22 ENCOUNTER — Ambulatory Visit: Admitting: Physical Therapy

## 2020-11-22 DIAGNOSIS — M5416 Radiculopathy, lumbar region: Secondary | ICD-10-CM | POA: Diagnosis not present

## 2020-11-22 DIAGNOSIS — G8929 Other chronic pain: Secondary | ICD-10-CM

## 2020-11-22 DIAGNOSIS — M25562 Pain in left knee: Secondary | ICD-10-CM

## 2020-11-22 DIAGNOSIS — M6281 Muscle weakness (generalized): Secondary | ICD-10-CM

## 2020-11-22 NOTE — Therapy (Signed)
[facility] [address] [place], [place], [address] Phone: [phone]   Fax:  [phone]  Physical Therapy Treatment  Patient Details  Name: [name] J Guinea-Bissau Jr. MRN: [id number] Date of Birth: Aug 20, 1965 Referring Provider (PT): Mechele Claude MD   Encounter Date: 11/22/2020   PT End of Session - 11/22/20 0921     Visit Number 14    Number of Visits 16    Date for PT Re-Evaluation 11/28/20    Authorization Type [employer]    PT Start Time 0917   late arrival   PT Stop Time 1000    PT Time Calculation (min) 43 min    Activity Tolerance Patient tolerated treatment well    Behavior During Therapy [facility] for tasks assessed/performed             Past Medical History:  Diagnosis Date   Gout    Hypertension    Medical history non-contributory    Wears glasses     Past Surgical History:  Procedure Laterality Date   COLONOSCOPY WITH PROPOFOL N/A 08/02/2019   Procedure: COLONOSCOPY WITH PROPOFOL;  Surgeon: Corbin Ade, MD;  [facility];  Service: Endoscopy;  Laterality: N/A;  9:15am   KNEE ARTHROSCOPY WITH PATELLAR TENDON REPAIR Left 09/03/2012   Procedure: LEFT KNEE ARTHROSCOPY, LOOSE BODY EXCISION, PATELLAR TENDON REPAIR, EXCISION TIBIAL TUBERCLE SPUR, CHONDROPLASTY PATELLA, EXCISION PLICA;  Surgeon: Loreta Ave, MD;  Location: [facility];  Service: Orthopedics;  Laterality: Left;   WISDOM TOOTH EXTRACTION      There were no vitals filed for this visit.   Subjective Assessment - 11/22/20 0920     Subjective Covid-19 screening performed upon arrival. Patient reported feeling the "same "    Pertinent History HTN, sleep apnea, arthroscope left knee June 2014    How long can you sit comfortably? 60 min    How long can you stand comfortably? 10 min    Diagnostic tests xrays - severe degeneration left knee; MRI disc protrusion L3/4 (2021)    Patient Stated Goals pain management for knees and back     Currently in Pain? Yes    Pain Score 4     Pain Location Back    Pain Orientation Left;Lower    Pain Descriptors / Indicators Discomfort    Pain Type Chronic pain    Pain Onset More than a month ago    Pain Frequency Intermittent    Aggravating Factors  certain movements    Pain Relieving Factors at rest                               [facility] Adult PT Treatment/Exercise - 11/22/20 0001       Knee/Hip Exercises: Aerobic   Nustep L4x35min UE/LE activity, core focus      Knee/Hip Exercises: Standing   Other Standing Knee Exercises latt pull with blue XTS with holds x 30 w abdominal bracing    Other Standing Knee Exercises 6# ball for step outs and diagnols 2x10 each      Moist Heat Therapy   Number Minutes Moist Heat 10 Minutes    Moist Heat Location Lumbar Spine      Electrical Stimulation   Electrical Stimulation Location bil lumbar    Electrical Stimulation Action IFC    Electrical Stimulation Parameters 80-150hz  x40min    Electrical Stimulation Goals Pain;Tone      Traction  Type of Traction Lumbar    Min (lbs) 10    Max (lbs) 105    Hold Time 99    Rest Time 10    Time 15                          PT Long Term Goals - 11/22/20 0923       PT LONG TERM GOAL #1   Title Pt to be I in advance HEP to be able to decrease low back pain to no greater than a 2/10 .    Baseline 11/15/20 understands HEP as issued    Time 8    Period Weeks    Status Achieved      PT LONG TERM GOAL #2   Title PT to be able to sit, stand or walk for over 30 mintues at a time to improve his functional ability for yard and housework.    Baseline limited standing time 11/22/20    Time 8    Period Weeks    Status On-going      PT LONG TERM GOAL #3   Title PT LE strength to be at least 4+ /5 to increase the ease of functional activites and to allow for better body mechanics to protect his back.    Time 8    Period Weeks    Status On-going      PT LONG  TERM GOAL #4   Title Patient able to sleep at least 6 hours without waking from back pain.    Baseline sleeping 2-3 hours 11/22/20    Time 8    Period Weeks    Status On-going      PT LONG TERM GOAL #5   Title Improved bil knee flexion to 115 deg to improve ability to functionally squat with ADLS.    Baseline Bil knee 115 degrees flexion 10/26/20    Time 8    Period Weeks    Status Achieved                   Plan - 11/22/20 0942     Clinical Impression Statement Patient tolerated treatment well today. Patient continues to have the "same" pain and difficulty with prolong walking and sleeping. Today progressed with functional core activities today. Patient continues to respond well to traction. Goals ongoing this week.    Personal Factors and Comorbidities Comorbidity 2    Comorbidities low back pain with sciatica; bil knee OA    Stability/Clinical Decision Making Evolving/Moderate complexity    Rehab Potential Good    PT Frequency 2x / week    PT Duration 8 weeks    PT Treatment/Interventions ADLs/Self Care Home Management;Cryotherapy;Electrical Stimulation;Moist Heat;Traction;Neuromuscular re-education;Therapeutic exercise;Therapeutic activities;Patient/family education;Manual techniques;Dry needling;Taping;Spinal Manipulations;Joint Manipulations    PT Next Visit Plan Back: work on [employer] and flexibility; try traction; extension biased TEwhen tolerated, body mechanics/ADLS; Knees: manual to ITB, flexibility and functional strength (painfree as possible)    Consulted and Agree with Plan of Care Patient             Patient will benefit from skilled therapeutic intervention in order to improve the following deficits and impairments:  Decreased range of motion, Increased muscle spasms, Pain, Impaired flexibility, Decreased strength  Visit Diagnosis: Radiculopathy, lumbar region  Muscle weakness (generalized)  Chronic pain of left knee  Chronic pain of right  knee     Problem List Patient Active Problem List   Diagnosis Date  Noted   Protrusion of lumbar intervertebral disc 04/13/2020   Family history of colon cancer 05/12/2019   Other intervertebral disc degeneration, lumbar region 05/06/2019   Essential hypertension 09/01/2018   Insomnia 10/23/2015   Obstructive sleep apnea 01/16/2015   Gout 01/05/2015    Keyton Bhat P, PTA 11/22/2020, 9:53 AM  [facility] [address] [place], [place], [address] Phone: [phone]   Fax:  [phone]  Name: [name] J Guinea-Bissau Jr. MRN: [id number] Date of Birth: 06-Nov-1965

## 2020-11-22 NOTE — Therapy (Signed)
[facility] [address] [place], [place], [address] Phone: [phone]   Fax:  [phone]  Physical Therapy Treatment  Patient Details  Name: [name] J Guinea-Bissau Jr. MRN: [id number] Date of Birth: Aug 20, 1965 Referring Provider (PT): Mechele Claude MD   Encounter Date: 11/22/2020   PT End of Session - 11/22/20 0921     Visit Number 14    Number of Visits 16    Date for PT Re-Evaluation 11/28/20    Authorization Type [employer]    PT Start Time 0917   late arrival   PT Stop Time 1000    PT Time Calculation (min) 43 min    Activity Tolerance Patient tolerated treatment well    Behavior During Therapy [facility] for tasks assessed/performed             Past Medical History:  Diagnosis Date   Gout    Hypertension    Medical history non-contributory    Wears glasses     Past Surgical History:  Procedure Laterality Date   COLONOSCOPY WITH PROPOFOL N/A 08/02/2019   Procedure: COLONOSCOPY WITH PROPOFOL;  Surgeon: Corbin Ade, MD;  [facility];  Service: Endoscopy;  Laterality: N/A;  9:15am   KNEE ARTHROSCOPY WITH PATELLAR TENDON REPAIR Left 09/03/2012   Procedure: LEFT KNEE ARTHROSCOPY, LOOSE BODY EXCISION, PATELLAR TENDON REPAIR, EXCISION TIBIAL TUBERCLE SPUR, CHONDROPLASTY PATELLA, EXCISION PLICA;  Surgeon: Loreta Ave, MD;  Location: [facility];  Service: Orthopedics;  Laterality: Left;   WISDOM TOOTH EXTRACTION      There were no vitals filed for this visit.   Subjective Assessment - 11/22/20 0920     Subjective Covid-19 screening performed upon arrival. Patient reported feeling the "same "    Pertinent History HTN, sleep apnea, arthroscope left knee June 2014    How long can you sit comfortably? 60 min    How long can you stand comfortably? 10 min    Diagnostic tests xrays - severe degeneration left knee; MRI disc protrusion L3/4 (2021)    Patient Stated Goals pain management for knees and back     Currently in Pain? Yes    Pain Score 4     Pain Location Back    Pain Orientation Left;Lower    Pain Descriptors / Indicators Discomfort    Pain Type Chronic pain    Pain Onset More than a month ago    Pain Frequency Intermittent    Aggravating Factors  certain movements    Pain Relieving Factors at rest                               [facility] Adult PT Treatment/Exercise - 11/22/20 0001       Knee/Hip Exercises: Aerobic   Nustep L4x35min UE/LE activity, core focus      Knee/Hip Exercises: Standing   Other Standing Knee Exercises latt pull with blue XTS with holds x 30 w abdominal bracing    Other Standing Knee Exercises 6# ball for step outs and diagnols 2x10 each      Moist Heat Therapy   Number Minutes Moist Heat 10 Minutes    Moist Heat Location Lumbar Spine      Electrical Stimulation   Electrical Stimulation Location bil lumbar    Electrical Stimulation Action IFC    Electrical Stimulation Parameters 80-150hz  x40min    Electrical Stimulation Goals Pain;Tone      Traction  Type of Traction Lumbar    Min (lbs) 10    Max (lbs) 105    Hold Time 99    Rest Time 10    Time 15                          PT Long Term Goals - 11/22/20 0923       PT LONG TERM GOAL #1   Title Pt to be I in advance HEP to be able to decrease low back pain to no greater than a 2/10 .    Baseline 11/15/20 understands HEP as issued    Time 8    Period Weeks    Status Achieved      PT LONG TERM GOAL #2   Title PT to be able to sit, stand or walk for over 30 mintues at a time to improve his functional ability for yard and housework.    Baseline limited standing time 11/22/20    Time 8    Period Weeks    Status On-going      PT LONG TERM GOAL #3   Title PT LE strength to be at least 4+ /5 to increase the ease of functional activites and to allow for better body mechanics to protect his back.    Time 8    Period Weeks    Status On-going      PT LONG  TERM GOAL #4   Title Patient able to sleep at least 6 hours without waking from back pain.    Baseline sleeping 2-3 hours 11/22/20    Time 8    Period Weeks    Status On-going      PT LONG TERM GOAL #5   Title Improved bil knee flexion to 115 deg to improve ability to functionally squat with ADLS.    Baseline Bil knee 115 degrees flexion 10/26/20    Time 8    Period Weeks    Status Achieved                   Plan - 11/22/20 0942     Clinical Impression Statement Patient tolerated treatment well today. Patient continues to have the "same" pain and difficulty with prolong walking and sleeping. Today progressed with functional core activities today. Patient continues to respond well to traction. Goals ongoing this week.    Personal Factors and Comorbidities Comorbidity 2    Comorbidities low back pain with sciatica; bil knee OA    Stability/Clinical Decision Making Evolving/Moderate complexity    Rehab Potential Good    PT Frequency 2x / week    PT Duration 8 weeks    PT Treatment/Interventions ADLs/Self Care Home Management;Cryotherapy;Electrical Stimulation;Moist Heat;Traction;Neuromuscular re-education;Therapeutic exercise;Therapeutic activities;Patient/family education;Manual techniques;Dry needling;Taping;Spinal Manipulations;Joint Manipulations    PT Next Visit Plan cont with POC assess goals and DC next visit, patient F/U with MD pending    Consulted and Agree with Plan of Care Patient             Patient will benefit from skilled therapeutic intervention in order to improve the following deficits and impairments:  Decreased range of motion, Increased muscle spasms, Pain, Impaired flexibility, Decreased strength  Visit Diagnosis: Radiculopathy, lumbar region  Muscle weakness (generalized)  Chronic pain of left knee  Chronic pain of right knee     Problem List Patient Active Problem List   Diagnosis Date Noted   Protrusion of lumbar intervertebral disc  04/13/2020  Family history of colon cancer 05/12/2019   Other intervertebral disc degeneration, lumbar region 05/06/2019   Essential hypertension 09/01/2018   Insomnia 10/23/2015   Obstructive sleep apnea 01/16/2015   Gout 01/05/2015    Shyenne Maggard P, PTA 11/22/2020, 10:15 AM  [facility] [address] [place], [place], [address] Phone: [phone]   Fax:  [phone]  Name: [name] J Guinea-Bissau Jr. MRN: [id number] Date of Birth: [birth year]

## 2020-11-23 ENCOUNTER — Ambulatory Visit: Admitting: *Deleted

## 2020-11-23 ENCOUNTER — Other Ambulatory Visit: Payer: Self-pay

## 2020-11-23 DIAGNOSIS — M5416 Radiculopathy, lumbar region: Secondary | ICD-10-CM

## 2020-11-23 DIAGNOSIS — M6281 Muscle weakness (generalized): Secondary | ICD-10-CM

## 2020-11-23 NOTE — Therapy (Signed)
[facility] [address] [place], [place], [address] Phone: [phone]   Fax:  [phone]  Physical Therapy Treatment  Patient Details  Name: [name] J Guinea-Bissau Jr. MRN: [id number] Date of Birth: 1966-02-03 Referring Provider (PT): Mechele Claude MD   Encounter Date: 11/23/2020   PT End of Session - 11/23/20 0921     Visit Number 15    Number of Visits 16    Date for PT Re-Evaluation 11/28/20    Authorization Type [employer]    PT Start Time 0908    PT Stop Time 1005    PT Time Calculation (min) 57 min             Past Medical History:  Diagnosis Date   Gout    Hypertension    Medical history non-contributory    Wears glasses     Past Surgical History:  Procedure Laterality Date   COLONOSCOPY WITH PROPOFOL N/A 08/02/2019   Procedure: COLONOSCOPY WITH PROPOFOL;  Surgeon: Corbin Ade, MD;  [facility];  Service: Endoscopy;  Laterality: N/A;  9:15am   KNEE ARTHROSCOPY WITH PATELLAR TENDON REPAIR Left 09/03/2012   Procedure: LEFT KNEE ARTHROSCOPY, LOOSE BODY EXCISION, PATELLAR TENDON REPAIR, EXCISION TIBIAL TUBERCLE SPUR, CHONDROPLASTY PATELLA, EXCISION PLICA;  Surgeon: Loreta Ave, MD;  Location: [facility];  Service: Orthopedics;  Laterality: Left;   WISDOM TOOTH EXTRACTION      There were no vitals filed for this visit.   Subjective Assessment - 11/23/20 0919     Subjective Covid-19 screening performed upon arrival. Patient reported feeling the "same " Traction not helping. Sciatic nerve acting up this morning    Pertinent History HTN, sleep apnea, arthroscope left knee June 2014    How long can you sit comfortably? 60 min    How long can you stand comfortably? 10 min    Diagnostic tests xrays - severe degeneration left knee; MRI disc protrusion L3/4 (2021)    Patient Stated Goals pain management for knees and back    Currently in Pain? Yes    Pain Score 5     Pain Location Back    Pain  Orientation Left    Pain Type Chronic pain    Pain Onset More than a month ago                               [facility] Adult PT Treatment/Exercise - 11/23/20 0001       Self-Care   Other Self-Care Comments  Discussed donning socks and shoes   while sitting, in/out of car.      Therapeutic Activites    Therapeutic Activities ADL's;Lifting    ADL's discussed neutral pos. for pelvis., AB bracing,  and starting hinge bending for more spine friendly mvmnt patterns. Discussed AB bracing  with transitions  with Log roll geeting in/out bed.      Exercises   Exercises Lumbar      Lumbar Exercises: Supine   Other Supine Lumbar Exercises Discussed HEP with AB bracing and neutral pelvis position. Bridging and marching were tolerated for core strengthening      Knee/Hip Exercises: Aerobic   Nustep L4x45min UE/LE activity, core focus      Moist Heat Therapy   Number Minutes Moist Heat 15 Minutes    Moist Heat Location Lumbar Spine      Electrical Stimulation   Electrical Stimulation Location bil lumbar  Electrical Stimulation Action IFC    Electrical Stimulation Parameters 80-150hz  x 15 mins    Electrical Stimulation Goals Pain;Tone                          PT Long Term Goals - 11/23/20 8119       PT LONG TERM GOAL #1   Title Pt to be I in advance HEP to be able to decrease low back pain to no greater than a 2/10 .    Baseline 11/15/20 understands HEP as issued    Time 8    Period Weeks    Status Partially Met   NM due to pain greater than 2/10     PT LONG TERM GOAL #2   Title PT to be able to sit, stand or walk for over 30 mintues at a time to improve his functional ability for yard and housework.    Baseline limited standing time 11/22/20    Time 8    Period Weeks    Status Not Met   NM due to pain     PT LONG TERM GOAL #3   Title PT LE strength to be at least 4+ /5 to increase the ease of functional activites and to allow for better body  mechanics to protect his back.    Time 8    Period Weeks    Status Not Met   NM due to pain     PT LONG TERM GOAL #4   Title Patient able to sleep at least 6 hours without waking from back pain.    Baseline sleeping 2-3 hours 11/22/20    Time 8    Period Weeks    Status Not Met   NM due to pain                  Plan - 11/23/20 0921     Clinical Impression Statement Pt arrived today doing fair, but continues to have LBP with sciatica. Rx focused on postures and spine friendly movement patterns to decrease pain triggers. Core strengthening exercises that he tolerates at this time are bridging and hooklyin marching  and plans to continue those. Pt unable to meet any LTGs for his back at this time due to pain. Traction not performed today due to no improvement as per Pt.    Personal Factors and Comorbidities Comorbidity 2    Comorbidities low back pain with sciatica; bil knee OA    Stability/Clinical Decision Making Evolving/Moderate complexity    Rehab Potential Good    PT Frequency 2x / week    PT Duration 8 weeks    PT Treatment/Interventions ADLs/Self Care Home Management;Cryotherapy;Electrical Stimulation;Moist Heat;Traction;Neuromuscular re-education;Therapeutic exercise;Therapeutic activities;Patient/family education;Manual techniques;Dry needling;Taping;Spinal Manipulations;Joint Manipulations    PT Next Visit Plan patient F/U with MD pending    Consulted and Agree with Plan of Care Patient             Patient will benefit from skilled therapeutic intervention in order to improve the following deficits and impairments:  Decreased range of motion, Increased muscle spasms, Pain, Impaired flexibility, Decreased strength  Visit Diagnosis: Radiculopathy, lumbar region  Muscle weakness (generalized)     Problem List Patient Active Problem List   Diagnosis Date Noted   Protrusion of lumbar intervertebral disc 04/13/2020   Family history of colon cancer 05/12/2019    Other intervertebral disc degeneration, lumbar region 05/06/2019   Essential hypertension 09/01/2018   Insomnia 10/23/2015  Obstructive sleep apnea 01/16/2015   Gout 01/05/2015    [name] [name],[name] [name], [name] [name] 11/23/2020, 10:18 AM  [facility] [place], [place], [address] Phone: [phone]   Fax:  [phone]  Name: [name] J Iran Jr. MRN: [id number] Date of Birth: 08-27-1965

## 2020-11-27 ENCOUNTER — Other Ambulatory Visit: Payer: Self-pay

## 2020-11-27 ENCOUNTER — Ambulatory Visit (HOSPITAL_COMMUNITY)
Admission: RE | Admit: 2020-11-27 | Discharge: 2020-11-27 | Disposition: A | Discharge status: Home / Self Care | Source: Ambulatory Visit | Attending: Nephrology | Admitting: Nephrology

## 2020-11-27 DIAGNOSIS — I129 Hypertensive chronic kidney disease with stage 1 through stage 4 chronic kidney disease, or unspecified chronic kidney disease: Secondary | ICD-10-CM

## 2020-11-27 DIAGNOSIS — N1832 Chronic kidney disease, stage 3b: Secondary | ICD-10-CM

## 2020-11-27 DIAGNOSIS — N170 Acute kidney failure with tubular necrosis: Secondary | ICD-10-CM

## 2021-02-09 ENCOUNTER — Other Ambulatory Visit: Payer: Self-pay | Admitting: Nephrology

## 2021-02-09 ENCOUNTER — Other Ambulatory Visit (HOSPITAL_COMMUNITY): Payer: Self-pay | Admitting: Nephrology

## 2021-02-09 DIAGNOSIS — N170 Acute kidney failure with tubular necrosis: Secondary | ICD-10-CM

## 2021-02-09 DIAGNOSIS — N1832 Chronic kidney disease, stage 3b: Secondary | ICD-10-CM

## 2021-02-09 DIAGNOSIS — D638 Anemia in other chronic diseases classified elsewhere: Secondary | ICD-10-CM

## 2021-02-09 DIAGNOSIS — E6609 Other obesity due to excess calories: Secondary | ICD-10-CM

## 2021-02-18 ENCOUNTER — Other Ambulatory Visit: Payer: Self-pay | Admitting: Family Medicine

## 2021-02-23 ENCOUNTER — Other Ambulatory Visit: Payer: Self-pay | Admitting: Student

## 2021-02-26 ENCOUNTER — Other Ambulatory Visit: Payer: Self-pay

## 2021-02-26 ENCOUNTER — Other Ambulatory Visit (HOSPITAL_COMMUNITY): Payer: Self-pay | Admitting: Nephrology

## 2021-02-26 ENCOUNTER — Encounter (HOSPITAL_COMMUNITY): Payer: Self-pay

## 2021-02-26 ENCOUNTER — Ambulatory Visit (HOSPITAL_COMMUNITY)
Admission: RE | Admit: 2021-02-26 | Discharge: 2021-02-26 | Disposition: A | Discharge status: Home / Self Care | Source: Ambulatory Visit | Attending: Nephrology | Admitting: Nephrology

## 2021-02-26 DIAGNOSIS — N1832 Chronic kidney disease, stage 3b: Secondary | ICD-10-CM

## 2021-02-26 DIAGNOSIS — N170 Acute kidney failure with tubular necrosis: Secondary | ICD-10-CM | POA: Diagnosis not present

## 2021-02-26 DIAGNOSIS — Z6835 Body mass index (BMI) 35.0-35.9, adult: Secondary | ICD-10-CM | POA: Insufficient documentation

## 2021-02-26 DIAGNOSIS — D638 Anemia in other chronic diseases classified elsewhere: Secondary | ICD-10-CM

## 2021-02-26 DIAGNOSIS — E6609 Other obesity due to excess calories: Secondary | ICD-10-CM

## 2021-02-26 DIAGNOSIS — I129 Hypertensive chronic kidney disease with stage 1 through stage 4 chronic kidney disease, or unspecified chronic kidney disease: Secondary | ICD-10-CM | POA: Diagnosis not present

## 2021-02-26 DIAGNOSIS — E211 Secondary hyperparathyroidism, not elsewhere classified: Secondary | ICD-10-CM | POA: Diagnosis not present

## 2021-02-26 LAB — CBC
HCT: 36.7 % — ABNORMAL LOW (ref 39.0–52.0)
Hemoglobin: 12 g/dL — ABNORMAL LOW (ref 13.0–17.0)
MCH: 29 pg (ref 26.0–34.0)
MCHC: 32.7 g/dL (ref 30.0–36.0)
MCV: 88.6 fL (ref 80.0–100.0)
Platelets: 193 10*3/uL (ref 150–400)
RBC: 4.14 MIL/uL — ABNORMAL LOW (ref 4.22–5.81)
RDW: 13.2 % (ref 11.5–15.5)
WBC: 6.3 10*3/uL (ref 4.0–10.5)
nRBC: 0 % (ref 0.0–0.2)

## 2021-02-26 LAB — PROTIME-INR
INR: 1 (ref 0.8–1.2)
Prothrombin Time: 13.5 seconds (ref 11.4–15.2)

## 2021-02-26 MED ORDER — LIDOCAINE HCL (PF) 1 % IJ SOLN
INTRAMUSCULAR | Status: AC
  Filled 2021-02-26: qty 30

## 2021-02-26 MED ORDER — SODIUM CHLORIDE 0.9 % IV SOLN: INTRAVENOUS | Status: DC

## 2021-02-26 MED ORDER — GELATIN ABSORBABLE 12-7 MM EX MISC
CUTANEOUS | Status: AC
  Filled 2021-02-26: qty 1

## 2021-02-26 MED ORDER — MIDAZOLAM HCL 2 MG/2ML IJ SOLN
INTRAMUSCULAR | Status: AC
  Filled 2021-02-26: qty 2

## 2021-02-26 MED ORDER — MIDAZOLAM HCL 2 MG/2ML IJ SOLN
INTRAMUSCULAR | Status: AC | PRN
  Administered 2021-02-26 (×2): 1 mg via INTRAVENOUS

## 2021-02-26 MED ORDER — FENTANYL CITRATE (PF) 100 MCG/2ML IJ SOLN
INTRAMUSCULAR | Status: AC
  Filled 2021-02-26: qty 2

## 2021-02-26 MED ORDER — FENTANYL CITRATE (PF) 100 MCG/2ML IJ SOLN
INTRAMUSCULAR | Status: AC | PRN
  Administered 2021-02-26 (×2): 50 ug via INTRAVENOUS

## 2021-02-26 NOTE — H&P (Signed)
Chief Complaint: Patient was seen in consultation today for random renal biopsy at the request of Bhutani,Manpreet S  Referring Physician(s): Bhutani,Manpreet S  Supervising Physician: Irish Lack  Patient Status: [facility] - Out-pt  History of Present Illness: [name] [name] Guinea-Bissau Jr. is a 55 y.o. male   Known "renal issues" approx 1 yr PMD referred to Dr Wolfgang Phoenix in Aug 2022 Acute renal failure- tubular necrosis Hypertension Hyperparathyroidism  Scheduled for random renal biopsy per Nephrology    Past Medical History:  Diagnosis Date   Gout    Hypertension    Medical history non-contributory    Wears glasses     Past Surgical History:  Procedure Laterality Date   COLONOSCOPY WITH PROPOFOL N/A 08/02/2019   Procedure: COLONOSCOPY WITH PROPOFOL;  Surgeon: Corbin Ade, MD;  Location: [facility];  Service: Endoscopy;  Laterality: N/A;  9:15am   KNEE ARTHROSCOPY WITH PATELLAR TENDON REPAIR Left 09/03/2012   Procedure: LEFT KNEE ARTHROSCOPY, LOOSE BODY EXCISION, PATELLAR TENDON REPAIR, EXCISION TIBIAL TUBERCLE SPUR, CHONDROPLASTY PATELLA, EXCISION PLICA;  Surgeon: Loreta Ave, MD;  Location: [facility];  Service: Orthopedics;  Laterality: Left;   WISDOM TOOTH EXTRACTION      Allergies: Sulfa antibiotics  Medications: Prior to Admission medications   Medication Sig Start Date End Date Taking? Authorizing Provider  acetaminophen (TYLENOL) 500 MG tablet Take 500 mg by mouth every 6 (six) hours as needed for mild pain or headache.   Yes [provider]  allopurinol (ZYLOPRIM) 300 MG tablet Take 1 tablet (300 mg total) by mouth at bedtime. 03/29/20  Yes Stacks, Broadus John, MD  chlorthalidone (HYGROTON) 25 MG tablet Take 12.5 mg by mouth daily.   Yes [provider]  Eszopiclone 3 MG TABS Take 1 tablet (3 mg total) by mouth at bedtime. Take immediately before bedtime 09/26/20  Yes Stacks, Broadus John, MD  metoprolol succinate (TOPROL-XL) 100 MG 24 hr  tablet TAKE 1 TABLET BY MOUTH EVERY DAY WITH OR IMMEDIATELY FOLLOWING A MEAL 02/19/21  Yes Stacks, Broadus John, MD  tiZANidine (ZANAFLEX) 4 MG tablet Take 4 mg by mouth every 8 (eight) hours as needed for muscle spasms.   Yes [provider]  triamterene-hydrochlorothiazide (MAXZIDE-25) 37.5-25 MG tablet Take 0.5 tablets by mouth daily. Patient not taking: Reported on 02/21/2021 10/05/20   Mechele Claude, MD     Family History  Problem Relation Age of Onset   Colon cancer Mother        Precancerous colon polyps; unsure of actual CRC?   Diabetes Father    Hypertension Father    Heart attack Father     Social History   Socioeconomic History   Marital status: Married    Spouse name: Not on file   Number of children: Not on file   Years of education: Not on file   Highest education level: Not on file  Occupational History   Not on file  Tobacco Use   Smoking status: Never   Smokeless tobacco: Never  Substance and Sexual Activity   Alcohol use: Yes    Comment: rare   Drug use: No   Sexual activity: Not on file  Other Topics Concern   Not on file  Social History Narrative   Not on file   Social Determinants of Health   Financial Resource Strain: Not on file  Food Insecurity: Not on file  Transportation Needs: Not on file  Physical Activity: Not on file  Stress: Not on file  Social Connections: Not  on file    Review of Systems: A 12 point ROS discussed and pertinent positives are indicated in the HPI above.  All other systems are negative.  Review of Systems  Constitutional:  Negative for activity change, fatigue and fever.  Respiratory:  Negative for cough and shortness of breath.   Gastrointestinal:  Negative for abdominal pain.  Musculoskeletal:  Negative for back pain.  Psychiatric/Behavioral:  Negative for behavioral problems and confusion.    Vital Signs: BP (!) 147/91 (BP Location: Right Arm)    Pulse 77    Temp 98.4 F (36.9 C) (Oral)    Ht 6\' 1"  (1.854  m)    Wt 267 lb (121.1 kg)    SpO2 98%    BMI 35.23 kg/m   Physical Exam Vitals reviewed.  HENT:     Mouth/Throat:     Mouth: Mucous membranes are moist.  Cardiovascular:     Rate and Rhythm: Normal rate and regular rhythm.     Heart sounds: Normal heart sounds.  Pulmonary:     Effort: Pulmonary effort is normal.     Breath sounds: Normal breath sounds.  Abdominal:     Palpations: Abdomen is soft.     Tenderness: There is no abdominal tenderness.  Musculoskeletal:        General: Normal range of motion.  Skin:    General: Skin is warm.  Neurological:     Mental Status: He is alert and oriented to person, place, and time.  Psychiatric:        Behavior: Behavior normal.    Imaging: No results found.  Labs:  CBC: Recent Labs    03/29/20 1536 09/26/20 1052  WBC 7.7 6.4  HGB 13.8 13.1  HCT 39.9 37.9  PLT 228 191    COAGS: No results for input(s): INR, APTT in the last 8760 hours.  BMP: Recent Labs    03/29/20 1536 09/26/20 1052 10/09/20 1025  NA 142 140 139  K 4.3 4.9 4.1  CL 104 103 101  CO2 23 25 25   GLUCOSE 88 126* 96  BUN 16 22 20   CALCIUM 9.6 9.4 9.3  CREATININE 1.44* 2.35* 2.12*  GFRNONAA 54*  --   --   GFRAA 62  --   --     LIVER FUNCTION TESTS: Recent Labs    03/29/20 1536 09/26/20 1052  BILITOT 0.5 0.3  AST 22 22  ALT 22 22  ALKPHOS 75 76  PROT 7.3 7.1  ALBUMIN 4.7 4.5    TUMOR MARKERS: No results for input(s): AFPTM, CEA, CA199, CHROMGRNA in the last 8760 hours.  Assessment and Plan:  Random renal biopsy scheduled today Acute renal failure Follows with Dr Risks and benefits of random renal biopy was discussed with the patient and/or patient's family including, but not limited to bleeding, infection, damage to adjacent structures or low yield requiring additional tests.  All of the questions were answered and there is agreement to proceed.  Consent signed and in chart.   Thank you for this interesting consult.  I  greatly enjoyed meeting [name] [name] [birth year]/20 Jr. and look forward to participating in their care.  A copy of this report was sent to the requesting provider on this date.  Electronically Signed: 09/28/20, PA-C 02/26/2021, 7:33 AM   I spent a total of  30 Minutes   in face to face in clinical consultation, greater than 50% of which was counseling/coordinating care for random renal biopsy

## 2021-02-26 NOTE — Progress Notes (Signed)
Patient was given discharge instructions. He verbalized understanding. 

## 2021-02-27 ENCOUNTER — Ambulatory Visit (HOSPITAL_COMMUNITY)
Admission: RE | Admit: 2021-02-27 | Discharge: 2021-02-27 | Disposition: A | Discharge status: Home / Self Care | Source: Ambulatory Visit | Attending: Nephrology | Admitting: Nephrology

## 2021-02-27 DIAGNOSIS — E6609 Other obesity due to excess calories: Secondary | ICD-10-CM | POA: Insufficient documentation

## 2021-02-27 DIAGNOSIS — D638 Anemia in other chronic diseases classified elsewhere: Secondary | ICD-10-CM | POA: Diagnosis present

## 2021-02-27 DIAGNOSIS — N1832 Chronic kidney disease, stage 3b: Secondary | ICD-10-CM | POA: Diagnosis present

## 2021-02-27 DIAGNOSIS — N170 Acute kidney failure with tubular necrosis: Secondary | ICD-10-CM | POA: Diagnosis not present

## 2021-03-20 ENCOUNTER — Encounter (HOSPITAL_COMMUNITY): Payer: Self-pay

## 2021-03-20 LAB — SURGICAL PATHOLOGY

## 2021-03-29 ENCOUNTER — Encounter: Payer: Self-pay | Admitting: Family Medicine

## 2021-03-29 ENCOUNTER — Ambulatory Visit: Admitting: Family Medicine

## 2021-04-16 ENCOUNTER — Encounter: Payer: Self-pay | Admitting: Family Medicine

## 2021-04-16 ENCOUNTER — Ambulatory Visit (INDEPENDENT_AMBULATORY_CARE_PROVIDER_SITE_OTHER): Admitting: Family Medicine

## 2021-04-16 VITALS — BP 111/62 | HR 66 | Temp 98.2°F | Ht 73.0 in | Wt 269.6 lb

## 2021-04-16 DIAGNOSIS — F5101 Primary insomnia: Secondary | ICD-10-CM | POA: Diagnosis not present

## 2021-04-16 DIAGNOSIS — Z13220 Encounter for screening for lipoid disorders: Secondary | ICD-10-CM | POA: Diagnosis not present

## 2021-04-16 DIAGNOSIS — I10 Essential (primary) hypertension: Secondary | ICD-10-CM | POA: Diagnosis not present

## 2021-04-16 DIAGNOSIS — Z79899 Other long term (current) drug therapy: Secondary | ICD-10-CM

## 2021-04-16 DIAGNOSIS — M109 Gout, unspecified: Secondary | ICD-10-CM | POA: Diagnosis not present

## 2021-04-16 MED ORDER — ESZOPICLONE 3 MG PO TABS: 3.0000 mg | ORAL_TABLET | Freq: Every day | ORAL | 1 refills | Status: DC

## 2021-04-16 MED ORDER — ALLOPURINOL 300 MG PO TABS: 300.0000 mg | ORAL_TABLET | Freq: Every day | ORAL | 3 refills | Status: DC

## 2021-04-16 NOTE — Progress Notes (Signed)
Subjective:  Patient ID: [name] J Iran Jr., male    DOB: 08/17/1965  Age: 56 y.o. MRN: [id number]  CC: Medical Management of Chronic Issues   HPI [name] J Iran Jr. presents for  presents for  follow-up of hypertension. Patient has no history of headache chest pain or shortness of breath or recent cough. Patient also denies symptoms of TIA such as focal numbness or weakness. Patient denies side effects from medication. States taking it regularly.  Not sleeping well in spite of the esopiclone. Would like something stronger. Currently waiting on the [place] for CPAP. Needs a mask that fits better.   Was at [place] 2 weeks ago for Lumbar radiculopathy with pain to the left foot. Work up pending with a couple of tests.   A few tingles from gout. No attack since last check    Depression screen [facility] 2/9 04/16/2021 04/16/2021 09/26/2020  Decreased Interest 0 0 0  Down, Depressed, Hopeless 0 0 0  PHQ - 2 Score 0 0 0  Altered sleeping 1 - 2  Tired, decreased energy 1 - 1  Change in appetite 0 - 0  Feeling bad or failure about yourself  0 - 0  Trouble concentrating 0 - 0  Moving slowly or fidgety/restless 0 - 0  Suicidal thoughts 0 - 0  PHQ-9 Score 2 - 3  Difficult doing work/chores Not difficult at all - Somewhat difficult    History [name] [name] has a past medical history of Gout, Hypertension, Medical history non-contributory, and Wears glasses.   He has a past surgical history that includes Wisdom tooth extraction; Knee arthroscopy with patellar tendon repair (Left, 09/03/2012); and Colonoscopy with propofol (N/A, 08/02/2019).   His family history includes Colon cancer in his mother; Diabetes in his father; Heart attack in his father; Hypertension in his father.He reports that he has never smoked. He has never used smokeless tobacco. He reports current alcohol use. He reports that he does not use drugs.    ROS Review of Systems  Constitutional:  Negative for fever.  Respiratory:  Negative for shortness  of breath.   Cardiovascular:  Negative for chest pain.  Musculoskeletal:  Negative for arthralgias.  Skin:  Negative for rash.  Psychiatric/Behavioral:  Positive for sleep disturbance.    Objective:  BP 111/62    Pulse 66    Temp 98.2 F (36.8 C)    Ht _0  (1.854 m)    Wt 269 lb 9.6 oz (122.3 kg)    SpO2 98%    BMI 35.57 kg/m   BP Readings from Last 3 Encounters:  04/16/21 111/62  02/26/21 (!) 103/45  09/26/20 116/73    Wt Readings from Last 3 Encounters:  04/16/21 269 lb 9.6 oz (122.3 kg)  02/26/21 267 lb (121.1 kg)  09/26/20 273 lb 12.8 oz (124.2 kg)     Physical Exam Constitutional:      General: He is not in acute distress.    Appearance: He is well-developed.  HENT:     Head: Normocephalic and atraumatic.     Right Ear: External ear normal.     Left Ear: External ear normal.     Nose: Nose normal.  Eyes:     Conjunctiva/sclera: Conjunctivae normal.     Pupils: Pupils are equal, round, and reactive to light.  Cardiovascular:     Rate and Rhythm: Normal rate and regular rhythm.     Heart sounds: Normal heart sounds. No murmur heard. Pulmonary:     Effort:  Pulmonary effort is normal. No respiratory distress.     Breath sounds: Normal breath sounds. No wheezing or rales.  Abdominal:     Palpations: Abdomen is soft.     Tenderness: There is no abdominal tenderness.  Musculoskeletal:        General: Normal range of motion.     Cervical back: Normal range of motion and neck supple.  Skin:    General: Skin is warm and dry.  Neurological:     Mental Status: He is alert and oriented to person, place, and time.     Deep Tendon Reflexes: Reflexes are normal and symmetric.  Psychiatric:        Behavior: Behavior normal.        Thought Content: Thought content normal.        Judgment: Judgment normal.      Assessment & Plan:   [name] [name] was seen today for medical management of chronic issues.  Diagnoses and all orders for this visit:  Essential hypertension -      CBC with Differential/Platelet -     CMP14+EGFR  Lipid screening -     Lipid panel  Gout of foot, unspecified cause, unspecified chronicity, unspecified laterality -     allopurinol (ZYLOPRIM) 300 MG tablet; Take 1 tablet (300 mg total) by mouth at bedtime.  Primary insomnia -     Eszopiclone 3 MG TABS; Take 1 tablet (3 mg total) by mouth at bedtime. Take immediately before bedtime  Controlled substance agreement signed -     ToxASSURE [employer] 13 (MW), Urine       I have discontinued [name] J. Iran Jr.'s triamterene-hydrochlorothiazide. I am also having him maintain his acetaminophen, metoprolol succinate, chlorthalidone, tiZANidine, allopurinol, and Eszopiclone.  Allergies as of 04/16/2021       Reactions   Sulfa Antibiotics    Unknown-was told that        Medication List        Accurate as of April 16, 2021  3:22 PM. If you have any questions, ask your nurse or doctor.          STOP taking these medications    triamterene-hydrochlorothiazide 37.5-25 MG tablet Commonly known as: MAXZIDE-25 Stopped by: Claretta Fraise, MD       TAKE these medications    acetaminophen 500 MG tablet Commonly known as: TYLENOL Take 500 mg by mouth every 6 (six) hours as needed for mild pain or headache.   allopurinol 300 MG tablet Commonly known as: ZYLOPRIM Take 1 tablet (300 mg total) by mouth at bedtime.   chlorthalidone 25 MG tablet Commonly known as: HYGROTON Take 12.5 mg by mouth daily.   Eszopiclone 3 MG Tabs Take 1 tablet (3 mg total) by mouth at bedtime. Take immediately before bedtime   metoprolol succinate 100 MG 24 hr tablet Commonly known as: TOPROL-XL TAKE 1 TABLET BY MOUTH EVERY DAY WITH OR IMMEDIATELY FOLLOWING A MEAL   tiZANidine 4 MG tablet Commonly known as: ZANAFLEX Take 4 mg by mouth every 8 (eight) hours as needed for muscle spasms.         Follow-up: Return in about 6 months (around 10/14/2021) for [facility] physical.  Claretta Fraise,  M.D.

## 2021-04-17 LAB — CMP14+EGFR
ALT: 22 IU/L (ref 0–44)
AST: 20 IU/L (ref 0–40)
Albumin/Globulin Ratio: 1.8 (ref 1.2–2.2)
Albumin: 4.6 g/dL (ref 3.8–4.9)
Alkaline Phosphatase: 71 IU/L (ref 44–121)
BUN/Creatinine Ratio: 10 (ref 9–20)
BUN: 20 mg/dL (ref 6–24)
Bilirubin Total: 0.2 mg/dL (ref 0.0–1.2)
CO2: 26 mmol/L (ref 20–29)
Calcium: 9.6 mg/dL (ref 8.7–10.2)
Chloride: 103 mmol/L (ref 96–106)
Creatinine, Ser: 1.93 mg/dL — ABNORMAL HIGH (ref 0.76–1.27)
Globulin, Total: 2.5 g/dL (ref 1.5–4.5)
Glucose: 127 mg/dL — ABNORMAL HIGH (ref 70–99)
Potassium: 4.6 mmol/L (ref 3.5–5.2)
Sodium: 142 mmol/L (ref 134–144)
Total Protein: 7.1 g/dL (ref 6.0–8.5)
eGFR: 40 mL/min/{1.73_m2} — ABNORMAL LOW (ref >59)

## 2021-04-17 LAB — CBC WITH DIFFERENTIAL/PLATELET
Basophils Absolute: 0 10*3/uL (ref 0.0–0.2)
Basos: 0 %
EOS (ABSOLUTE): 0 10*3/uL (ref 0.0–0.4)
Eos: 0 %
Hematocrit: 38.2 % (ref 37.5–51.0)
Hemoglobin: 12.8 g/dL — ABNORMAL LOW (ref 13.0–17.7)
Immature Grans (Abs): 0 10*3/uL (ref 0.0–0.1)
Immature Granulocytes: 0 %
Lymphocytes Absolute: 1.3 10*3/uL (ref 0.7–3.1)
Lymphs: 21 %
MCH: 29 pg (ref 26.6–33.0)
MCHC: 33.5 g/dL (ref 31.5–35.7)
MCV: 87 fL (ref 79–97)
Monocytes Absolute: 0.6 10*3/uL (ref 0.1–0.9)
Monocytes: 9 %
Neutrophils Absolute: 4.5 10*3/uL (ref 1.4–7.0)
Neutrophils: 70 %
Platelets: 230 10*3/uL (ref 150–450)
RBC: 4.41 x10E6/uL (ref 4.14–5.80)
RDW: 13.2 % (ref 11.6–15.4)
WBC: 6.4 10*3/uL (ref 3.4–10.8)

## 2021-04-17 LAB — LIPID PANEL
Chol/HDL Ratio: 4.3 ratio (ref 0.0–5.0)
Cholesterol, Total: 155 mg/dL (ref 100–199)
HDL: 36 mg/dL — ABNORMAL LOW (ref >39)
LDL Chol Calc (NIH): 97 mg/dL (ref 0–99)
Triglycerides: 121 mg/dL (ref 0–149)
VLDL Cholesterol Cal: 22 mg/dL (ref 5–40)

## 2021-04-21 LAB — TOXASSURE SELECT 13 (MW), URINE

## 2021-05-28 ENCOUNTER — Other Ambulatory Visit: Payer: Self-pay | Admitting: Family Medicine

## 2021-05-28 DIAGNOSIS — F5101 Primary insomnia: Secondary | ICD-10-CM

## 2021-08-07 ENCOUNTER — Other Ambulatory Visit (HOSPITAL_COMMUNITY)
Admission: RE | Admit: 2021-08-07 | Discharge: 2021-08-07 | Disposition: A | Discharge status: Home / Self Care | Source: Ambulatory Visit | Attending: Nephrology | Admitting: Nephrology

## 2021-08-07 DIAGNOSIS — N181 Chronic kidney disease, stage 1: Secondary | ICD-10-CM | POA: Insufficient documentation

## 2021-08-07 LAB — CBC
HCT: 38.6 % — ABNORMAL LOW (ref 39.0–52.0)
Hemoglobin: 13 g/dL (ref 13.0–17.0)
MCH: 29.8 pg (ref 26.0–34.0)
MCHC: 33.7 g/dL (ref 30.0–36.0)
MCV: 88.5 fL (ref 80.0–100.0)
Platelets: 249 10*3/uL (ref 150–400)
RBC: 4.36 MIL/uL (ref 4.22–5.81)
RDW: 12.9 % (ref 11.5–15.5)
WBC: 6.2 10*3/uL (ref 4.0–10.5)
nRBC: 0 % (ref 0.0–0.2)

## 2021-08-07 LAB — RENAL FUNCTION PANEL
Albumin: 4.1 g/dL (ref 3.5–5.0)
Anion gap: 7 (ref 5–15)
BUN: 25 mg/dL — ABNORMAL HIGH (ref 6–20)
CO2: 29 mmol/L (ref 22–32)
Calcium: 9.2 mg/dL (ref 8.9–10.3)
Chloride: 106 mmol/L (ref 98–111)
Creatinine, Ser: 2.01 mg/dL — ABNORMAL HIGH (ref 0.61–1.24)
GFR, Estimated: 38 mL/min — ABNORMAL LOW (ref >60)
Glucose, Bld: 106 mg/dL — ABNORMAL HIGH (ref 70–99)
Phosphorus: 3.9 mg/dL (ref 2.5–4.6)
Potassium: 3.7 mmol/L (ref 3.5–5.1)
Sodium: 142 mmol/L (ref 135–145)

## 2021-08-07 LAB — PROTEIN / CREATININE RATIO, URINE
Creatinine, Urine: 239.55 mg/dL
Protein Creatinine Ratio: 0.05 mg/mg{Cre} (ref 0.00–0.15)
Total Protein, Urine: 11 mg/dL

## 2021-08-08 LAB — PTH, INTACT AND CALCIUM
Calcium, Total (PTH): 9.2 mg/dL (ref 8.7–10.2)
PTH: 53 pg/mL (ref 15–65)

## 2021-08-20 ENCOUNTER — Other Ambulatory Visit: Payer: Self-pay | Admitting: Family Medicine

## 2021-10-16 ENCOUNTER — Ambulatory Visit (INDEPENDENT_AMBULATORY_CARE_PROVIDER_SITE_OTHER): Admitting: Family Medicine

## 2021-10-16 ENCOUNTER — Encounter: Payer: Self-pay | Admitting: Family Medicine

## 2021-10-16 VITALS — BP 112/66 | HR 59 | Temp 97.4°F | Ht 73.0 in | Wt 266.4 lb

## 2021-10-16 DIAGNOSIS — E559 Vitamin D deficiency, unspecified: Secondary | ICD-10-CM

## 2021-10-16 DIAGNOSIS — Z125 Encounter for screening for malignant neoplasm of prostate: Secondary | ICD-10-CM

## 2021-10-16 DIAGNOSIS — I10 Essential (primary) hypertension: Secondary | ICD-10-CM | POA: Diagnosis not present

## 2021-10-16 DIAGNOSIS — Z0000 Encounter for general adult medical examination without abnormal findings: Secondary | ICD-10-CM

## 2021-10-16 DIAGNOSIS — E785 Hyperlipidemia, unspecified: Secondary | ICD-10-CM

## 2021-10-16 DIAGNOSIS — Z0001 Encounter for general adult medical examination with abnormal findings: Secondary | ICD-10-CM | POA: Diagnosis not present

## 2021-10-16 DIAGNOSIS — M109 Gout, unspecified: Secondary | ICD-10-CM

## 2021-10-16 MED ORDER — METOPROLOL SUCCINATE ER 100 MG PO TB24: ORAL_TABLET | ORAL | 3 refills | Status: DC

## 2021-10-16 MED ORDER — TRAZODONE HCL 150 MG PO TABS: ORAL_TABLET | ORAL | 5 refills | Status: DC

## 2021-10-16 NOTE — Progress Notes (Signed)
Subjective:  Patient ID: [name] J Guinea-Bissau Jr., male    DOB: 1965/09/30  Age: 56 y.o. MRN: [id number]  CC: Annual Exam   HPI [name] J Guinea-Bissau Jr. presents for annual exam.  Trying to get a new face piece for CPAP. Current one comes off. Esopiclone didn't get approved. Using Tylenol PM instead.    [facility] Doing pain tx with syn visc for left knee. Third shot in 3 days. Helping some with pain. [facility] back pain tx  as well. Decreased some. Some acting up of sciatic nerves. Intermittent pain to 7-8/10. Flares if [name] [name] sits for over an hour. Will see them for the back again after they finish with the knee.      10/16/2021    9:23 AM 10/16/2021    9:16 AM 04/16/2021    2:54 PM  Depression screen PHQ 2/9  Decreased Interest 0 0 0  Down, Depressed, Hopeless 0 0 0  PHQ - 2 Score 0 0 0  Altered sleeping 1  1  Tired, decreased energy 1  1  Change in appetite 0  0  Feeling bad or failure about yourself  0  0  Trouble concentrating 0  0  Moving slowly or fidgety/restless 0  0  Suicidal thoughts 0  0  PHQ-9 Score 2  2  Difficult doing work/chores Not difficult at all  Not difficult at all    History [name] [name] has a past medical history of Gout, Hypertension, Medical history non-contributory, and Wears glasses.   [name] [name] has a past surgical history that includes Wisdom tooth extraction; Knee arthroscopy with patellar tendon repair (Left, 09/03/2012); and Colonoscopy with propofol (N/A, 08/02/2019).   His family history includes Colon cancer in his mother; Diabetes in his father; Heart attack in his father; Hypertension in his father.[name] [name] reports that [name] [name] has never smoked. [name] [name] has never used smokeless tobacco. [name] [name] reports current alcohol use. [name] [name] reports that [name] [name] does not use drugs.    ROS Review of Systems  Constitutional:  Negative for activity change, appetite change, chills, fatigue, fever and unexpected weight change.  HENT:  Positive for rhinorrhea and sinus pressure. Negative for congestion, ear discharge, ear pain,  hearing loss, nosebleeds, postnasal drip, sneezing and trouble swallowing.   Eyes:  Negative for pain and visual disturbance.  Respiratory:  Negative for cough, chest tightness and shortness of breath.   Cardiovascular:  Negative for chest pain, palpitations and leg swelling.  Gastrointestinal:  Negative for abdominal distention, abdominal pain, blood in stool, constipation, diarrhea, nausea and vomiting.  Endocrine: Negative for cold intolerance, heat intolerance and polydipsia.  Genitourinary:  Negative for difficulty urinating, dysuria, flank pain, frequency and urgency.  Musculoskeletal:  Negative for arthralgias and joint swelling.  Skin:  Negative for color change, rash and wound.  Neurological:  Negative for dizziness, syncope, speech difficulty, weakness, light-headedness, numbness and headaches.  Hematological:  Does not bruise/bleed easily.  Psychiatric/Behavioral:  Negative for confusion, decreased concentration, dysphoric mood and sleep disturbance. The patient is not nervous/anxious.     Objective:  BP 112/66   Pulse (!) 59   Temp (!) 97.4 F (36.3 C)   Ht 6\' 1"  (1.854 m)   Wt 266 lb 6.4 oz (120.8 kg)   SpO2 97%   BMI 35.15 kg/m   BP Readings from Last 3 Encounters:  10/16/21 112/66  04/16/21 111/62  02/26/21 (!) 103/45    Wt Readings from Last 3 Encounters:  10/16/21 266 lb 6.4 oz (120.8 kg)  04/16/21 269 lb  9.6 oz (122.3 kg)  02/26/21 267 lb (121.1 kg)     Physical Exam Constitutional:      Appearance: [name] [name] is well-developed.  HENT:     Head: Normocephalic and atraumatic.  Eyes:     Pupils: Pupils are equal, round, and reactive to light.  Neck:     Thyroid: No thyromegaly.     Trachea: No tracheal deviation.  Cardiovascular:     Rate and Rhythm: Normal rate and regular rhythm.     Heart sounds: Normal heart sounds. No murmur heard.    No friction rub. No gallop.  Pulmonary:     Breath sounds: Normal breath sounds. No wheezing or rales.  Abdominal:      General: Bowel sounds are normal. There is no distension.     Palpations: Abdomen is soft. There is no mass.     Tenderness: There is no abdominal tenderness.     Hernia: There is no hernia in the left inguinal area.  Genitourinary:    Penis: Normal.      Testes: Normal.  Musculoskeletal:        General: Normal range of motion.     Cervical back: Normal range of motion.  Lymphadenopathy:     Cervical: No cervical adenopathy.  Skin:    General: Skin is warm and dry.  Neurological:     Mental Status: [name] [name] is alert and oriented to person, place, and time.       Assessment & Plan:   [name] [name] was seen today for annual exam.  Diagnoses and all orders for this visit:  Essential hypertension -     CBC with Differential/Platelet -     CMP14+EGFR  Vitamin D deficiency -     VITAMIN D 25 Hydroxy (Vit-D Deficiency, Fractures)  Hyperlipidemia, unspecified hyperlipidemia type -     Lipid panel  Well adult exam -     CBC with Differential/Platelet -     CMP14+EGFR -     Lipid panel -     PSA, total and free -     VITAMIN D 25 Hydroxy (Vit-D Deficiency, Fractures)  Screening for prostate cancer -     PSA, total and free  Gout of foot, unspecified cause, unspecified chronicity, unspecified laterality -     Uric acid  Other orders -     metoprolol succinate (TOPROL-XL) 100 MG 24 hr tablet; Take with or immediately following a meal. -     traZODone (DESYREL) 150 MG tablet; Use from 1/3 to 1 tablet nightly as needed for sleep.       I have changed [name] J. Iran Jr.'s metoprolol succinate. I am also having him start on traZODone. Additionally, I am having him maintain his acetaminophen, chlorthalidone, tiZANidine, allopurinol, and Eszopiclone.  Allergies as of 10/16/2021       Reactions   Sulfa Antibiotics    Unknown-was told that        Medication List        Accurate as of October 16, 2021  9:56 AM. If you have any questions, ask your nurse or doctor.           acetaminophen 500 MG tablet Commonly known as: TYLENOL Take 500 mg by mouth every 6 (six) hours as needed for mild pain or headache.   allopurinol 300 MG tablet Commonly known as: ZYLOPRIM Take 1 tablet (300 mg total) by mouth at bedtime.   chlorthalidone 25 MG tablet Commonly known as: HYGROTON Take 12.5 mg by mouth  daily.   Eszopiclone 3 MG Tabs Take 1 tablet (3 mg total) by mouth at bedtime. Take immediately before bedtime   metoprolol succinate 100 MG 24 hr tablet Commonly known as: TOPROL-XL Take with or immediately following a meal. What changed: See the new instructions. Changed by: Claretta Fraise, MD   tiZANidine 4 MG tablet Commonly known as: ZANAFLEX Take 4 mg by mouth every 8 (eight) hours as needed for muscle spasms.   traZODone 150 MG tablet Commonly known as: DESYREL Use from 1/3 to 1 tablet nightly as needed for sleep. Started by: Claretta Fraise, MD         Follow-up: Return in about 6 months (around 04/18/2022) for hypertension.  Claretta Fraise, M.D.

## 2021-10-17 LAB — CBC WITH DIFFERENTIAL/PLATELET
Basophils Absolute: 0 10*3/uL (ref 0.0–0.2)
Basos: 1 %
EOS (ABSOLUTE): 0 10*3/uL (ref 0.0–0.4)
Eos: 0 %
Hematocrit: 38.3 % (ref 37.5–51.0)
Hemoglobin: 13.1 g/dL (ref 13.0–17.7)
Immature Grans (Abs): 0 10*3/uL (ref 0.0–0.1)
Immature Granulocytes: 0 %
Lymphocytes Absolute: 0.8 10*3/uL (ref 0.7–3.1)
Lymphs: 15 %
MCH: 30.2 pg (ref 26.6–33.0)
MCHC: 34.2 g/dL (ref 31.5–35.7)
MCV: 88 fL (ref 79–97)
Monocytes Absolute: 0.7 10*3/uL (ref 0.1–0.9)
Monocytes: 13 %
Neutrophils Absolute: 4 10*3/uL (ref 1.4–7.0)
Neutrophils: 71 %
Platelets: 192 10*3/uL (ref 150–450)
RBC: 4.34 x10E6/uL (ref 4.14–5.80)
RDW: 12.6 % (ref 11.6–15.4)
WBC: 5.6 10*3/uL (ref 3.4–10.8)

## 2021-10-17 LAB — VITAMIN D 25 HYDROXY (VIT D DEFICIENCY, FRACTURES): Vit D, 25-Hydroxy: 45 ng/mL (ref 30.0–100.0)

## 2021-10-17 LAB — PSA, TOTAL AND FREE
PSA, Free Pct: 21.7 %
PSA, Free: 0.5 ng/mL
Prostate Specific Ag, Serum: 2.3 ng/mL (ref 0.0–4.0)

## 2021-10-17 LAB — CMP14+EGFR
ALT: 23 IU/L (ref 0–44)
AST: 23 IU/L (ref 0–40)
Albumin/Globulin Ratio: 1.7 (ref 1.2–2.2)
Albumin: 4.5 g/dL (ref 3.8–4.9)
Alkaline Phosphatase: 78 IU/L (ref 44–121)
BUN/Creatinine Ratio: 10 (ref 9–20)
BUN: 21 mg/dL (ref 6–24)
Bilirubin Total: 0.4 mg/dL (ref 0.0–1.2)
CO2: 24 mmol/L (ref 20–29)
Calcium: 9.6 mg/dL (ref 8.7–10.2)
Chloride: 101 mmol/L (ref 96–106)
Creatinine, Ser: 2.03 mg/dL — ABNORMAL HIGH (ref 0.76–1.27)
Globulin, Total: 2.6 g/dL (ref 1.5–4.5)
Glucose: 94 mg/dL (ref 70–99)
Potassium: 4 mmol/L (ref 3.5–5.2)
Sodium: 138 mmol/L (ref 134–144)
Total Protein: 7.1 g/dL (ref 6.0–8.5)
eGFR: 38 mL/min/{1.73_m2} — ABNORMAL LOW (ref >59)

## 2021-10-17 LAB — LIPID PANEL
Chol/HDL Ratio: 4.2 ratio (ref 0.0–5.0)
Cholesterol, Total: 161 mg/dL (ref 100–199)
HDL: 38 mg/dL — ABNORMAL LOW (ref >39)
LDL Chol Calc (NIH): 107 mg/dL — ABNORMAL HIGH (ref 0–99)
Triglycerides: 82 mg/dL (ref 0–149)
VLDL Cholesterol Cal: 16 mg/dL (ref 5–40)

## 2021-10-17 LAB — URIC ACID: Uric Acid: 7.2 mg/dL (ref 3.8–8.4)

## 2021-12-18 ENCOUNTER — Other Ambulatory Visit: Payer: Self-pay | Admitting: Family Medicine

## 2022-03-12 ENCOUNTER — Other Ambulatory Visit: Payer: Self-pay

## 2022-03-12 ENCOUNTER — Inpatient Hospital Stay (HOSPITAL_COMMUNITY)

## 2022-03-12 ENCOUNTER — Encounter (HOSPITAL_COMMUNITY): Payer: Self-pay | Admitting: *Deleted

## 2022-03-12 ENCOUNTER — Emergency Department (HOSPITAL_COMMUNITY)

## 2022-03-12 ENCOUNTER — Inpatient Hospital Stay (HOSPITAL_COMMUNITY)
Admission: EM | Admit: 2022-03-12 | Discharge: 2022-03-17 | DRG: 299 | Disposition: A | Discharge status: Home / Self Care | Attending: Internal Medicine | Admitting: Internal Medicine

## 2022-03-12 DIAGNOSIS — R55 Syncope and collapse: Secondary | ICD-10-CM | POA: Diagnosis present

## 2022-03-12 DIAGNOSIS — Z833 Family history of diabetes mellitus: Secondary | ICD-10-CM

## 2022-03-12 DIAGNOSIS — I2609 Other pulmonary embolism with acute cor pulmonale: Secondary | ICD-10-CM | POA: Diagnosis present

## 2022-03-12 DIAGNOSIS — E669 Obesity, unspecified: Secondary | ICD-10-CM

## 2022-03-12 DIAGNOSIS — N1832 Chronic kidney disease, stage 3b: Secondary | ICD-10-CM | POA: Diagnosis present

## 2022-03-12 DIAGNOSIS — Z9181 History of falling: Secondary | ICD-10-CM

## 2022-03-12 DIAGNOSIS — Z8249 Family history of ischemic heart disease and other diseases of the circulatory system: Secondary | ICD-10-CM

## 2022-03-12 DIAGNOSIS — Z6836 Body mass index (BMI) 36.0-36.9, adult: Secondary | ICD-10-CM | POA: Diagnosis not present

## 2022-03-12 DIAGNOSIS — Z882 Allergy status to sulfonamides status: Secondary | ICD-10-CM

## 2022-03-12 DIAGNOSIS — N183 Chronic kidney disease, stage 3 unspecified: Secondary | ICD-10-CM | POA: Diagnosis present

## 2022-03-12 DIAGNOSIS — Z1152 Encounter for screening for COVID-19: Secondary | ICD-10-CM

## 2022-03-12 DIAGNOSIS — E66812 Obesity, class 2: Secondary | ICD-10-CM

## 2022-03-12 DIAGNOSIS — Z800 Family history of malignant neoplasm of digestive organs: Secondary | ICD-10-CM | POA: Diagnosis not present

## 2022-03-12 DIAGNOSIS — I82412 Acute embolism and thrombosis of left femoral vein: Secondary | ICD-10-CM | POA: Diagnosis present

## 2022-03-12 DIAGNOSIS — I10 Essential (primary) hypertension: Secondary | ICD-10-CM | POA: Diagnosis present

## 2022-03-12 DIAGNOSIS — I82462 Acute embolism and thrombosis of left calf muscular vein: Secondary | ICD-10-CM | POA: Diagnosis present

## 2022-03-12 DIAGNOSIS — I959 Hypotension, unspecified: Secondary | ICD-10-CM | POA: Diagnosis present

## 2022-03-12 DIAGNOSIS — K219 Gastro-esophageal reflux disease without esophagitis: Secondary | ICD-10-CM

## 2022-03-12 DIAGNOSIS — I82432 Acute embolism and thrombosis of left popliteal vein: Secondary | ICD-10-CM | POA: Diagnosis present

## 2022-03-12 DIAGNOSIS — I824Y2 Acute embolism and thrombosis of unspecified deep veins of left proximal lower extremity: Secondary | ICD-10-CM

## 2022-03-12 DIAGNOSIS — I1310 Hypertensive heart and chronic kidney disease without heart failure, with stage 1 through stage 4 chronic kidney disease, or unspecified chronic kidney disease: Secondary | ICD-10-CM | POA: Diagnosis present

## 2022-03-12 DIAGNOSIS — M79605 Pain in left leg: Secondary | ICD-10-CM | POA: Diagnosis present

## 2022-03-12 DIAGNOSIS — E668 Other obesity: Secondary | ICD-10-CM | POA: Diagnosis present

## 2022-03-12 DIAGNOSIS — R0902 Hypoxemia: Secondary | ICD-10-CM

## 2022-03-12 DIAGNOSIS — Z79899 Other long term (current) drug therapy: Secondary | ICD-10-CM | POA: Diagnosis not present

## 2022-03-12 DIAGNOSIS — M109 Gout, unspecified: Secondary | ICD-10-CM | POA: Diagnosis present

## 2022-03-12 DIAGNOSIS — J9601 Acute respiratory failure with hypoxia: Secondary | ICD-10-CM | POA: Diagnosis present

## 2022-03-12 DIAGNOSIS — I82409 Acute embolism and thrombosis of unspecified deep veins of unspecified lower extremity: Secondary | ICD-10-CM | POA: Diagnosis present

## 2022-03-12 LAB — I-STAT CHEM 8, ED
BUN: 31 mg/dL — ABNORMAL HIGH (ref 6–20)
Calcium, Ion: 1.08 mmol/L — ABNORMAL LOW (ref 1.15–1.40)
Chloride: 103 mmol/L (ref 98–111)
Creatinine, Ser: 2.5 mg/dL — ABNORMAL HIGH (ref 0.61–1.24)
Glucose, Bld: 188 mg/dL — ABNORMAL HIGH (ref 70–99)
HCT: 37 % — ABNORMAL LOW (ref 39.0–52.0)
Hemoglobin: 12.6 g/dL — ABNORMAL LOW (ref 13.0–17.0)
Potassium: 4.7 mmol/L (ref 3.5–5.1)
Sodium: 137 mmol/L (ref 135–145)
TCO2: 26 mmol/L (ref 22–32)

## 2022-03-12 LAB — TROPONIN I (HIGH SENSITIVITY)
Troponin I (High Sensitivity): 272 ng/L (ref <18)
Troponin I (High Sensitivity): 419 ng/L (ref <18)
Troponin I (High Sensitivity): 1584 ng/L (ref <18)

## 2022-03-12 LAB — BASIC METABOLIC PANEL
Anion gap: 9 (ref 5–15)
BUN: 25 mg/dL — ABNORMAL HIGH (ref 6–20)
CO2: 24 mmol/L (ref 22–32)
Calcium: 8.4 mg/dL — ABNORMAL LOW (ref 8.9–10.3)
Chloride: 103 mmol/L (ref 98–111)
Creatinine, Ser: 2.23 mg/dL — ABNORMAL HIGH (ref 0.61–1.24)
GFR, Estimated: 33 mL/min — ABNORMAL LOW (ref >60)
Glucose, Bld: 200 mg/dL — ABNORMAL HIGH (ref 70–99)
Potassium: 3.5 mmol/L (ref 3.5–5.1)
Sodium: 136 mmol/L (ref 135–145)

## 2022-03-12 LAB — CBG MONITORING, ED: Glucose-Capillary: 175 mg/dL — ABNORMAL HIGH (ref 70–99)

## 2022-03-12 LAB — CBC
HCT: 40.1 % (ref 39.0–52.0)
Hemoglobin: 13.4 g/dL (ref 13.0–17.0)
MCH: 29.7 pg (ref 26.0–34.0)
MCHC: 33.4 g/dL (ref 30.0–36.0)
MCV: 88.9 fL (ref 80.0–100.0)
Platelets: 126 10*3/uL — ABNORMAL LOW (ref 150–400)
RBC: 4.51 MIL/uL (ref 4.22–5.81)
RDW: 12.9 % (ref 11.5–15.5)
WBC: 11.2 10*3/uL — ABNORMAL HIGH (ref 4.0–10.5)
nRBC: 0 % (ref 0.0–0.2)

## 2022-03-12 LAB — RESP PANEL BY RT-PCR (RSV, FLU A&B, COVID)  RVPGX2
Influenza A by PCR: NEGATIVE
Influenza B by PCR: NEGATIVE
Resp Syncytial Virus by PCR: NEGATIVE
SARS Coronavirus 2 by RT PCR: NEGATIVE

## 2022-03-12 LAB — HEPARIN LEVEL (UNFRACTIONATED): Heparin Unfractionated: 0.65 IU/mL (ref 0.30–0.70)

## 2022-03-12 LAB — D-DIMER, QUANTITATIVE: D-Dimer, Quant: >20 ug/mL-FEU — ABNORMAL HIGH (ref 0.00–0.50)

## 2022-03-12 MED ORDER — CHLORHEXIDINE GLUCONATE CLOTH 2 % EX PADS
6.0000 | MEDICATED_PAD | Freq: Every day | CUTANEOUS | Status: DC
  Administered 2022-03-13 – 2022-03-15 (×3): 6 via TOPICAL

## 2022-03-12 MED ORDER — POLYETHYLENE GLYCOL 3350 17 G PO PACK: 17.0000 g | PACK | Freq: Every day | ORAL | Status: DC | PRN

## 2022-03-12 MED ORDER — HEPARIN (PORCINE) 25000 UT/250ML-% IV SOLN
2400.0000 [IU]/h | INTRAVENOUS | Status: DC
  Administered 2022-03-12: 1900 [IU]/h via INTRAVENOUS
  Administered 2022-03-12: 1800 [IU]/h via INTRAVENOUS
  Administered 2022-03-13 – 2022-03-14 (×2): 1900 [IU]/h via INTRAVENOUS
  Administered 2022-03-15: 2150 [IU]/h via INTRAVENOUS
  Administered 2022-03-15: 2000 [IU]/h via INTRAVENOUS
  Administered 2022-03-16 – 2022-03-17 (×3): 2400 [IU]/h via INTRAVENOUS
  Filled 2022-03-12 (×10): qty 250

## 2022-03-12 MED ORDER — SODIUM CHLORIDE 0.9 % IV SOLN: INTRAVENOUS | Status: AC

## 2022-03-12 MED ORDER — ALLOPURINOL 100 MG PO TABS
300.0000 mg | ORAL_TABLET | Freq: Every day | ORAL | Status: DC
  Administered 2022-03-12 – 2022-03-16 (×5): 300 mg via ORAL
  Filled 2022-03-12: qty 1
  Filled 2022-03-12: qty 3
  Filled 2022-03-12: qty 1
  Filled 2022-03-12: qty 3
  Filled 2022-03-12: qty 1

## 2022-03-12 MED ORDER — ACETAMINOPHEN 325 MG PO TABS
650.0000 mg | ORAL_TABLET | Freq: Four times a day (QID) | ORAL | Status: DC | PRN
  Administered 2022-03-13 – 2022-03-15 (×4): 650 mg via ORAL
  Filled 2022-03-12 (×4): qty 2

## 2022-03-12 MED ORDER — POTASSIUM CHLORIDE CRYS ER 20 MEQ PO TBCR
40.0000 meq | EXTENDED_RELEASE_TABLET | Freq: Once | ORAL | Status: AC
  Administered 2022-03-12: 40 meq via ORAL
  Filled 2022-03-12: qty 2

## 2022-03-12 MED ORDER — ONDANSETRON HCL 4 MG/2ML IJ SOLN: 4.0000 mg | Freq: Four times a day (QID) | INTRAMUSCULAR | Status: DC | PRN

## 2022-03-12 MED ORDER — HEPARIN BOLUS VIA INFUSION
6000.0000 [IU] | Freq: Once | INTRAVENOUS | Status: AC
  Administered 2022-03-12: 6000 [IU] via INTRAVENOUS

## 2022-03-12 MED ORDER — ONDANSETRON HCL 4 MG PO TABS: 4.0000 mg | ORAL_TABLET | Freq: Four times a day (QID) | ORAL | Status: DC | PRN

## 2022-03-12 MED ORDER — ACETAMINOPHEN 650 MG RE SUPP: 650.0000 mg | Freq: Four times a day (QID) | RECTAL | Status: DC | PRN

## 2022-03-12 NOTE — Assessment & Plan Note (Addendum)
Provoked DVT and most likely good-sized PE also.  Presenting with left leg pain, chest pain, syncope, with hypoxia O2 sat 88% on room air currently on 2 L.  Initial reported hypotension systolic 16X resolved without intervention.  Blood pressure systolic now 096E. Troponin 272 > 419.  Recent 4-hour drive 2 days ago. -Left lower extremity venous Dopplers- Positive exam for left femoropopliteal DVT extending into the calf veins. -VQ scan -Echocardiogram - N/s 100cc/hr x 20hrs -Heparin drip started in ED, continue -Bedrest -Trend Troponin -Obtain CT head-with syncope and fall.

## 2022-03-12 NOTE — Progress Notes (Addendum)
ANTICOAGULATION CONSULT NOTE - Initial Consult  Pharmacy Consult for heparin Indication: pulmonary embolus  Allergies  Allergen Reactions   Sulfa Antibiotics     Unknown-was told that    Patient Measurements: Height: 6\' 1"  (185.4 cm) Weight: 120.2 kg (265 lb) IBW/kg (Calculated) : 79.9 Heparin Dosing Weight: 106 kg  Vital Signs: Temp: 97.7 F (36.5 C) (01/02 1237) Temp Source: Oral (01/02 1237) BP: 92/67 (01/02 1300) Pulse Rate: 106 (01/02 1300)  Labs: No results for input(s): "HGB", "HCT", "PLT", "APTT", "LABPROT", "INR", "HEPARINUNFRC", "HEPRLOWMOCWT", "CREATININE", "CKTOTAL", "CKMB", "TROPONINIHS" in the last 72 hours.  CrCl cannot be calculated (Patient's most recent lab result is older than the maximum 21 days allowed.).   Medical History: Past Medical History:  Diagnosis Date   Gout    Hypertension    Medical history non-contributory    Wears glasses     Medications:  (Not in a hospital admission)   Assessment: Pharmacy consulted to dose heparin in patient with submassive pulmonary embolism.  Patient is not on anticoagulation prior to admission.  Goal of Therapy:   Heparin level 0.5-0.7 units/ml Monitor platelets by anticoagulation protocol: Yes   Plan:  Give 6000 units bolus x 1  Start heparin infusion at 1900 units/hr Check anti-Xa level in 6-8 hours and daily Continue to monitor H&H and platelets.   Margot Ables, PharmD Clinical Pharmacist 03/12/2022 1:27 PM

## 2022-03-12 NOTE — Progress Notes (Signed)
Spoke to [employer] in radiology, pt is to have his VQ scan done tomorrow, and there is a note on radiology side stating so.

## 2022-03-12 NOTE — ED Triage Notes (Addendum)
[facility] reports family called EMS for syncope, found pt diaphoretic and low BP 70/50's.  Recent trip to [facility] to and from on Sunday, no hx of DVT.  Pt with CP as well.  Reported that pt had some left leg pain and SOB last night.    Pt states he had  some SOB before passing out, denies at present.  Denies left leg pain at rest currently

## 2022-03-12 NOTE — ED Notes (Signed)
Dr Emokpae at bedside.  

## 2022-03-12 NOTE — H&P (Signed)
History and Physical    [name] J Iran Jr. KVQ:[id number] DOB: 11/20/1965 DOA: 03/12/2022  PCP: Claretta Fraise, MD   Patient coming from: Home  I have personally briefly reviewed patient's old medical records in [place]  Chief Complaint: Difficulty breathing, Chest pain  HPI: [name] J Iran Jr. is a 57 y.o. male with medical history significant for CKD, Gout, HTN.  Patient presented to the ED with complaints of left leg pain, chest pain and difficulty breathing. Patient drove 2 hours on 31st of December to visit his sister in [place], and drove 2 hours back the same day. Symptoms started with pain in his left leg the next day- New Year's Day, without obvious swelling or redness.  Later that evening he started having difficulty breathing ,chest tightness and feeling unwell.  This morning he was standing in the kitchen after eating breakfast with persistent chest pain or difficulty breathing when he passed out.  He fell backwards and likely hit his head, fall was witnessed by his wife. No personal history of blood clots, but family history of blood clots in his father..  Does not smoke cigarettes.    EMS reports blood pressure down to 70/50.  ED Course: Tachycardic heart rate 99-112.  Per report systolic pressure initially in the 90s improved to 120s without intervention.  O2 sats 88% on room air, placed on 2 L.  Respiratory rate 12-21. D-dimer elevated at greater than 20.  Troponin 272 > 419. Left lower extremity venous Doppler positive for femoral-popliteal DVT extending to calf veins.  Chest x-ray without active disease. With GFR of 33, EDP to radiologist, and CTA chest was deferred. EDP talked to intensivist at [facility], okay to admit here at [facility], no indication for thrombolytics.  Review of Systems: As per HPI all other systems reviewed and negative.  Past Medical History:  Diagnosis Date   Gout    Hypertension    Medical history non-contributory    Wears  glasses     Past Surgical History:  Procedure Laterality Date   COLONOSCOPY WITH PROPOFOL N/A 08/02/2019   Procedure: COLONOSCOPY WITH PROPOFOL;  Surgeon: Daneil Dolin, MD;  [facility];  Service: Endoscopy;  Laterality: N/A;  9:15am   KNEE ARTHROSCOPY WITH PATELLAR TENDON REPAIR Left 09/03/2012   Procedure: LEFT KNEE ARTHROSCOPY, LOOSE BODY EXCISION, PATELLAR TENDON REPAIR, EXCISION TIBIAL TUBERCLE SPUR, CHONDROPLASTY PATELLA, EXCISION PLICA;  Surgeon: Ninetta Lights, MD;  Location: [place];  Service: Orthopedics;  Laterality: Left;   WISDOM TOOTH EXTRACTION       reports that he has never smoked. He has never used smokeless tobacco. He reports current alcohol use. He reports that he does not use drugs.  Allergies  Allergen Reactions   Sulfa Antibiotics     Unknown-was told that    Family History  Problem Relation Age of Onset   Colon cancer Mother        Precancerous colon polyps; unsure of actual CRC?   Diabetes Father    Hypertension Father    Heart attack Father    Prior to Admission medications   Medication Sig Start Date End Date Taking? Authorizing Provider  acetaminophen (TYLENOL) 500 MG tablet Take 500 mg by mouth every 6 (six) hours as needed for mild pain or headache.    [provider]  allopurinol (ZYLOPRIM) 300 MG tablet Take 1 tablet (300 mg total) by mouth at bedtime. 04/16/21   Claretta Fraise, MD  chlorthalidone (  HYGROTON) 25 MG tablet Take 12.5 mg by mouth daily.    [provider]  Eszopiclone 3 MG TABS Take 1 tablet (3 mg total) by mouth at bedtime. Take immediately before bedtime 04/16/21   Claretta Fraise, MD  metoprolol succinate (TOPROL-XL) 100 MG 24 hr tablet TAKE 1 TABLET BY MOUTH EVERY DAY WITH OR IMMEDIATELY FOLLOWING A MEAL 12/18/21   Claretta Fraise, MD  tiZANidine (ZANAFLEX) 4 MG tablet Take 4 mg by mouth every 8 (eight) hours as needed for muscle spasms.    [provider]  traZODone (DESYREL)  150 MG tablet Use from 1/3 to 1 tablet nightly as needed for sleep. 10/16/21   Claretta Fraise, MD    Physical Exam: Vitals:   03/12/22 1400 03/12/22 1415 03/12/22 1430 03/12/22 1500  BP: 112/83  (!) 128/96 127/89  Pulse: (!) 112 (!) 110 (!) 109 (!) 110  Resp: 13 12 16 13   Temp:      TempSrc:      SpO2: 100% 100% 97% 97%  Weight:      Height:        Constitutional: NAD, calm, comfortable Vitals:   03/12/22 1400 03/12/22 1415 03/12/22 1430 03/12/22 1500  BP: 112/83  (!) 128/96 127/89  Pulse: (!) 112 (!) 110 (!) 109 (!) 110  Resp: 13 12 16 13   Temp:      TempSrc:      SpO2: 100% 100% 97% 97%  Weight:      Height:       Eyes: PERRL, lids and conjunctivae normal ENMT: Mucous membranes are moist.   Neck: normal, supple, no masses, no thyromegaly Respiratory:  Normal respiratory effort. No accessory muscle use.  Cardiovascular: Tachycardiac, regular rate and rhythm,   No extremity edema.  Extremities warm. Abdomen: no tenderness, no masses palpated. No hepatosplenomegaly. Bowel sounds positive.  Musculoskeletal: no clubbing / cyanosis. No joint deformity upper and lower extremities.  Skin: no rashes, lesions, ulcers. No induration Neurologic: No apparent cranial nerve abnormality, moving extremities spontaneously.  Psychiatric: Normal judgment and insight. Alert and oriented x 3. Normal mood.   Labs on Admission: I have personally reviewed following labs and imaging studies  CBC: Recent Labs  Lab 03/12/22 1306 03/12/22 1335  WBC 11.2*  --   HGB 13.4 12.6*  HCT 40.1 37.0*  MCV 88.9  --   PLT 126*  --    Basic Metabolic Panel: Recent Labs  Lab 03/12/22 1306 03/12/22 1335  NA 136 137  K 3.5 4.7  CL 103 103  CO2 24  --   GLUCOSE 200* 188*  BUN 25* 31*  CREATININE 2.23* 2.50*  CALCIUM 8.4*  --    CBG: Recent Labs  Lab 03/12/22 1242  GLUCAP 175*    Radiological Exams on Admission: US Venous Img Lower Unilateral Left  Result Date: 03/12/2022 CLINICAL DATA:   Left lower extremity pain with shortness of breath, recent car travel EXAM: LEFT LOWER EXTREMITY VENOUS DOPPLER ULTRASOUND TECHNIQUE: Gray-scale sonography with graded compression, as well as color Doppler and duplex ultrasound were performed to evaluate the lower extremity deep venous systems from the level of the common femoral vein and including the common femoral, femoral, profunda femoral, popliteal and calf veins including the posterior tibial, peroneal and gastrocnemius veins when visible. Spectral Doppler was utilized to evaluate flow at rest and with distal augmentation maneuvers in the common femoral, femoral and popliteal veins. COMPARISON:  None Available. FINDINGS: Contralateral Common Femoral Vein: Respiratory phasicity is normal and symmetric  with the symptomatic side. No evidence of thrombus. Normal compressibility. Common Femoral Vein: No evidence of thrombus. Normal compressibility, respiratory phasicity and response to augmentation. Saphenofemoral Junction: No evidence of thrombus. Normal compressibility and flow on color Doppler imaging. Profunda Femoral Vein: No evidence of thrombus. Normal compressibility and flow on color Doppler imaging. Femoral Vein: Diffuse hypoechoic intraluminal thrombus. Vessel noncompressible. Thrombus appears occlusive. No phasic flow. Popliteal Vein: Similar diffuse hypoechoic intraluminal thrombus appearing occlusive. Vessel noncompressible. No phasic flow. Calf Veins: Thrombus does appear to extend into the calf tibial and peroneal veins IMPRESSION: Positive exam for left femoropopliteal DVT extending into the calf veins. Electronically Signed   By: Judie Petit.  Shick M.D.   On: 03/12/2022 14:15   DG Chest Portable 1 View  Result Date: 03/12/2022 CLINICAL DATA:  Chest pain and shortness of breath.  Hypotension. EXAM: PORTABLE CHEST 1 VIEW COMPARISON:  None Available. FINDINGS: The heart size and mediastinal contours are within normal limits. Both lungs are clear. The  visualized skeletal structures are unremarkable. IMPRESSION: No active disease. Electronically Signed   By: Obie Dredge M.D.   On: 03/12/2022 13:10    EKG: Independently reviewed.  Sinus tachycardia rate 101.  QTc 462.  No significant change from prior.  Assessment/Plan Principal Problem:   DVT (deep venous thrombosis) (HCC) Active Problems:   Acute hypoxic respiratory failure (HCC)   Gout   Essential hypertension   CKD (chronic kidney disease) stage 3, GFR 30-59 ml/min (HCC)    Assessment and Plan: * DVT (deep venous thrombosis) (HCC) Provoked DVT and most likely good-sized PE also.  Presenting with left leg pain, chest pain, syncope, with hypoxia O2 sat 88% on room air currently on 2 L.  Initial reported hypotension systolic 90s resolved without intervention.  Blood pressure systolic now 120s. Troponin 272 > 419.  Recent 4-hour drive 2 days ago. -Left lower extremity venous Dopplers- Positive exam for left femoropopliteal DVT extending into the calf veins. -VQ scan -Echocardiogram - N/s 100cc/hr x 20hrs -Heparin drip started in ED, continue -Bedrest -Trend Troponin -Obtain CT head-with syncope and fall.  Acute hypoxic respiratory failure (HCC) O2 sat 88% on room air, likely 2/2 to thromboembolism.  CKD (chronic kidney disease) stage 3, GFR 30-59 ml/min (HCC) CKD stage IIIb.  Creatinine 2.2.  At baseline.  Essential hypertension Currently stable. -Hold chlorthalidone, metoprolol in the setting of likely acute PE  Gout Resume allopurinol   DVT prophylaxis: Heparin Code Status: FULL Family Communication: Spouse at bedside Disposition Plan: >2  days Consults called: None Admission status: Inpt stepdown I certify that at the point of admission it is my clinical judgment that the patient will require inpatient hospital care spanning beyond 2 midnights from the point of admission due to high intensity of service, high risk for further deterioration and high frequency of  surveillance required.    Author: Onnie Boer, MD 03/12/2022 5:07 PM  For on call review [email or web address].

## 2022-03-12 NOTE — Assessment & Plan Note (Signed)
O2 sat 88% on room air, likely 2/2 to thromboembolism.

## 2022-03-12 NOTE — Assessment & Plan Note (Addendum)
-  CKD stage IIIb.   -Appears to be at baseline currently. -Continue to maintain adequate hydration, continue to minimize nephrotoxic agents. -Follow renal function trend. -Creatinine currently 1.88 and GFR of 41.

## 2022-03-12 NOTE — ED Provider Notes (Signed)
[place] Provider Note   CSN: [id number] Arrival date & time: 03/12/22  1226     History {Add pertinent medical, surgical, social history, OB history to HPI:1} Chief Complaint  Patient presents with   Loss of Consciousness    Kedar J Iran Jr. is a 57 y.o. male.  ***Shortness of breath that started last night.  Also with chest pressure that feels like someone is sitting on his chest.  Not pleuritic is exertional.  No vomiting.  No diaphoresis.  Has developed a cough today.  No fevers.  No symptoms prior to this except for left leg pain.  No history of DVT or PE.  No history of cancer.  No recent surgeries.  No history of GI bleeds.       Home Medications Prior to Admission medications   Medication Sig Start Date End Date Taking? Authorizing Provider  acetaminophen (TYLENOL) 500 MG tablet Take 500 mg by mouth every 6 (six) hours as needed for mild pain or headache.    [provider]  allopurinol (ZYLOPRIM) 300 MG tablet Take 1 tablet (300 mg total) by mouth at bedtime. 04/16/21   Claretta Fraise, MD  chlorthalidone (HYGROTON) 25 MG tablet Take 12.5 mg by mouth daily.    [provider]  Eszopiclone 3 MG TABS Take 1 tablet (3 mg total) by mouth at bedtime. Take immediately before bedtime 04/16/21   Claretta Fraise, MD  metoprolol succinate (TOPROL-XL) 100 MG 24 hr tablet TAKE 1 TABLET BY MOUTH EVERY DAY WITH OR IMMEDIATELY FOLLOWING A MEAL 12/18/21   Claretta Fraise, MD  tiZANidine (ZANAFLEX) 4 MG tablet Take 4 mg by mouth every 8 (eight) hours as needed for muscle spasms.    [provider]  traZODone (DESYREL) 150 MG tablet Use from 1/3 to 1 tablet nightly as needed for sleep. 10/16/21   Claretta Fraise, MD      Allergies    Sulfa antibiotics    Review of Systems   Review of Systems  Physical Exam Updated Vital Signs BP 92/67   Pulse (!) 106   Temp 97.7 F (36.5 C) (Oral)   Resp 16   Ht 6\' 1"  (1.854 m)   Wt 120.2 kg   SpO2 96%    BMI 34.96 kg/m  Physical Exam  ED Results / Procedures / Treatments   Labs (all labs ordered are listed, but only abnormal results are displayed) Labs Reviewed  CBG MONITORING, ED - Abnormal; Notable for the following components:      Result Value   Glucose-Capillary 175 (*)    All other components within normal limits  RESP PANEL BY RT-PCR (RSV, FLU A&B, COVID)  RVPGX2  BASIC METABOLIC PANEL  CBC  URINALYSIS, ROUTINE W REFLEX MICROSCOPIC  D-DIMER, QUANTITATIVE  I-STAT CHEM 8, ED  TROPONIN I (HIGH SENSITIVITY)    EKG EKG Interpretation  Date/Time:  Tuesday March 12 2022 12:36:58 EST Ventricular Rate:  101 PR Interval:  161 QRS Duration: 120 QT Interval:  356 QTC Calculation: 462 R Axis:   57 Text Interpretation: Sinus tachycardia IVCD, consider atypical RBBB Inferior infarct, age indeterminate Confirmed by Margaretmary Eddy [phone]) on 03/12/2022 1:10:32 PM  Radiology DG Chest Portable 1 View  Result Date: 03/12/2022 CLINICAL DATA:  Chest pain and shortness of breath.  Hypotension. EXAM: PORTABLE CHEST 1 VIEW COMPARISON:  None Available. FINDINGS: The heart size and mediastinal contours are within normal limits. Both lungs are clear. The visualized skeletal structures are unremarkable. IMPRESSION: No active  disease. Electronically Signed   By: Titus Dubin M.D.   On: 03/12/2022 13:10    Procedures Procedures  {Document cardiac monitor, telemetry assessment procedure when appropriate:1}  Medications Ordered in ED Medications - No data to display  ED Course/ Medical Decision Making/ A&P                           Medical Decision Making Amount and/or Complexity of Data Reviewed Labs: ordered. Radiology: ordered.   ***  {Document critical care time when appropriate:1} {Document review of labs and clinical decision tools ie heart score, Chads2Vasc2 etc:1}  {Document your independent review of radiology images, and any outside records:1} {Document your  discussion with family members, caretakers, and with consultants:1} {Document social determinants of health affecting pt's care:1} {Document your decision making why or why not admission, treatments were needed:1} Final Clinical Impression(s) / ED Diagnoses Final diagnoses:  None    Rx / DC Orders ED Discharge Orders     None

## 2022-03-12 NOTE — ED Notes (Signed)
Patient transported to CT 

## 2022-03-12 NOTE — Progress Notes (Signed)
ANTICOAGULATION CONSULT NOTE - Follow Up Consult  Pharmacy Consult for heparin Indication: pulmonary embolus  Allergies  Allergen Reactions   Sulfa Antibiotics     Unknown-was told that    Patient Measurements: Height: 6\' 1"  (185.4 cm) Weight: 120.3 kg (265 lb 3.4 oz) IBW/kg (Calculated) : 79.9 Heparin Dosing Weight: 106 kg  Vital Signs: Temp: 98.5 F (36.9 C) (01/02 1800) Temp Source: Oral (01/02 1658) BP: 133/91 (01/02 2100) Pulse Rate: 108 (01/02 2100)  Labs: Recent Labs    03/12/22 1306 03/12/22 1335 03/12/22 1509 03/12/22 1934  HGB 13.4 12.6*  --   --   HCT 40.1 37.0*  --   --   PLT 126*  --   --   --   HEPARINUNFRC  --   --   --  0.65  CREATININE 2.23* 2.50*  --   --   TROPONINIHS 272*  --  419*  --     Estimated Creatinine Clearance: 43.8 mL/min (A) (by C-G formula based on SCr of 2.5 mg/dL (H)).   Medications:  Scheduled:   allopurinol  300 mg Oral QHS   [START ON 03/13/2022] Chlorhexidine Gluconate Cloth  6 each Topical Q0600    Assessment: Pharmacy consulted to dose heparin in patient with submassive pulmonary embolism.  Patient is not on anticoagulation prior to admission. Heparin therapeutic 0.65   Goal of Therapy:  Heparin level goal of 0.5-0.7  Monitor platelets by anticoagulation protocol: Yes   Plan:  Continue heparin infusion at 1900 units/hr Check anti-Xa level in 6 hours and daily Continue to monitor H&H and platelets.  Blenda Nicely, [place] Clinical Pharmacist 03/12/2022,9:12 PM

## 2022-03-12 NOTE — ED Notes (Signed)
Date and time results received: 03/12/22 1645 (use smartphrase ".now" to insert current time)  Test: troponin  Critical Value: 419  Name of Provider Notified: Dr. Denton Brick  Orders Received? Or Actions Taken?: Actions Taken: continue to follow trend and monitor

## 2022-03-12 NOTE — Assessment & Plan Note (Addendum)
-  Continue allopurinol -Concerns of acute flare affecting left ankle; patient will be treated with prednisone.

## 2022-03-12 NOTE — ED Notes (Signed)
ED Provider at bedside. 

## 2022-03-12 NOTE — Assessment & Plan Note (Addendum)
Currently stable. -Hold chlorthalidone, metoprolol in the setting of likely acute PE

## 2022-03-12 NOTE — Progress Notes (Signed)
2329 Msg sent to Dr. Josephine Cables for 3rd troponin 1584, waiting response.

## 2022-03-13 ENCOUNTER — Inpatient Hospital Stay (HOSPITAL_COMMUNITY)

## 2022-03-13 ENCOUNTER — Encounter (HOSPITAL_COMMUNITY): Payer: Self-pay | Admitting: Internal Medicine

## 2022-03-13 DIAGNOSIS — N1832 Chronic kidney disease, stage 3b: Secondary | ICD-10-CM | POA: Diagnosis not present

## 2022-03-13 DIAGNOSIS — I2609 Other pulmonary embolism with acute cor pulmonale: Secondary | ICD-10-CM | POA: Diagnosis not present

## 2022-03-13 DIAGNOSIS — I82412 Acute embolism and thrombosis of left femoral vein: Secondary | ICD-10-CM

## 2022-03-13 DIAGNOSIS — M109 Gout, unspecified: Secondary | ICD-10-CM

## 2022-03-13 DIAGNOSIS — J9601 Acute respiratory failure with hypoxia: Secondary | ICD-10-CM | POA: Diagnosis not present

## 2022-03-13 LAB — ECHOCARDIOGRAM COMPLETE
Area-P 1/2: 4.17 cm2
Height: 73 in
MV M vel: 0.71 m/s
MV Peak grad: 2 mmHg
S' Lateral: 2.5 cm
Weight: 4373.93 oz

## 2022-03-13 LAB — BASIC METABOLIC PANEL
Anion gap: 7 (ref 5–15)
BUN: 21 mg/dL — ABNORMAL HIGH (ref 6–20)
CO2: 24 mmol/L (ref 22–32)
Calcium: 8.1 mg/dL — ABNORMAL LOW (ref 8.9–10.3)
Chloride: 106 mmol/L (ref 98–111)
Creatinine, Ser: 1.91 mg/dL — ABNORMAL HIGH (ref 0.61–1.24)
GFR, Estimated: 40 mL/min — ABNORMAL LOW (ref >60)
Glucose, Bld: 120 mg/dL — ABNORMAL HIGH (ref 70–99)
Potassium: 4 mmol/L (ref 3.5–5.1)
Sodium: 137 mmol/L (ref 135–145)

## 2022-03-13 LAB — HEPARIN LEVEL (UNFRACTIONATED)
Heparin Unfractionated: 0.66 IU/mL (ref 0.30–0.70)
Heparin Unfractionated: 0.58 IU/mL (ref 0.30–0.70)

## 2022-03-13 LAB — HIV ANTIBODY (ROUTINE TESTING W REFLEX): HIV Screen 4th Generation wRfx: NONREACTIVE

## 2022-03-13 LAB — TROPONIN I (HIGH SENSITIVITY)
Troponin I (High Sensitivity): 482 ng/L (ref <18)
Troponin I (High Sensitivity): 488 ng/L (ref <18)

## 2022-03-13 LAB — MRSA NEXT GEN BY PCR, NASAL: MRSA by PCR Next Gen: NOT DETECTED

## 2022-03-13 LAB — CBC
HCT: 34.9 % — ABNORMAL LOW (ref 39.0–52.0)
Hemoglobin: 11.5 g/dL — ABNORMAL LOW (ref 13.0–17.0)
MCH: 29.6 pg (ref 26.0–34.0)
MCHC: 33 g/dL (ref 30.0–36.0)
MCV: 89.7 fL (ref 80.0–100.0)
Platelets: 135 10*3/uL — ABNORMAL LOW (ref 150–400)
RBC: 3.89 MIL/uL — ABNORMAL LOW (ref 4.22–5.81)
RDW: 13 % (ref 11.5–15.5)
WBC: 9.2 10*3/uL (ref 4.0–10.5)
nRBC: 0 % (ref 0.0–0.2)

## 2022-03-13 MED ORDER — ORAL CARE MOUTH RINSE: 15.0000 mL | OROMUCOSAL | Status: DC | PRN

## 2022-03-13 MED ORDER — TECHNETIUM TO 99M ALBUMIN AGGREGATED
4.0000 | Freq: Once | INTRAVENOUS | Status: AC | PRN
  Administered 2022-03-13: 4.4 via INTRAVENOUS

## 2022-03-13 MED ORDER — PERFLUTREN LIPID MICROSPHERE
1.0000 mL | INTRAVENOUS | Status: AC | PRN
  Administered 2022-03-13: 4 mL via INTRAVENOUS

## 2022-03-13 NOTE — Progress Notes (Signed)
  Transition of Care [facility]) Screening Note   Patient Details  Name: [name] [name]. Date of Birth: 01/29/1966   [facility] of [facility]) CM/SW Contact:    Ihor Gully, LCSW Phone Number: 03/13/2022, 2:31 PM    Transition of Care Department [facility]) has reviewed patient and no TOC needs have been identified at this time. We will continue to monitor patient advancement through interdisciplinary progression rounds. If new patient transition needs arise, please place a TOC consult.

## 2022-03-13 NOTE — Progress Notes (Signed)
  Echocardiogram 2D Echocardiogram has been performed.  [name] [name] 03/13/2022, 10:43 AM

## 2022-03-13 NOTE — Progress Notes (Signed)
Progress Note   Patient: [name] J Iran Jr. XHB:[id number] DOB: 03/30/1965 DOA: 03/12/2022     1 DOS: the patient was seen and examined on 03/13/2022   Brief hospital course: 57 year old male with medical history significant of chronic kidney disease (stage IIIb), hypertension and gout; who presented to the hospital secondary to left leg pain, chest pain, difficulty breathing and an episode of passing out.  Workup positive for left lower extremity DVT extending from femoral- popliteal to calf veins.  VQ scan also positive for pulmonary embolism.  At time of admission blood pressure was soft.  Assessment and Plan: * DVT (deep venous thrombosis) (HCC) -Provoked DVT and with component of pulmonary embolism causing right heart strain seen on 2D echo.   -Blood pressure has now stabilized and patient demonstrating good oxygen saturation; complaining of chills chest pressure.   -Case has been discussed with pulmonologist (Dr. Melvyn Novas); recommendations given to continue heparin drip for a total of 5 days prior to transition to oral anticoagulation therapy. -Holding on tPA currently.    Acute hypoxic respiratory failure (HCC) -O2 sat 88% on room air, likely 2/2 to thromboembolism. -Oxygen saturation 96-97 % on 2 L supplementation. -Patient is present no shortness of breath currently.  CKD (chronic kidney disease) stage 3, GFR 30-59 ml/min (HCC) -CKD stage IIIb.   -Appears to be at baseline currently. -Continue to maintain adequate hydration, minimize nephrotoxic agents.  Essential hypertension -Currently stable. -New holding chlorthalidone and metoprolol in the setting of acute PE -Follow-up vital signs. -Heart healthy diet discussed with patient.  Gout -Continue allopurinol -No acute flare appreciated.     Subjective:  Afebrile, no nausea or vomiting; breathing stable.  Expressing some chest pressure discomfort.  Physical Exam: Vitals:   03/13/22 1400 03/13/22 1500 03/13/22 1600 03/13/22  1604  BP: (!) 145/87 (!) 127/96 (!) 157/101   Pulse: 96 95 100   Resp: (!) 28 (!) 26 19   Temp:    98.8 F (37.1 C)  TempSrc:    Oral  SpO2: 97% 96% 99%   Weight:      Height:       General exam: Alert, awake, oriented x 3; no nausea, no vomiting and demonstrating stable saturation with 2 L supplementation.  Patient reports some chest pressure discomfort. Respiratory system: No wheezing, no crackles appreciated ; No using accessory muscles. Cardiovascular system:RRR. No rubs or gallops; no JVD. Gastrointestinal system: Abdomen is nondistended, soft and nontender. No organomegaly or masses felt. Normal bowel sounds heard. Central nervous system: Alert and oriented. No focal neurological deficits. Extremities: No cyanosis or clubbing. Skin: No petechiae. Psychiatry: Judgement and insight appear normal. Mood & affect appropriate.   Data Reviewed: CBC: WBCs 9.2, hemoglobin 11.5 and platelet count 810 K Basic metabolic panel:Sodium 175, potassium 4.0, chloride 106, bicarb 24, BUN 21 and creatinine 1.91; GFR more than 40.  2D echo:  1. Left ventricular ejection fraction, by estimation, is 60 to 65%. The  left ventricle has normal function. The left ventricle has no regional  wall motion abnormalities. Left ventricular diastolic parameters were  normal. There is the interventricular  septum is flattened in systole and diastole, consistent with right  ventricular pressure and volume overload.   2. Right ventricular systolic function is severely reduced with  McConnell's sign consistent with right ventricular strain in the setting  of pulmonary embolus. The right ventricular size is mildly enlarged.  Tricuspid regurgitation signal is inadequate  for assessing PA pressure.   3. Right atrial size  was mildly dilated.   4. The mitral valve is grossly normal. Trivial mitral valve  regurgitation.   5. The aortic valve is tricuspid. Aortic valve regurgitation is not  visualized.   6. Aortic  dilatation noted. There is mild dilatation of the ascending  aorta, measuring 36 mm.   7. The inferior vena cava is normal in size with greater than 50%  respiratory variability, suggesting right atrial pressure of 3 mmHg.    Family Communication: Wife and son at bedside.  Disposition: Status is: Inpatient Remains inpatient appropriate because: Continue treatment with IV anticoagulation in the setting of acute lower extremity DVT and PE.   Planned Discharge Destination: Home   Time spent: 50 minutes  Author: Barton Dubois, MD 03/13/2022 5:41 PM  For on call review [email or web address].

## 2022-03-13 NOTE — Progress Notes (Signed)
ANTICOAGULATION CONSULT NOTE - Follow Up Consult  Pharmacy Consult for heparin Indication: pulmonary embolus  Allergies  Allergen Reactions   Sulfa Antibiotics     Unknown-was told that    Patient Measurements: Height: 6\' 1"  (185.4 cm) Weight: 120.3 kg (265 lb 3.4 oz) IBW/kg (Calculated) : 79.9 Heparin Dosing Weight: 106 kg  Vital Signs: Temp: 99.5 F (37.5 C) (01/02 2328) Temp Source: Oral (01/02 2328) BP: 119/87 (01/03 0300) Pulse Rate: 101 (01/03 0300)  Labs: Recent Labs    03/12/22 1306 03/12/22 1335 03/12/22 1509 03/12/22 1934 03/12/22 2135 03/13/22 0138  HGB 13.4 12.6*  --   --   --   --   HCT 40.1 37.0*  --   --   --   --   PLT 126*  --   --   --   --   --   HEPARINUNFRC  --   --   --  0.65  --  0.66  CREATININE 2.23* 2.50*  --   --   --   --   TROPONINIHS 272*  --  419*  --  1,584*  --      Estimated Creatinine Clearance: 43.8 mL/min (A) (by C-G formula based on SCr of 2.5 mg/dL (H)).   Medications:  Scheduled:   allopurinol  300 mg Oral QHS   Chlorhexidine Gluconate Cloth  6 each Topical Q0600    Assessment: Pharmacy consulted to dose heparin in patient with submassive pulmonary embolism.  Patient is not on anticoagulation prior to admission. Heparin therapeutic 0.65  1/3 AM update:  Heparin level therapeutic x 2   Goal of Therapy:  Heparin level goal of 0.5-0.7  Monitor platelets by anticoagulation protocol: Yes   Plan:  Cont heparin 1900 units/hr Daily CBC and heparin level Monitor for bleeding  Narda Bonds, PharmD, BCPS Clinical Pharmacist Phone: [phone]

## 2022-03-14 ENCOUNTER — Other Ambulatory Visit (HOSPITAL_COMMUNITY): Payer: Self-pay

## 2022-03-14 DIAGNOSIS — J9601 Acute respiratory failure with hypoxia: Secondary | ICD-10-CM | POA: Diagnosis not present

## 2022-03-14 DIAGNOSIS — N1832 Chronic kidney disease, stage 3b: Secondary | ICD-10-CM | POA: Diagnosis not present

## 2022-03-14 DIAGNOSIS — I2609 Other pulmonary embolism with acute cor pulmonale: Secondary | ICD-10-CM | POA: Diagnosis not present

## 2022-03-14 DIAGNOSIS — I82412 Acute embolism and thrombosis of left femoral vein: Secondary | ICD-10-CM | POA: Diagnosis not present

## 2022-03-14 LAB — CBC
HCT: 37.9 % — ABNORMAL LOW (ref 39.0–52.0)
Hemoglobin: 12.8 g/dL — ABNORMAL LOW (ref 13.0–17.0)
MCH: 29.8 pg (ref 26.0–34.0)
MCHC: 33.8 g/dL (ref 30.0–36.0)
MCV: 88.3 fL (ref 80.0–100.0)
Platelets: 136 10*3/uL — ABNORMAL LOW (ref 150–400)
RBC: 4.29 MIL/uL (ref 4.22–5.81)
RDW: 13 % (ref 11.5–15.5)
WBC: 8.1 10*3/uL (ref 4.0–10.5)
nRBC: 0 % (ref 0.0–0.2)

## 2022-03-14 LAB — HEPARIN LEVEL (UNFRACTIONATED): Heparin Unfractionated: 0.47 IU/mL (ref 0.30–0.70)

## 2022-03-14 NOTE — TOC Benefit Eligibility Note (Signed)
Patient Teacher, English as a foreign language completed.    The patient is currently admitted and upon discharge could be taking Eliquis 5 mg.  The current 30 day co-pay is $43.00.   The patient is currently admitted and upon discharge could be taking Xarelto 20 mg.  The current 30 day co-pay is $43.00.   The patient is insured through [place] ([place] [place] [facility] that takes tricare)   [name], [place] Patient Advocate Specialist [place] Patient Advocate Team Direct Number: [phone]  Fax: [phone]

## 2022-03-14 NOTE — Progress Notes (Signed)
ANTICOAGULATION CONSULT NOTE - Follow Up Consult  Pharmacy Consult for heparin Indication: pulmonary embolus  Allergies  Allergen Reactions   Sulfa Antibiotics     Unknown-was told that    Patient Measurements: Height: 6\' 1"  (185.4 cm) Weight: 124 kg (273 lb 5.9 oz) IBW/kg (Calculated) : 79.9 Heparin Dosing Weight: 106 kg  Vital Signs: Temp: 98.4 F (36.9 C) (01/04 0724) Temp Source: Oral (01/04 0724) BP: 132/77 (01/04 0800) Pulse Rate: 91 (01/04 0800)  Labs: Recent Labs    03/12/22 1306 03/12/22 1335 03/12/22 1509 03/12/22 2135 03/13/22 0138 03/13/22 0504 03/13/22 1902 03/13/22 2023 03/14/22 0515  HGB 13.4 12.6*  --   --   --  11.5*  --   --  12.8*  HCT 40.1 37.0*  --   --   --  34.9*  --   --  37.9*  PLT 126*  --   --   --   --  135*  --   --  136*  HEPARINUNFRC  --   --    < >  --  0.66 0.58  --   --  0.47  CREATININE 2.23* 2.50*  --   --   --  1.91*  --   --   --   TROPONINIHS 272*  --    < > 1,584*  --   --  482* 488*  --    < > = values in this interval not displayed.     Estimated Creatinine Clearance: 58.1 mL/min (A) (by C-G formula based on SCr of 1.91 mg/dL (H)).   Medications:  Scheduled:   allopurinol  300 mg Oral QHS   Chlorhexidine Gluconate Cloth  6 each Topical Q0600    Assessment: Pharmacy consulted to dose heparin in patient with submassive pulmonary embolism.  Patient is not on anticoagulation prior to admission.  Heparinlevel 0.66> 0.58> 0.47, goal 0.5-0.7. Just slightly below goal. Adjust   Goal of Therapy:  Heparin level goal of 0.5-0.7  Monitor platelets by anticoagulation protocol: Yes   Plan:  Increase heparin 2000 units/hr Daily CBC and heparin level Monitor for bleeding  Isac Sarna, BS Pharm D, BCPS Clinical Pharmacist

## 2022-03-14 NOTE — Progress Notes (Signed)
Progress Note   Patient: [name] J Iran Jr. YQM:[id number] DOB: [birth year] DOA: 03/12/2022     2 DOS: the patient was seen and examined on 03/14/2022   Brief hospital course: 57 year old male with medical history significant of chronic kidney disease (stage IIIb), hypertension and gout; who presented to the hospital secondary to left leg pain, chest pain, difficulty breathing and an episode of passing out.  Workup positive for left lower extremity DVT extending from femoral- popliteal to calf veins.  VQ scan also positive for pulmonary embolism.  At time of admission blood pressure was soft.  Assessment and Plan: * DVT (deep venous thrombosis) (HCC) -Provoked DVT and with component of pulmonary embolism causing right heart strain seen on 2D echo.   -Blood pressure has now stabilized and patient demonstrating good oxygen saturation; complaining of chills chest pressure.   -Case has been discussed with pulmonologist (Dr. Melvyn Novas); recommendations given to continue heparin drip for a total of 5 days prior to transition to oral anticoagulation therapy. -Holding on tPA currently following PCCM recommendations..    Acute hypoxic respiratory failure (HCC) -O2 sat 88% on room air, likely 2/2 to thromboembolism. -Oxygen saturation and breathing symptoms has significantly improved and currently not requiring oxygen supplementation at rest. -Patient is now complaining of difficulty breathing coronary.  CKD (chronic kidney disease) stage 3, GFR 30-59 ml/min (HCC) -CKD stage IIIb.   -Appears to be at baseline currently. -Continue to maintain adequate hydration, continue to minimize nephrotoxic agents. -Follow renal function trend.  Essential hypertension -Currently stable. -continue holding chlorthalidone and metoprolol in the setting of acute PE; with plans to start resuming antihypertensive agents on 03/15/2022 -Follow-up vital signs. -Heart healthy diet discussed with patient.  Gout -Continue  allopurinol -No acute flare appreciated.    Class II obesity -Low-calorie diet, portion control and increase physical activity discussed with patient. -Body mass index is 36.07 kg/m.  Subjective:  Afebrile, no chest pain, no nausea, no vomiting.  Reports breathing is a stable and is no longer requiring oxygen supplementation.  Stable blood pressure appreciated.  Physical Exam: Vitals:   03/14/22 1200 03/14/22 1300 03/14/22 1400 03/14/22 1558  BP: (!) 170/102 (!) 166/91 (!) 146/102   Pulse: 93 93 97   Resp: (!) 25 (!) 23 (!) 22   Temp:    98.7 F (37.1 C)  TempSrc:    Oral  SpO2: 98% 97% 99%   Weight:      Height:       General exam: Alert, awake, oriented x 3; reporting feeling much better, denying chest pain, no nausea, no vomiting, breathing without difficulties with good saturation on room air. Respiratory system: Positive rhonchi; no wheezing, no crackles. Cardiovascular system: Regular rate and rhythm; no rubs, no gallops, no JVD. Gastrointestinal system: Abdomen is obese, nondistended, soft and nontender. No organomegaly or masses felt. Normal bowel sounds heard. Central nervous system: Alert and oriented. No focal neurological deficits. Extremities: No cyanosis or clubbing. Skin: No petechiae. Psychiatry: Judgement and insight appear normal. Mood & affect appropriate.   Data Reviewed: CBC: WBCs 8.1, hemoglobin 12.8 and platelet count 136 K  2D echo:  1. Left ventricular ejection fraction, by estimation, is 60 to 65%. The  left ventricle has normal function. The left ventricle has no regional  wall motion abnormalities. Left ventricular diastolic parameters were  normal. There is the interventricular  septum is flattened in systole and diastole, consistent with right  ventricular pressure and volume overload.   2. Right ventricular systolic function  is severely reduced with  McConnell's sign consistent with right ventricular strain in the setting  of pulmonary  embolus. The right ventricular size is mildly enlarged.  Tricuspid regurgitation signal is inadequate  for assessing PA pressure.   3. Right atrial size was mildly dilated.   4. The mitral valve is grossly normal. Trivial mitral valve  regurgitation.   5. The aortic valve is tricuspid. Aortic valve regurgitation is not  visualized.   6. Aortic dilatation noted. There is mild dilatation of the ascending  aorta, measuring 36 mm.   7. The inferior vena cava is normal in size with greater than 50%  respiratory variability, suggesting right atrial pressure of 3 mmHg.    Family Communication: Wife and son at bedside.  Disposition: Status is: Inpatient Remains inpatient appropriate because: Continue treatment with IV anticoagulation in the setting of acute lower extremity DVT and PE.   Planned Discharge Destination: Home   Time spent: 50 minutes  Author: Barton Dubois, MD 03/14/2022 5:11 PM  For on call review [email or web address].

## 2022-03-15 ENCOUNTER — Inpatient Hospital Stay (HOSPITAL_COMMUNITY)

## 2022-03-15 DIAGNOSIS — I82412 Acute embolism and thrombosis of left femoral vein: Secondary | ICD-10-CM | POA: Diagnosis not present

## 2022-03-15 DIAGNOSIS — J9601 Acute respiratory failure with hypoxia: Secondary | ICD-10-CM | POA: Diagnosis not present

## 2022-03-15 DIAGNOSIS — I2609 Other pulmonary embolism with acute cor pulmonale: Secondary | ICD-10-CM | POA: Diagnosis not present

## 2022-03-15 DIAGNOSIS — N1832 Chronic kidney disease, stage 3b: Secondary | ICD-10-CM | POA: Diagnosis not present

## 2022-03-15 LAB — CBC
HCT: 36.3 % — ABNORMAL LOW (ref 39.0–52.0)
Hemoglobin: 12.6 g/dL — ABNORMAL LOW (ref 13.0–17.0)
MCH: 30.2 pg (ref 26.0–34.0)
MCHC: 34.7 g/dL (ref 30.0–36.0)
MCV: 87.1 fL (ref 80.0–100.0)
Platelets: 145 10*3/uL — ABNORMAL LOW (ref 150–400)
RBC: 4.17 MIL/uL — ABNORMAL LOW (ref 4.22–5.81)
RDW: 13 % (ref 11.5–15.5)
WBC: 7.3 10*3/uL (ref 4.0–10.5)
nRBC: 0 % (ref 0.0–0.2)

## 2022-03-15 LAB — BASIC METABOLIC PANEL
Anion gap: 8 (ref 5–15)
BUN: 19 mg/dL (ref 6–20)
CO2: 25 mmol/L (ref 22–32)
Calcium: 8.6 mg/dL — ABNORMAL LOW (ref 8.9–10.3)
Chloride: 101 mmol/L (ref 98–111)
Creatinine, Ser: 1.88 mg/dL — ABNORMAL HIGH (ref 0.61–1.24)
GFR, Estimated: 41 mL/min — ABNORMAL LOW (ref >60)
Glucose, Bld: 103 mg/dL — ABNORMAL HIGH (ref 70–99)
Potassium: 3.7 mmol/L (ref 3.5–5.1)
Sodium: 134 mmol/L — ABNORMAL LOW (ref 135–145)

## 2022-03-15 LAB — HEPARIN LEVEL (UNFRACTIONATED)
Heparin Unfractionated: 0.39 IU/mL (ref 0.30–0.70)
Heparin Unfractionated: 0.46 IU/mL (ref 0.30–0.70)

## 2022-03-15 NOTE — Discharge Instructions (Signed)
Information on my medicine - ELIQUIS (apixaban)  Why was Eliquis prescribed for you? Eliquis was prescribed to treat blood clots that may have been found in the veins of your legs (deep vein thrombosis) or in your lungs (pulmonary embolism) and to reduce the risk of them occurring again.  What do You need to know about Eliquis ? The starting dose is 10 mg (two 5 mg tablets) taken TWICE daily for the FIRST SEVEN (7) DAYS, the dose is reduced to ONE 5 mg tablet taken TWICE daily.  Eliquis may be taken with or without food.   Try to take the dose about the same time in the morning and in the evening. If you have difficulty swallowing the tablet whole please discuss with your pharmacist how to take the medication safely.  Take Eliquis exactly as prescribed and DO NOT stop taking Eliquis without talking to the doctor who prescribed the medication.  Stopping may increase your risk of developing a new blood clot.  Refill your prescription before you run out.  After discharge, you should have regular check-up appointments with your healthcare provider that is prescribing your Eliquis.    What do you do if you miss a dose? If a dose of ELIQUIS is not taken at the scheduled time, take it as soon as possible on the same day and twice-daily administration should be resumed. The dose should not be doubled to make up for a missed dose.  Important Safety Information A possible side effect of Eliquis is bleeding. You should call your healthcare provider right away if you experience any of the following: Bleeding from an injury or your nose that does not stop. Unusual colored urine (red or dark brown) or unusual colored stools (red or black). Unusual bruising for unknown reasons. A serious fall or if you hit your head (even if there is no bleeding).  Some medicines may interact with Eliquis and might increase your risk of bleeding or clotting while on Eliquis. To help avoid this, consult your  healthcare provider or pharmacist prior to using any new prescription or non-prescription medications, including herbals, vitamins, non-steroidal anti-inflammatory drugs (NSAIDs) and supplements.  This website has more information on Eliquis (apixaban): http://[email or web address].com/eliquis/home

## 2022-03-15 NOTE — Progress Notes (Signed)
Progress Note   Patient: [name] J Iran Jr. NWG:[id number] DOB: 1965-12-19 DOA: 03/12/2022     3 DOS: the patient was seen and examined on 03/15/2022   Brief hospital course: 57 year old male with medical history significant of chronic kidney disease (stage IIIb), hypertension and gout; who presented to the hospital secondary to left leg pain, chest pain, difficulty breathing and an episode of passing out.  Workup positive for left lower extremity DVT extending from femoral- popliteal to calf veins.  VQ scan also positive for pulmonary embolism.  At time of admission blood pressure was soft.  Assessment and Plan: * DVT (deep venous thrombosis) (HCC) -Provoked DVT and with component of pulmonary embolism causing right heart strain seen on 2D echo.   -Blood pressure has now stabilized and patient demonstrating good oxygen saturation; complaining of chills chest pressure.   -Case has been discussed with pulmonologist (Dr. Melvyn Novas); recommendations given to continue heparin drip for a total of 5 days prior to transition to oral anticoagulation therapy. -Holding on tPA currently following PCCM recommendations..    Acute hypoxic respiratory failure (HCC) -O2 sat 88% on room air, likely 2/2 to thromboembolism. -Oxygen saturation and breathing symptoms has significantly improved and currently not requiring oxygen supplementation at rest. -Patient is now complaining of difficulty breathing coronary.  CKD (chronic kidney disease) stage 3, GFR 30-59 ml/min (HCC) -CKD stage IIIb.   -Appears to be at baseline currently. -Continue to maintain adequate hydration, continue to minimize nephrotoxic agents. -Follow renal function trend.  Essential hypertension -Currently stable. -continue holding chlorthalidone and metoprolol in the setting of acute PE; with plans to start resuming antihypertensive agents on 03/15/2022 -Follow-up vital signs. -Heart healthy diet discussed with patient.  Gout -Continue  allopurinol -No acute flare appreciated.    Class II obesity -Low-calorie diet, portion control and increase physical activity discussed with patient. -Body mass index is 36.01 kg/m.  Subjective:  Afebrile, no chest pain, no nausea vomiting.  Patient reports no palpitations, stable breathing and demonstrated good saturation on room air.  Physical Exam: Vitals:   03/15/22 0500 03/15/22 0600 03/15/22 0700 03/15/22 0800  BP: (!) 123/90 (!) 128/93 (!) 144/99 (!) 162/105  Pulse: 89 90 84 (!) 109  Resp: (!) 21 19 (!) 23 20  Temp: 98 F (36.7 C)     TempSrc: Oral     SpO2: 93% 93% 96% 96%  Weight: 123.8 kg     Height:       General exam: Alert, awake, oriented x 3; no acute distress.  Patient denies chest pain, palpitations, shortness of breath, nausea or vomiting.  In no acute distress. Respiratory system: Good air movement bilaterally; no using accessory muscles.  Good saturation on room air currently. Cardiovascular system:RRR.  No rub, no gallops, no JVD. Gastrointestinal system: Abdomen is obese, nondistended, soft and nontender. No organomegaly or masses felt. Normal bowel sounds heard. Central nervous system: Alert and oriented. No focal neurological deficits. Extremities: No cyanosis or clubbing. Skin: No petechiae. Psychiatry: Judgement and insight appear normal. Mood & affect appropriate.   Data Reviewed: CBC: WBC 7.3, hemoglobin 12.6 and platelet count 578 K Basic metabolic panel: Sodium 469, potassium 3.7, chloride 101, bicarb 25, BUN 19, creatinine 1.88, calcium 8.6 and GFR 41.  2D echo:  1. Left ventricular ejection fraction, by estimation, is 60 to 65%. The  left ventricle has normal function. The left ventricle has no regional  wall motion abnormalities. Left ventricular diastolic parameters were  normal. There is the interventricular  septum is flattened in systole and diastole, consistent with right  ventricular pressure and volume overload.   2. Right  ventricular systolic function is severely reduced with  McConnell's sign consistent with right ventricular strain in the setting  of pulmonary embolus. The right ventricular size is mildly enlarged.  Tricuspid regurgitation signal is inadequate  for assessing PA pressure.   3. Right atrial size was mildly dilated.   4. The mitral valve is grossly normal. Trivial mitral valve  regurgitation.   5. The aortic valve is tricuspid. Aortic valve regurgitation is not  visualized.   6. Aortic dilatation noted. There is mild dilatation of the ascending  aorta, measuring 36 mm.   7. The inferior vena cava is normal in size with greater than 50%  respiratory variability, suggesting right atrial pressure of 3 mmHg.    Family Communication: Wife and son at bedside.  Disposition: Status is: Inpatient Remains inpatient appropriate because: Continue treatment with IV anticoagulation in the setting of acute lower extremity DVT and PE.   Planned Discharge Destination: Home    Time spent: 50 minutes  Author: Barton Dubois, MD 03/15/2022 9:07 AM  For on call review [email or web address].

## 2022-03-15 NOTE — Progress Notes (Signed)
ANTICOAGULATION CONSULT NOTE - Follow Up Consult  Pharmacy Consult for heparin Indication: pulmonary embolus  Allergies  Allergen Reactions   Sulfa Antibiotics     Unknown-was told that    Patient Measurements: Height: 6\' 1"  (185.4 cm) Weight: 123.8 kg (272 lb 14.9 oz) IBW/kg (Calculated) : 79.9 Heparin Dosing Weight: 106 kg  Vital Signs: Temp: 98 F (36.7 C) (01/05 0500) Temp Source: Oral (01/05 0500) BP: 162/105 (01/05 0800) Pulse Rate: 109 (01/05 0800)  Labs: Recent Labs    03/12/22 1335 03/12/22 1509 03/12/22 2135 03/13/22 0138 03/13/22 0504 03/13/22 1902 03/13/22 2023 03/14/22 0515 03/15/22 0307  HGB 12.6*  --   --   --  11.5*  --   --  12.8* 12.6*  HCT 37.0*  --   --   --  34.9*  --   --  37.9* 36.3*  PLT  --   --   --   --  135*  --   --  136* 145*  HEPARINUNFRC  --    < >  --    < > 0.58  --   --  0.47 0.39  CREATININE 2.50*  --   --   --  1.91*  --   --   --  1.88*  TROPONINIHS  --    < > 1,584*  --   --  482* 488*  --   --    < > = values in this interval not displayed.     Estimated Creatinine Clearance: 59.1 mL/min (A) (by C-G formula based on SCr of 1.88 mg/dL (H)).   Medications:  Scheduled:   allopurinol  300 mg Oral QHS   Chlorhexidine Gluconate Cloth  6 each Topical Q0600    Assessment: Pharmacy consulted to dose heparin in patient with submassive pulmonary embolism. Patient was not on anticoagulation prior to admission.  Heparin level slightly below desired goal this morning at 0.39. CBC appears stable, no bleeding issues noted.    Goal of Therapy:  Heparin level goal of 0.5-0.7  Monitor platelets by anticoagulation protocol: Yes   Plan:  Increase heparin 2150 units/hr Daily CBC and heparin level Monitor for bleeding  Erin Hearing PharmD., BCPS Clinical Pharmacist 03/15/2022 8:16 AM

## 2022-03-15 NOTE — Progress Notes (Signed)
ANTICOAGULATION CONSULT NOTE - Follow Up Consult  Pharmacy Consult for heparin Indication: pulmonary embolus  Allergies  Allergen Reactions   Sulfa Antibiotics     Unknown-was told that    Patient Measurements: Height: 6\' 1"  (185.4 cm) Weight: 123.8 kg (272 lb 14.9 oz) IBW/kg (Calculated) : 79.9 Heparin Dosing Weight: 106 kg  Vital Signs: Temp: 99.8 F (37.7 C) (01/05 1846) Temp Source: Oral (01/05 1846) BP: 150/79 (01/05 1846) Pulse Rate: 98 (01/05 1846)  Labs: Recent Labs    03/12/22 2135 03/13/22 0138 03/13/22 0504 03/13/22 1902 03/13/22 2023 03/14/22 0515 03/15/22 0307 03/15/22 1746  HGB  --    < > 11.5*  --   --  12.8* 12.6*  --   HCT  --   --  34.9*  --   --  37.9* 36.3*  --   PLT  --   --  135*  --   --  136* 145*  --   HEPARINUNFRC  --    < > 0.58  --   --  0.47 0.39 0.46  CREATININE  --   --  1.91*  --   --   --  1.88*  --   TROPONINIHS 1,584*  --   --  482* 488*  --   --   --    < > = values in this interval not displayed.    Estimated Creatinine Clearance: 59.1 mL/min (A) (by C-G formula based on SCr of 1.88 mg/dL (H)).   Medications:  Scheduled:   allopurinol  300 mg Oral QHS   Chlorhexidine Gluconate Cloth  6 each Topical Q0600    Assessment: Pharmacy consulted to dose heparin in patient with submassive pulmonary embolism. Patient was not on anticoagulation prior to admission.   Heparin level slightly below desired goal this morning at 0.46, but RN reports that heparin was paused 2x this afternoon. One pause was 15 mins (heparin restarted 17:45 and level drawn 17:46).  CBC appears stable, no bleeding issues noted.  Goal of Therapy:  Heparin level goal of 0.5-0.7  Monitor platelets by anticoagulation protocol: Yes   Plan:  Continue heparin at 2150 units/hr Obtain a repeat level tonight based on time pump was restarted. Daily CBC and heparin level Monitor for bleeding  Blenda Nicely, RPh Clinical Pharmacist 03/15/2022,9:14 PM

## 2022-03-16 DIAGNOSIS — I82412 Acute embolism and thrombosis of left femoral vein: Secondary | ICD-10-CM | POA: Diagnosis not present

## 2022-03-16 DIAGNOSIS — I2609 Other pulmonary embolism with acute cor pulmonale: Secondary | ICD-10-CM | POA: Diagnosis not present

## 2022-03-16 DIAGNOSIS — N1832 Chronic kidney disease, stage 3b: Secondary | ICD-10-CM | POA: Diagnosis not present

## 2022-03-16 DIAGNOSIS — J9601 Acute respiratory failure with hypoxia: Secondary | ICD-10-CM | POA: Diagnosis not present

## 2022-03-16 LAB — CBC
HCT: 38.4 % — ABNORMAL LOW (ref 39.0–52.0)
Hemoglobin: 13 g/dL (ref 13.0–17.0)
MCH: 29.7 pg (ref 26.0–34.0)
MCHC: 33.9 g/dL (ref 30.0–36.0)
MCV: 87.9 fL (ref 80.0–100.0)
Platelets: 158 10*3/uL (ref 150–400)
RBC: 4.37 MIL/uL (ref 4.22–5.81)
RDW: 12.8 % (ref 11.5–15.5)
WBC: 7.8 10*3/uL (ref 4.0–10.5)
nRBC: 0 % (ref 0.0–0.2)

## 2022-03-16 LAB — HEPARIN LEVEL (UNFRACTIONATED)
Heparin Unfractionated: 0.42 IU/mL (ref 0.30–0.70)
Heparin Unfractionated: 0.7 IU/mL (ref 0.30–0.70)

## 2022-03-16 MED ORDER — PREDNISONE 20 MG PO TABS
40.0000 mg | ORAL_TABLET | Freq: Every day | ORAL | Status: DC
  Administered 2022-03-17: 40 mg via ORAL
  Filled 2022-03-16: qty 2

## 2022-03-16 MED ORDER — LORATADINE 10 MG PO TABS
10.0000 mg | ORAL_TABLET | Freq: Every day | ORAL | Status: DC
  Administered 2022-03-16 – 2022-03-17 (×2): 10 mg via ORAL
  Filled 2022-03-16 (×2): qty 1

## 2022-03-16 MED ORDER — TRAMADOL HCL 50 MG PO TABS
50.0000 mg | ORAL_TABLET | Freq: Three times a day (TID) | ORAL | Status: DC | PRN
  Administered 2022-03-16 (×2): 50 mg via ORAL
  Filled 2022-03-16 (×2): qty 1

## 2022-03-16 MED ORDER — PANTOPRAZOLE SODIUM 40 MG PO TBEC
40.0000 mg | DELAYED_RELEASE_TABLET | Freq: Every day | ORAL | Status: DC
  Administered 2022-03-16 – 2022-03-17 (×2): 40 mg via ORAL
  Filled 2022-03-16 (×2): qty 1

## 2022-03-16 NOTE — Progress Notes (Signed)
Progress Note   Patient: [name] J [place] Jr. PPI:[id number] DOB: 09-25-1965 DOA: 03/12/2022     4 DOS: the patient was seen and examined on 03/16/2022   Brief hospital course: 57 year old male with medical history significant of chronic kidney disease (stage IIIb), hypertension and gout; who presented to the hospital secondary to left leg pain, chest pain, difficulty breathing and an episode of passing out.  Workup positive for left lower extremity DVT extending from femoral- popliteal to calf veins.  VQ scan also positive for pulmonary embolism.  At time of admission blood pressure was soft.  Assessment and Plan: * DVT (deep venous thrombosis) (HCC) -Provoked DVT and with component of pulmonary embolism causing right heart strain seen on 2D echo.   -Blood pressure has now stabilized and patient demonstrating good oxygen saturation; complaining of chills chest pressure.   -Case has been discussed with pulmonologist (Dr. Sherene Sires); recommendations given to continue heparin drip for a total of 5 days prior to transition to oral anticoagulation therapy. -Holding on tPA currently following PCCM recommendations..    Acute hypoxic respiratory failure (HCC) -O2 sat 88% on room air, likely 2/2 to thromboembolism. -Oxygen saturation and breathing symptoms has significantly improved and currently not requiring oxygen supplementation at rest. -Patient is now complaining of difficulty breathing coronary.  CKD (chronic kidney disease) stage 3, GFR 30-59 ml/min (HCC) -CKD stage IIIb.   -Appears to be at baseline currently. -Continue to maintain adequate hydration, continue to minimize nephrotoxic agents. -Follow renal function trend. -Creatinine currently 1.88 and GFR of 41.  Essential hypertension -Currently stable. -continue holding chlorthalidone and metoprolol in the setting of acute PE; with plans to start resuming antihypertensive agents on 03/15/2022 -Follow-up vital signs. -Heart healthy diet discussed  with patient.  Gout -Continue allopurinol -Concerns of acute flare affecting left ankle; patient will be treated with prednisone.  Class II obesity -Low-calorie diet, portion control and increase physical activity discussed with patient. -Body mass index is 36.01 kg/m.  Subjective:  No fever, no chest pain, no nausea, no vomiting.  Complaining of left ankle pain.  Good saturation on room air appreciated.  Physical Exam: Vitals:   03/15/22 1846 03/15/22 2311 03/16/22 0406 03/16/22 1431  BP: (!) 150/79 130/81 (!) 135/92 133/88  Pulse: 98 (!) 102 83 91  Resp: 20 20 18 18   Temp: 99.8 F (37.7 C) 98.9 F (37.2 C) 98 F (36.7 C) 98.2 F (36.8 C)  TempSrc: Oral   Oral  SpO2: 97% 93% 94% 96%  Weight:      Height:       General exam: Alert, awake, oriented x 3; complaining of left ankle pain; no nausea, no vomiting, no chest pain, reports no shortness of breath.  Patient is afebrile. Respiratory system: Good air movement bilaterally; no using accessory muscles. Cardiovascular system:RRR. No rubs or gallops; no JVD. Gastrointestinal system: Abdomen is nondistended, soft and nontender. No organomegaly or masses felt. Normal bowel sounds heard. Central nervous system: Alert and oriented. No focal neurological deficits. Extremities: No cyanosis or clubbing; left ankle swelling and tenderness with movement or weightbearing appreciated. Skin: No petechiae. Psychiatry: Judgement and insight appear normal. Mood & affect appropriate.   Data Reviewed: CBC: WBC 7.8, hemoglobin 13.0 platelet count 158 Left ankle x-ray: Demonstrating periarticular//subcutaneous swelling without any fracture or bone abnormality. Basic metabolic panel: Sodium 134, potassium 3.7, chloride 101, bicarb 25, BUN 19, creatinine 1.88, calcium 8.6 and GFR 41.  2D echo:  1. Left ventricular ejection fraction, by estimation, is 60  to 65%. The  left ventricle has normal function. The left ventricle has no regional  wall  motion abnormalities. Left ventricular diastolic parameters were  normal. There is the interventricular  septum is flattened in systole and diastole, consistent with right  ventricular pressure and volume overload.   2. Right ventricular systolic function is severely reduced with  McConnell's sign consistent with right ventricular strain in the setting  of pulmonary embolus. The right ventricular size is mildly enlarged.  Tricuspid regurgitation signal is inadequate  for assessing PA pressure.   3. Right atrial size was mildly dilated.   4. The mitral valve is grossly normal. Trivial mitral valve  regurgitation.   5. The aortic valve is tricuspid. Aortic valve regurgitation is not  visualized.   6. Aortic dilatation noted. There is mild dilatation of the ascending  aorta, measuring 36 mm.   7. The inferior vena cava is normal in size with greater than 50%  respiratory variability, suggesting right atrial pressure of 3 mmHg.    Family Communication: Wife and son at bedside.  Disposition: Status is: Inpatient Remains inpatient appropriate because: Continue treatment with IV anticoagulation in the setting of acute lower extremity DVT and PE.   Planned Discharge Destination: Home  Time spent: 50 minutes  Author: Barton Dubois, MD 03/16/2022 5:06 PM  For on call review [email or web address].

## 2022-03-16 NOTE — Progress Notes (Signed)
ANTICOAGULATION CONSULT NOTE - Follow Up Consult  Pharmacy Consult for heparin Indication:  PE/DVT  Labs: Recent Labs    03/13/22 1902 03/13/22 2023 03/14/22 0515 03/14/22 0515 03/15/22 0307 03/15/22 1746 03/16/22 0021 03/16/22 0720  HGB  --   --  12.8*   < > 12.6*  --   --  13.0  HCT  --   --  37.9*  --  36.3*  --   --  38.4*  PLT  --   --  136*  --  145*  --   --  158  HEPARINUNFRC  --   --  0.47   < > 0.39 0.46 0.42 0.70  CREATININE  --   --   --   --  1.88*  --   --   --   TROPONINIHS 482* 488*  --   --   --   --   --   --    < > = values in this interval not displayed.     Assessment: 57yo male therapeutic on heparin. Level at upper limit of range.no infusion issues or signs of bleeding per RN.  Goal of Therapy:  Heparin level 0.3-0.7 units/ml   Plan:  Continue heparin infusion at current rate. F/U heparin level and CBC daily    Hart Robinsons, PharmD Clinical Pharmacist  03/16/2022,9:20 AM

## 2022-03-16 NOTE — Progress Notes (Signed)
Pt continues on heparin infusion, rates adjusted per pharmacy/lab values.  No s/s of bleeding.  IV site patent.  Tylenol given for chronic lower back pain, along with heat, and for acute left ankle pain.  Pt reporting allergy like symptoms of nasal congestion, daily claritin ordered per protocol.  SR/ST on monitor.

## 2022-03-16 NOTE — Progress Notes (Signed)
ANTICOAGULATION CONSULT NOTE - Follow Up Consult  Pharmacy Consult for heparin Indication:  PE/DVT  Labs: Recent Labs    03/13/22 0504 03/13/22 1902 03/13/22 2023 03/14/22 0515 03/15/22 0307 03/15/22 1746 03/16/22 0021  HGB 11.5*  --   --  12.8* 12.6*  --   --   HCT 34.9*  --   --  37.9* 36.3*  --   --   PLT 135*  --   --  136* 145*  --   --   HEPARINUNFRC 0.58  --   --  0.47 0.39 0.46 0.42  CREATININE 1.91*  --   --   --  1.88*  --   --   TROPONINIHS  --  482* 488*  --   --   --   --     Assessment: 57yo male therapeutic on heparin but at low end of goal, would prefer higher with submassive PE and DVT; no infusion issues or signs of bleeding per RN.  Goal of Therapy:  Heparin level 0.3-0.7 units/ml   Plan:  Will increase heparin infusion by 10% to 2400 units/hr and check level in 6 hours.    Wynona Neat, PharmD, BCPS  03/16/2022,1:28 AM

## 2022-03-16 NOTE — Progress Notes (Signed)
Patient continues to report pain to left ankle PRN given, see MAR and placed hot pack. Patient dangled on the edge of the bed during shift. Family at bedside, noted several members packed into patients room. Patient moved from room 335 to 333.

## 2022-03-17 DIAGNOSIS — M109 Gout, unspecified: Secondary | ICD-10-CM | POA: Diagnosis not present

## 2022-03-17 DIAGNOSIS — I82412 Acute embolism and thrombosis of left femoral vein: Secondary | ICD-10-CM | POA: Diagnosis not present

## 2022-03-17 DIAGNOSIS — I2609 Other pulmonary embolism with acute cor pulmonale: Secondary | ICD-10-CM | POA: Diagnosis not present

## 2022-03-17 DIAGNOSIS — K219 Gastro-esophageal reflux disease without esophagitis: Secondary | ICD-10-CM

## 2022-03-17 DIAGNOSIS — E669 Obesity, unspecified: Secondary | ICD-10-CM

## 2022-03-17 DIAGNOSIS — N1832 Chronic kidney disease, stage 3b: Secondary | ICD-10-CM | POA: Diagnosis not present

## 2022-03-17 LAB — CBC
HCT: 39.7 % (ref 39.0–52.0)
Hemoglobin: 13.1 g/dL (ref 13.0–17.0)
MCH: 29.3 pg (ref 26.0–34.0)
MCHC: 33 g/dL (ref 30.0–36.0)
MCV: 88.8 fL (ref 80.0–100.0)
Platelets: 170 10*3/uL (ref 150–400)
RBC: 4.47 MIL/uL (ref 4.22–5.81)
RDW: 12.9 % (ref 11.5–15.5)
WBC: 7.8 10*3/uL (ref 4.0–10.5)
nRBC: 0 % (ref 0.0–0.2)

## 2022-03-17 LAB — HEPARIN LEVEL (UNFRACTIONATED): Heparin Unfractionated: 0.85 IU/mL — ABNORMAL HIGH (ref 0.30–0.70)

## 2022-03-17 MED ORDER — APIXABAN 5 MG PO TABS: ORAL_TABLET | ORAL | 3 refills | Status: DC

## 2022-03-17 MED ORDER — PANTOPRAZOLE SODIUM 40 MG PO TBEC: 40.0000 mg | DELAYED_RELEASE_TABLET | Freq: Every day | ORAL | 1 refills | Status: DC

## 2022-03-17 MED ORDER — CHLORTHALIDONE 25 MG PO TABS: 12.5000 mg | ORAL_TABLET | Freq: Every day | ORAL | Status: DC

## 2022-03-17 MED ORDER — HEPARIN (PORCINE) 25000 UT/250ML-% IV SOLN
2100.0000 [IU]/h | INTRAVENOUS | Status: DC
  Administered 2022-03-17 (×2): 2100 [IU]/h via INTRAVENOUS
  Filled 2022-03-17: qty 250

## 2022-03-17 MED ORDER — TRAMADOL HCL 50 MG PO TABS: 50.0000 mg | ORAL_TABLET | Freq: Two times a day (BID) | ORAL | 0 refills | Status: DC | PRN

## 2022-03-17 MED ORDER — PREDNISONE 20 MG PO TABS: 40.0000 mg | ORAL_TABLET | Freq: Every day | ORAL | 0 refills | Status: AC

## 2022-03-17 NOTE — Discharge Summary (Signed)
Physician Discharge Summary   Patient: [name] J Iran Jr. MRN: [id number] DOB: June 17, 1965  Admit date:     03/12/2022  Discharge date: 03/17/22  Discharge Physician: Barton Dubois   PCP: Claretta Fraise, MD   Recommendations at discharge:  Repeat CBC to follow hemoglobin trend/stability Repeat basic metabolic panel to follow electrolytes and renal function Outpatient follow-up with vascular surgery to further determine if needed any intervention for extensive blood clot in his left lower extremity. -Repeat uric acid level and further adjust allopurinol therapy to further protect from gout attacks in the future. Reassess blood pressure and adjust antihypertensive treatment as needed.  Discharge Diagnoses: Principal Problem:   DVT (deep venous thrombosis) (HCC) Active Problems:   Acute hypoxic respiratory failure (HCC)   Gout   Essential hypertension   CKD (chronic kidney disease) stage 3, GFR 30-59 ml/min (HCC)   Pulmonary embolism with acute cor pulmonale (HCC)   Class 2 obesity   Gastroesophageal reflux disease  Brief Hospital Course: 57 year old male with medical history significant of chronic kidney disease (stage IIIb), hypertension and gout; who presented to the hospital secondary to left leg pain, chest pain, difficulty breathing and an episode of passing out. Workup positive for left lower extremity DVT extending from femoral- popliteal to calf veins. VQ scan also positive for pulmonary embolism. At time of admission blood pressure was soft.   Assessment and Plan: * DVT (deep venous thrombosis) (HCC) -Provoked DVT and with component of pulmonary embolism causing right heart strain seen on 2D echo.   -Blood pressure has now stabilized and patient demonstrating good oxygen saturation; complaining of chills chest pressure.   -Case has been discussed with pulmonologist (Dr. Melvyn Novas); recommendations given to continue heparin drip for a total of 5 days prior to transition to oral  anticoagulation therapy. -Patient completed his 5 days of IV anticoagulation on 03/16/2022 and successfully transitioned to oral Eliquis on 03/17/2022 prior to discharge (plan is for 10 mg twice a day for 7 days loading dose; then 5 mg twice a day at least for the next 6 months). -Following PCCM recommendations no TPA provided..     Acute hypoxic respiratory failure (HCC) -O2 sat 88% on room air, likely 2/2 to thromboembolism. -Oxygen saturation and breathing symptoms has significantly improved and patient did not require oxygen supplementation at time of discharge and patient was not is no complaining of any difficulty breathing.   CKD (chronic kidney disease) stage 3, GFR 30-59 ml/min (HCC) -CKD stage IIIb.   -Appears to be at baseline currently. -Continue to maintain adequate hydration, continue to minimize nephrotoxic agents. -Follow renal function trend. -Creatinine currently 1.88 and GFR of 41.   Essential hypertension -Currently stable. -continue holding chlorthalidone at time of discharge -Continue metoprolol. -Reassess blood pressure and further adjust antihypertensive treatment at discharge. -Heart healthy diet discussed with patient.   Gout -Continue allopurinol -Concerns of acute flare affecting left ankle; patient will be treated with prednisone.   Class II obesity -Low-calorie diet, portion control and increase physical activity discussed with patient. -Body mass index is 36.01 kg/m.  Consultants: PCCM Procedures performed: See below for x-ray reports Disposition: Home Diet recommendation: Heart healthy and low calorie diet.  DISCHARGE MEDICATION: Allergies as of 03/17/2022       Reactions   Sulfa Antibiotics    Unknown-was told that        Medication List     TAKE these medications    acetaminophen 500 MG tablet Commonly known as: TYLENOL Take 500 mg  by mouth every 6 (six) hours as needed for mild pain or headache.   allopurinol 300 MG tablet Commonly  known as: ZYLOPRIM Take 1 tablet (300 mg total) by mouth at bedtime.   apixaban 5 MG Tabs tablet Commonly known as: ELIQUIS Take 2 tablets by mouth twice a day for 7 days; then 1 tablet by mouth twice a day for the next 6 months.   chlorthalidone 25 MG tablet Commonly known as: HYGROTON Take 0.5 tablets (12.5 mg total) by mouth daily. Home medication until follow-up with PCP. What changed: additional instructions   metoprolol succinate 100 MG 24 hr tablet Commonly known as: TOPROL-XL TAKE 1 TABLET BY MOUTH EVERY DAY WITH OR IMMEDIATELY FOLLOWING A MEAL   pantoprazole 40 MG tablet Commonly known as: PROTONIX Take 1 tablet (40 mg total) by mouth daily. Start taking on: March 18, 2022   predniSONE 20 MG tablet Commonly known as: DELTASONE Take 2 tablets (40 mg total) by mouth daily with breakfast for 5 days. Start taking on: March 18, 2022   tiZANidine 4 MG tablet Commonly known as: ZANAFLEX Take 4 mg by mouth every 8 (eight) hours as needed for muscle spasms.   traMADol 50 MG tablet Commonly known as: ULTRAM Take 1 tablet (50 mg total) by mouth every 12 (twelve) hours as needed for severe pain.   traZODone 150 MG tablet Commonly known as: DESYREL Use from 1/3 to 1 tablet nightly as needed for sleep.   VITAMIN D-3 PO Take 1 tablet by mouth daily.               Durable Medical Equipment  (From admission, onward)           Start     Ordered   03/17/22 1435  For home use only DME Walker rolling  Once       Question Answer Comment  Walker: With 5 Inch Wheels   Patient needs a walker to treat with the following condition Physical deconditioning   Patient needs a walker to treat with the following condition Gout attack      03/17/22 1434            Follow-up Information     Stacks, Broadus John, MD. Schedule an appointment as soon as possible for a visit in 10 day(s).   Specialty: Family Medicine Contact information: [address] [place]  [address] [phone]         [facility], [facility] Follow up.   Why: Rolling Dan Humphreys will be shipped to your home. Contact information: [address] [place] [address] [phone]                Discharge Exam: Filed Weights   03/13/22 0404 03/14/22 0500 03/15/22 0500  Weight: 124 kg 124 kg 123.8 kg   General exam: Alert, awake, oriented x 3; no using accessory muscles, no chest pain, no nausea, no vomiting. Respiratory system: Clear to auscultation. Respiratory effort normal.  Good saturation on room air. Cardiovascular system:RRR. No murmurs, rubs, gallops. Gastrointestinal system: Abdomen is nondistended, soft and nontender. No organomegaly or masses felt. Normal bowel sounds heard. Central nervous system: Alert and oriented. No focal neurological deficits. Extremities: No cyanosis or clubbing; complaining of left ankle pain and demonstrating some swelling; there is also larger appearance on his left lower extremity in comparison to the right lower extremity. Skin: No rashes, lesions or ulcers Psychiatry: Judgement and insight appear normal. Mood & affect appropriate.  Condition at discharge: Stable and improved.  The results of significant diagnostics from this hospitalization (including imaging, microbiology, ancillary and laboratory) are listed below for reference.   Imaging Studies: DG Ankle 2 Views Left  Result Date: 03/15/2022 CLINICAL DATA:  Left ankle pain and swelling for 2 days. No known injury. EXAM: LEFT ANKLE - 2 VIEW COMPARISON:  None Available. FINDINGS: There is diffuse systemic soft tissue swelling/edema. There is superimposed moderate to high-grade lateral malleolar soft tissue swelling. The ankle mortise is symmetric and intact. Mild degenerative spurring at the lateral talar process, distal to the fibula. Mild dorsal talonavicular and navicular-cuneiform degenerative osteophytes. No acute fracture or dislocation. IMPRESSION:  Moderate to high-grade lateral malleolar soft tissue swelling. No acute fracture. Electronically Signed   By: Neita Garnetonald  Viola M.D.   On: 03/15/2022 16:05   ECHOCARDIOGRAM COMPLETE  Result Date: 03/13/2022    ECHOCARDIOGRAM REPORT   Patient Name:   [name] J [place] Jr. Date of Exam: 03/13/2022 Medical Rec #:  [id number]         Height:       73.0 in Accession #:    [id number]        Weight:       273.4 lb Date of Birth:  09/25/1965         BSA:          2.457 m Patient Age:    56 years          BP:           146/91 mmHg Patient Gender: M                 HR:           97 bpm. [facility] Procedure: 2D Echo, Color Doppler, Cardiac Doppler and Intracardiac            Opacification Agent Indications:    Pulmonary Embolus I26.09  History:        Patient has no prior history of Echocardiogram examinations.                 Signs/Symptoms:Chest Pain and Shortness of Breath; Risk                 Factors:Non-Smoker and Hypertension.  Sonographer:    Aron BabaNorma Walker Referring Phys: [id number] Onnie BoerEJIROGHENE E EMOKPAE  Sonographer Comments: Image acquisition challenging due to patient body habitus and Image acquisition challenging due to respiratory motion. IMPRESSIONS  1. Left ventricular ejection fraction, by estimation, is 60 to 65%. The left ventricle has normal function. The left ventricle has no regional wall motion abnormalities. Left ventricular diastolic parameters were normal. There is the interventricular septum is flattened in systole and diastole, consistent with right ventricular pressure and volume overload.  2. Right ventricular systolic function is severely reduced with McConnell's sign consistent with right ventricular strain in the setting of pulmonary embolus. The right ventricular size is mildly enlarged. Tricuspid regurgitation signal is inadequate for assessing PA pressure.  3. Right atrial size was mildly dilated.  4. The mitral valve is grossly normal. Trivial mitral valve regurgitation.  5. The aortic  valve is tricuspid. Aortic valve regurgitation is not visualized.  6. Aortic dilatation noted. There is mild dilatation of the ascending aorta, measuring 36 mm.  7. The inferior vena cava is normal in size with greater than 50% respiratory variability, suggesting right atrial pressure of 3 mmHg. Comparison(s): No prior Echocardiogram. FINDINGS  Left Ventricle: Left ventricular ejection fraction, by  estimation, is 60 to 65%. The left ventricle has normal function. The left ventricle has no regional wall motion abnormalities. Definity contrast agent was given IV to delineate the left ventricular  endocardial borders. The left ventricular internal cavity size was normal in size. There is borderline left ventricular hypertrophy. The interventricular septum is flattened in systole and diastole, consistent with right ventricular pressure and volume overload. Left ventricular diastolic parameters were normal. Right Ventricle: The right ventricular size is mildly enlarged. No increase in right ventricular wall thickness. Right ventricular systolic function is severely reduced. Tricuspid regurgitation signal is inadequate for assessing PA pressure. Left Atrium: Left atrial size was normal in size. Right Atrium: Right atrial size was mildly dilated. Pericardium: There is no evidence of pericardial effusion. Mitral Valve: The mitral valve is grossly normal. Trivial mitral valve regurgitation. Tricuspid Valve: The tricuspid valve is grossly normal. Tricuspid valve regurgitation is trivial. Aortic Valve: The aortic valve is tricuspid. Aortic valve regurgitation is not visualized. Pulmonic Valve: The pulmonic valve was grossly normal. Pulmonic valve regurgitation is trivial. Aorta: The aortic root is normal in size and structure and aortic dilatation noted. There is mild dilatation of the ascending aorta, measuring 36 mm. Venous: The inferior vena cava is normal in size with greater than 50% respiratory variability, suggesting  right atrial pressure of 3 mmHg. IAS/Shunts: No atrial level shunt detected by color flow Doppler.  LEFT VENTRICLE PLAX 2D LVIDd:         4.40 cm   Diastology LVIDs:         2.50 cm   LV e' medial:    6.76 cm/s LV PW:         1.00 cm   LV E/e' medial:  8.7 LV IVS:        1.00 cm   LV e' lateral:   9.20 cm/s LVOT diam:     2.40 cm   LV E/e' lateral: 6.4 LV SV:         60 LV SV Index:   24 LVOT Area:     4.52 cm  RIGHT VENTRICLE RV S prime:     13.70 cm/s TAPSE (M-mode): 2.0 cm LEFT ATRIUM             Index        RIGHT ATRIUM           Index LA diam:        3.10 cm 1.26 cm/m   RA Area:     24.80 cm LA Vol (A2C):   39.7 ml 16.16 ml/m  RA Volume:   83.20 ml  33.86 ml/m LA Vol (A4C):   52.5 ml 21.37 ml/m LA Biplane Vol: 48.4 ml 19.70 ml/m  AORTIC VALVE LVOT Vmax:   85.90 cm/s LVOT Vmean:  54.700 cm/s LVOT VTI:    0.132 m  AORTA Ao Root diam: 3.70 cm Ao Asc diam:  3.60 cm MITRAL VALVE MV Area (PHT): 4.17 cm    SHUNTS MV Decel Time: 182 msec    Systemic VTI:  0.13 m MR Peak grad: 2.0 mmHg     Systemic Diam: 2.40 cm MR Vmax:      70.60 cm/s MV E velocity: 59.00 cm/s MV A velocity: 58.60 cm/s MV E/A ratio:  1.01 Nona Dell MD Electronically signed by Nona Dell MD Signature Date/Time: 03/13/2022/11:14:37 AM    Final    NM Pulmonary Perfusion  Result Date: 03/13/2022 CLINICAL DATA:  Chest pain, syncope left lower extremity DVT. EXAM: NUCLEAR  MEDICINE PERFUSION LUNG SCAN TECHNIQUE: Perfusion images were obtained in multiple projections after intravenous injection of radiopharmaceutical. Ventilation scans intentionally deferred if perfusion scan and chest x-ray adequate for interpretation during COVID 19 epidemic. RADIOPHARMACEUTICALS:  4.4 mCi Tc-13m MAA IV COMPARISON:  Chest radiograph March 12, 2022. FINDINGS: Large segmental wedge-shaped peripheral perfusion defects in the right lung as well as a few bilateral small peripheral perfusion defects. None of these perfusion defects demonstrate a correlate on  radiograph March 12, 2022. IMPRESSION: Scintigraphic findings compatible with pulmonary embolus. These results were called by telephone at the time of interpretation on 03/13/2022 at 8:37 am to provider Dr Gwenlyn Perking, who verbally acknowledged these results. Electronically Signed   By: Maudry Mayhew M.D.   On: 03/13/2022 08:43   CT HEAD WO CONTRAST ( )  Result Date: 03/12/2022 CLINICAL DATA:  Syncope, fall EXAM: CT HEAD WITHOUT CONTRAST TECHNIQUE: Contiguous axial images were obtained from the base of the skull through the vertex without intravenous contrast. RADIATION DOSE REDUCTION: This exam was performed according to the departmental dose-optimization program which includes automated exposure control, adjustment of the mA and/or kV according to patient size and/or use of iterative reconstruction technique. COMPARISON:  None Available. FINDINGS: Brain: No acute intracranial findings are seen. There are no signs of bleeding within the cranium. Ventricles are not dilated. There is no shift of midline structures. There is no focal edema or mass effect. Vascular: Unremarkable. Skull: No fracture is seen in calvarium. Sinuses/Orbits: Unremarkable. Other: None. IMPRESSION: No acute intracranial findings are seen in noncontrast CT brain. Electronically Signed   By: Ernie Avena M.D.   On: 03/12/2022 17:06   US Venous Img Lower Unilateral Left  Result Date: 03/12/2022 CLINICAL DATA:  Left lower extremity pain with shortness of breath, recent car travel EXAM: LEFT LOWER EXTREMITY VENOUS DOPPLER ULTRASOUND TECHNIQUE: Gray-scale sonography with graded compression, as well as color Doppler and duplex ultrasound were performed to evaluate the lower extremity deep venous systems from the level of the common femoral vein and including the common femoral, femoral, profunda femoral, popliteal and calf veins including the posterior tibial, peroneal and gastrocnemius veins when visible. Spectral Doppler was utilized to  evaluate flow at rest and with distal augmentation maneuvers in the common femoral, femoral and popliteal veins. COMPARISON:  None Available. FINDINGS: Contralateral Common Femoral Vein: Respiratory phasicity is normal and symmetric with the symptomatic side. No evidence of thrombus. Normal compressibility. Common Femoral Vein: No evidence of thrombus. Normal compressibility, respiratory phasicity and response to augmentation. Saphenofemoral Junction: No evidence of thrombus. Normal compressibility and flow on color Doppler imaging. Profunda Femoral Vein: No evidence of thrombus. Normal compressibility and flow on color Doppler imaging. Femoral Vein: Diffuse hypoechoic intraluminal thrombus. Vessel noncompressible. Thrombus appears occlusive. No phasic flow. Popliteal Vein: Similar diffuse hypoechoic intraluminal thrombus appearing occlusive. Vessel noncompressible. No phasic flow. Calf Veins: Thrombus does appear to extend into the calf tibial and peroneal veins IMPRESSION: Positive exam for left femoropopliteal DVT extending into the calf veins. Electronically Signed   By: Judie Petit.  Shick M.D.   On: 03/12/2022 14:15   DG Chest Portable 1 View  Result Date: 03/12/2022 CLINICAL DATA:  Chest pain and shortness of breath.  Hypotension. EXAM: PORTABLE CHEST 1 VIEW COMPARISON:  None Available. FINDINGS: The heart size and mediastinal contours are within normal limits. Both lungs are clear. The visualized skeletal structures are unremarkable. IMPRESSION: No active disease. Electronically Signed   By: Obie Dredge M.D.   On: 03/12/2022 13:10  Microbiology: Results for orders placed or performed during the hospital encounter of 03/12/22  Resp panel by RT-PCR (RSV, Flu A&B, Covid) Anterior Nasal Swab     Status: None   Collection Time: 03/12/22 12:43 PM   Specimen: Anterior Nasal Swab  Result Value Ref Range Status   SARS Coronavirus 2 by RT PCR NEGATIVE NEGATIVE Final    Comment: (NOTE) SARS-CoV-2 target  nucleic acids are NOT DETECTED.  The SARS-CoV-2 RNA is generally detectable in upper respiratory specimens during the acute phase of infection. The lowest concentration of SARS-CoV-2 viral copies this assay can detect is 138 copies/mL. A negative result does not preclude SARS-Cov-2 infection and should not be used as the sole basis for treatment or other patient management decisions. A negative result may occur with  improper specimen collection/handling, submission of specimen other than nasopharyngeal swab, presence of viral mutation(s) within the areas targeted by this assay, and inadequate number of viral copies(<138 copies/mL). A negative result must be combined with clinical observations, patient history, and epidemiological information. The expected result is Negative.  Fact Sheet for Patients:  BloggerCourse.comhttps://www.fda.gov/media/152166/download  Fact Sheet for Healthcare Providers:  SeriousBroker.ithttps://www.fda.gov/media/152162/download  This test is no t yet approved or cleared by the [place] FDA and  has been authorized for detection and/or diagnosis of SARS-CoV-2 by FDA under an Emergency Use Authorization (EUA). This EUA will remain  in effect (meaning this test can be used) for the duration of the COVID-19 declaration under Section 564(b)(1) of the Act, 21 U.S.C.section 360bbb-3(b)(1), unless the authorization is terminated  or revoked sooner.       Influenza A by PCR NEGATIVE NEGATIVE Final   Influenza B by PCR NEGATIVE NEGATIVE Final    Comment: (NOTE) The Xpert Xpress SARS-CoV-2/FLU/RSV plus assay is intended as an aid in the diagnosis of influenza from Nasopharyngeal swab specimens and should not be used as a sole basis for treatment. Nasal washings and aspirates are unacceptable for Xpert Xpress SARS-CoV-2/FLU/RSV testing.  Fact Sheet for Patients: BloggerCourse.comhttps://www.fda.gov/media/152166/download  Fact Sheet for Healthcare  Providers: SeriousBroker.ithttps://www.fda.gov/media/152162/download  This test is not yet approved or cleared by the [place] FDA and has been authorized for detection and/or diagnosis of SARS-CoV-2 by FDA under an Emergency Use Authorization (EUA). This EUA will remain in effect (meaning this test can be used) for the duration of the COVID-19 declaration under Section 564(b)(1) of the Act, 21 U.S.C. section 360bbb-3(b)(1), unless the authorization is terminated or revoked.     Resp Syncytial Virus by PCR NEGATIVE NEGATIVE Final    Comment: (NOTE) Fact Sheet for Patients: BloggerCourse.comhttps://www.fda.gov/media/152166/download  Fact Sheet for Healthcare Providers: SeriousBroker.ithttps://www.fda.gov/media/152162/download  This test is not yet approved or cleared by the [place] FDA and has been authorized for detection and/or diagnosis of SARS-CoV-2 by FDA under an Emergency Use Authorization (EUA). This EUA will remain in effect (meaning this test can be used) for the duration of the COVID-19 declaration under Section 564(b)(1) of the Act, 21 U.S.C. section 360bbb-3(b)(1), unless the authorization is terminated or revoked.  Performed at [facility], [address] [place], [place] [address]   MRSA Next Gen by PCR, Nasal     Status: None   Collection Time: 03/12/22  5:50 PM   Specimen: Nasal Mucosa; Nasal Swab  Result Value Ref Range Status   MRSA by PCR Next Gen NOT DETECTED NOT DETECTED Final    Comment: (NOTE) The GeneXpert MRSA Assay (FDA approved for NASAL specimens only), is one component of a comprehensive MRSA colonization  surveillance program. It is not intended to diagnose MRSA infection nor to guide or monitor treatment for MRSA infections. Test performance is not FDA approved in patients less than 58 years old. Performed at [facility], [address] [place], [place] [address]     Labs: CBC: Recent Labs  Lab 03/13/22 0504 03/14/22 0515 03/15/22 0307 03/16/22 0720 03/17/22 0525  WBC  9.2 8.1 7.3 7.8 7.8  HGB 11.5* 12.8* 12.6* 13.0 13.1  HCT 34.9* 37.9* 36.3* 38.4* 39.7  MCV 89.7 88.3 87.1 87.9 88.8  PLT 135* 136* 145* 158 170   Basic Metabolic Panel: Recent Labs  Lab 03/12/22 1306 03/12/22 1335 03/13/22 0504 03/15/22 0307  NA 136 137 137 134*  K 3.5 4.7 4.0 3.7  CL 103 103 106 101  CO2 24  --  24 25  GLUCOSE 200* 188* 120* 103*  BUN 25* 31* 21* 19  CREATININE 2.23* 2.50* 1.91* 1.88*  CALCIUM 8.4*  --  8.1* 8.6*   Liver Function Tests: No results for input(s): "AST", "ALT", "ALKPHOS", "BILITOT", "PROT", "ALBUMIN" in the last 168 hours. CBG: Recent Labs  Lab 03/12/22 1242  GLUCAP 175*    Discharge time spent: greater than 30 minutes.  Signed: Vassie Loll, MD [facility] 03/17/2022

## 2022-03-17 NOTE — TOC Transition Note (Signed)
Transition of Care [facility]) - CM/SW Discharge Note   Patient Details  Name: [name] J Iran Jr. MRN: [id number] Date of Birth: Dec 19, 1965  Transition of Care [facility]) CM/SW Contact:  Boneta Lucks, RN Phone Number: 03/17/2022, 2:39 PM   Clinical Narrative:   MD ordering a rolling walker, Referral sent to [facility] with [employer] to drop ship.   Final next level of care: Home/Self Care Barriers to Discharge: Barriers Resolved   Discharge Placement     Patient and family notified of of transfer: 03/17/22  Discharge Plan and Services Additional resources added to the After Visit Summary for                 DME Arranged: Walker rolling DME Agency: [employer] Date DME Agency Contacted: 03/17/22 Time DME Agency Contacted: [phone] Representative spoke with at DME Agency: [place] ([place]) Interventions [place]: No Food Insecurity (03/12/2022)  Housing: Low Risk  (03/12/2022)  Transportation Needs: No Transportation Needs (03/12/2022)  Utilities: Not At Risk (03/12/2022)  Depression (PHQ2-9): Low Risk  (10/16/2021)  Tobacco Use: Low Risk  (03/13/2022)

## 2022-03-17 NOTE — Progress Notes (Signed)
Patient given discharge paperwork and verbalized understanding, IV's removed and all belongings given to patient. Patient accompanied by family member in stable condition.

## 2022-03-18 ENCOUNTER — Telehealth: Payer: Self-pay

## 2022-03-18 NOTE — Telephone Encounter (Signed)
Transition Care Management Unsuccessful Follow-up Telephone Call  Date of discharge and from where:  03/17/2021  [facility]   Attempts:  1st Attempt  Reason for unsuccessful TCM follow-up call:  Left voice message

## 2022-03-19 NOTE — Telephone Encounter (Signed)
Transition Care Management Unsuccessful Follow-up Telephone Call  Date of discharge and from where:  [facility] 03/17/2021  Attempts:  2nd Attempt  Reason for unsuccessful TCM follow-up call:  Left voice message

## 2022-03-20 ENCOUNTER — Telehealth: Payer: Self-pay | Admitting: Family Medicine

## 2022-03-20 ENCOUNTER — Encounter: Payer: Self-pay | Admitting: Family Medicine

## 2022-03-20 ENCOUNTER — Ambulatory Visit (INDEPENDENT_AMBULATORY_CARE_PROVIDER_SITE_OTHER): Admitting: Family Medicine

## 2022-03-20 VITALS — BP 113/63 | HR 61 | Temp 98.2°F | Ht 73.0 in | Wt 263.5 lb

## 2022-03-20 DIAGNOSIS — R931 Abnormal findings on diagnostic imaging of heart and coronary circulation: Secondary | ICD-10-CM

## 2022-03-20 DIAGNOSIS — I82412 Acute embolism and thrombosis of left femoral vein: Secondary | ICD-10-CM | POA: Diagnosis not present

## 2022-03-20 DIAGNOSIS — J9601 Acute respiratory failure with hypoxia: Secondary | ICD-10-CM | POA: Diagnosis not present

## 2022-03-20 DIAGNOSIS — N1832 Chronic kidney disease, stage 3b: Secondary | ICD-10-CM | POA: Diagnosis not present

## 2022-03-20 DIAGNOSIS — I2609 Other pulmonary embolism with acute cor pulmonale: Secondary | ICD-10-CM | POA: Diagnosis not present

## 2022-03-20 DIAGNOSIS — I10 Essential (primary) hypertension: Secondary | ICD-10-CM

## 2022-03-20 DIAGNOSIS — M109 Gout, unspecified: Secondary | ICD-10-CM

## 2022-03-20 NOTE — Telephone Encounter (Signed)
Patient would like to know what his limitations are as far as daily activities. Lifting, climbing stairs, etc?

## 2022-03-20 NOTE — Progress Notes (Unsigned)
Established Patient Office Visit  Subjective   Patient ID: [name] J Iran Jr., male    DOB: 11/26/1965  Age: 57 y.o. MRN: [id number]  Chief Complaint  Patient presents with   Transitions Of Care    HPI  Today's visit was for Transitional Care Management.  The patient was discharged from [facility] on 03/17/22 with a primary diagnosis of DVT.   Contact with the patient and/or caregiver, by a clinical staff member, was made on 03/18/22 and was documented as a telephone encounter within the EMR.  Through chart review and discussion with the patient I have determined that management of their condition is of moderate complexity.    [name] [name] presented in the ER at [facility] with chest pain, left leg pain, dyspnea, and syncope. Left lower extremeity DVE from femora to popliteal to calf veins was found on workup. A pulmonary embolism was also found with right hear strain noted on echo. He was admitted for IV anticoagulation and was transitioned to oral eliquis after 5 days of IV heparin. He was discharged on room air. CKD was noted to be at baseline. BP was stablized. He has only been taking a 1/2 tablet of chlorthalidone and was instructed to continue at a 1/2 tablet. There was also concerns for left ankle gout flare. He was treated with prednisone. He is on allopurinol for chronic gout.   He continues to have some pain in his left leg, this worsens with standing. Swelling has improved in her leg and ankle. Denies shortness of breath or chest pain.     Past Medical History:  Diagnosis Date   Gout    Hypertension    Medical history non-contributory    Wears glasses       ROS As per HPI.    Objective:     BP 113/63   Pulse 61   Temp 98.2 F (36.8 C) (Temporal)   Ht 6\' 1"  (1.854 m)   Wt 263 lb 8 oz (119.5 kg)   SpO2 97%   BMI 34.76 kg/m    Physical Exam Vitals and nursing note reviewed.  Constitutional:      General: He is not in acute distress.    Appearance: He is not ill-appearing,  toxic-appearing or diaphoretic.  HENT:     Head: Normocephalic and atraumatic.  Cardiovascular:     Rate and Rhythm: Normal rate and regular rhythm.     Heart sounds: Normal heart sounds. No murmur heard. Pulmonary:     Effort: Pulmonary effort is normal. No respiratory distress.     Breath sounds: Normal breath sounds. No wheezing, rhonchi or rales.  Abdominal:     General: Bowel sounds are normal. There is no distension.     Palpations: Abdomen is soft.     Tenderness: There is no abdominal tenderness. There is no guarding.  Musculoskeletal:     Comments: Mild swelling to left lower extremity with tenderness. No warmth or erythema. Full ROM.   Skin:    General: Skin is warm and dry.  Neurological:     General: No focal deficit present.     Mental Status: He is alert and oriented to person, place, and time.     Motor: No weakness.     Gait: Gait abnormal (using cane).  Psychiatric:        Mood and Affect: Mood normal.        Behavior: Behavior normal.        Thought Content: Thought content normal.  Judgment: Judgment normal.      No results found for any visits on 03/20/22.    The 10-year ASCVD risk score (Arnett DK, et al., 2019) is: 10.1%    Assessment & Plan:   [name] [name] was seen today for transitions of care.  Diagnoses and all orders for this visit:  Acute deep vein thrombosis (DVT) of femoral vein of left lower extremity (HCC) Treated with IV heparin x 5 days, then transitioned to eliquis. Improving symptoms. Continue eliquis as prescribed. Elevate, avoid sitting or standing for long periods. Ease back into activity. Referral to vascular placed.  -     Ambulatory referral to Vascular Surgery -     CBC with Differential/Platelet -     BMP8+EGFR  Acute pulmonary embolism with acute cor pulmonale, unspecified pulmonary embolism type (HCC) Denies shortness of breath or chest pain. Lungs clear on exam. Continue eliquis.  -     Ambulatory referral to Vascular  Surgery -     CBC with Differential/Platelet -     BMP8+EGFR  Acute hypoxic respiratory failure (HCC) Resolved.  -     CBC with Differential/Platelet -     BMP8+EGFR  Stage 3b chronic kidney disease ([place]) Labs pending.  -     CBC with Differential/Platelet -     BMP8+EGFR  Primary hypertension BP well controlled today. Continue current regimen.  -     CBC with Differential/Platelet -     BMP8+EGFR  Gout of foot, unspecified cause, unspecified chronicity, unspecified laterality On allopurinol. Treated with prednisone in hospital. Labs pending.  -     Uric acid  Abnormal echocardiogram Some evidence of LVH on echo. Right strain noted consistent with acute PE. Referral to cardiology for further evaluation.  -     Ambulatory referral to Cardiology   Return if symptoms worsen or fail to improve. Keep follow up appt with PCP.   The patient indicates understanding of these issues and agrees with the plan.     Gwenlyn Perking, FNP

## 2022-03-21 LAB — CBC WITH DIFFERENTIAL/PLATELET
Basophils Absolute: 0 10*3/uL (ref 0.0–0.2)
Basos: 0 %
EOS (ABSOLUTE): 0 10*3/uL (ref 0.0–0.4)
Eos: 0 %
Hematocrit: 41.2 % (ref 37.5–51.0)
Hemoglobin: 13.8 g/dL (ref 13.0–17.7)
Immature Grans (Abs): 0.1 10*3/uL (ref 0.0–0.1)
Immature Granulocytes: 1 %
Lymphocytes Absolute: 1 10*3/uL (ref 0.7–3.1)
Lymphs: 9 %
MCH: 28.6 pg (ref 26.6–33.0)
MCHC: 33.5 g/dL (ref 31.5–35.7)
MCV: 86 fL (ref 79–97)
Monocytes Absolute: 0.3 10*3/uL (ref 0.1–0.9)
Monocytes: 3 %
Neutrophils Absolute: 10 10*3/uL — ABNORMAL HIGH (ref 1.4–7.0)
Neutrophils: 87 %
Platelets: 270 10*3/uL (ref 150–450)
RBC: 4.82 x10E6/uL (ref 4.14–5.80)
RDW: 12.5 % (ref 11.6–15.4)
WBC: 11.5 10*3/uL — ABNORMAL HIGH (ref 3.4–10.8)

## 2022-03-21 LAB — BMP8+EGFR
BUN/Creatinine Ratio: 19 (ref 9–20)
BUN: 41 mg/dL — ABNORMAL HIGH (ref 6–24)
CO2: 22 mmol/L (ref 20–29)
Calcium: 9.8 mg/dL (ref 8.7–10.2)
Chloride: 98 mmol/L (ref 96–106)
Creatinine, Ser: 2.11 mg/dL — ABNORMAL HIGH (ref 0.76–1.27)
Glucose: 167 mg/dL — ABNORMAL HIGH (ref 70–99)
Potassium: 4.9 mmol/L (ref 3.5–5.2)
Sodium: 135 mmol/L (ref 134–144)
eGFR: 36 mL/min/{1.73_m2} — ABNORMAL LOW (ref >59)

## 2022-03-21 LAB — URIC ACID: Uric Acid: 5.5 mg/dL (ref 3.8–8.4)

## 2022-03-21 NOTE — Telephone Encounter (Signed)
He should start with light activity and slowly increase this each week. Elevated leg when able. Don't sit or stand for long periods.

## 2022-03-22 NOTE — Telephone Encounter (Signed)
Transition Care Management Unsuccessful Follow-up Telephone Call  Date of discharge and from where:  [facility] 03/17/2022  Attempts:  3rd Attempt  Reason for unsuccessful TCM follow-up call:  No answer/busy

## 2022-03-25 ENCOUNTER — Ambulatory Visit: Admitting: Surgery

## 2022-03-25 ENCOUNTER — Encounter: Payer: Self-pay | Admitting: Surgery

## 2022-03-25 VITALS — BP 123/75 | HR 63 | Temp 98.0°F | Resp 20 | Ht 73.0 in | Wt 261.8 lb

## 2022-03-25 DIAGNOSIS — I82402 Acute embolism and thrombosis of unspecified deep veins of left lower extremity: Secondary | ICD-10-CM

## 2022-03-25 NOTE — Progress Notes (Addendum)
[facility] [place]  Patient name: [name] J Guinea-Bissau Jr. MRN: [id number] DOB: 08-10-1965 Sex: male   REQUESTING PROVIDER:    Dr. Lequita Halt   REASON FOR CONSULT:    DVT/PE  HISTORY OF PRESENT ILLNESS:   [name] J Guinea-Bissau Jr. is a 57 y.o. male, who presented to the emergency department at [facility] on 03/20/2018 for with left leg pain.  He was also found to have a PE on VQ scan with right heart strain noted on echo.  He was admitted for IV anticoagulation and transition to Eliquis after 5 days.  His symptoms started on New Year's day.  He mainly had left leg pain and then later that day had trouble breathing with chest tightness.  The following day he passed out and likely hit his head.  His fall was witnessed by his wife.  He was hypotensive upon arrival with blood pressure of 70/50.  He was deemed not to be a candidate for intervention by radiology.  There were no inciting factors for his DVT.  He denies any prior trauma.  He denies any acute illness.  He denies any prolonged inactivity.  Patient has CKD 3B.  He is medically managed for hypertension.  He was treated in the hospital for a gout flare with steroids.  PAST MEDICAL HISTORY    Past Medical History:  Diagnosis Date   DVT (deep venous thrombosis) (HCC)    Gout    Hypertension    Medical history non-contributory    Pulmonary embolism (HCC)    Wears glasses      FAMILY HISTORY   Family History  Problem Relation Age of Onset   Colon cancer Mother        Precancerous colon polyps; unsure of actual CRC?   Diabetes Father    Hypertension Father    Heart attack Father     SOCIAL HISTORY:   Social History   Socioeconomic History   Marital status: Married    Spouse name: Not on file   Number of children: Not on file   Years of education: Not on file   Highest education level: Not on file  Occupational History   Not on file  Tobacco Use   Smoking status: Never    Smokeless tobacco: Never  Substance and Sexual Activity   Alcohol use: Yes    Comment: rare   Drug use: No   Sexual activity: Not on file  Other Topics Concern   Not on file  Social History Narrative   Not on file   Social Determinants of Health   Financial Resource Strain: Not on file  Food Insecurity: No Food Insecurity (03/12/2022)   Hunger Vital Sign    Worried About Running Out of Food in the Last Year: Never true    Ran Out of Food in the Last Year: Never true  Transportation Needs: No Transportation Needs (03/12/2022)   PRAPARE - Administrator, Civil Service (Medical): No    Lack of Transportation (Non-Medical): No  Physical Activity: Not on file  Stress: Not on file  Social Connections: Not on file  Intimate Partner Violence: Not At Risk (03/12/2022)   Humiliation, Afraid, Rape, and Kick questionnaire    Fear of Current or Ex-Partner: No    Emotionally Abused: No    Physically Abused: No    Sexually Abused: No    ALLERGIES:    Allergies  Allergen Reactions   Sulfa Antibiotics  Unknown-was told that    CURRENT MEDICATIONS:    Current Outpatient Medications  Medication Sig Dispense Refill   acetaminophen (TYLENOL) 500 MG tablet Take 500 mg by mouth every 6 (six) hours as needed for mild pain or headache.     allopurinol (ZYLOPRIM) 300 MG tablet Take 1 tablet (300 mg total) by mouth at bedtime. 90 tablet 3   apixaban (ELIQUIS) 5 MG TABS tablet Take 2 tablets by mouth twice a day for 7 days; then 1 tablet by mouth twice a day for the next 6 months. 60 tablet 3   chlorthalidone (HYGROTON) 25 MG tablet Take 0.5 tablets (12.5 mg total) by mouth daily. Home medication until follow-up with PCP.     Cholecalciferol (VITAMIN D-3 PO) Take 1 tablet by mouth daily.     metoprolol succinate (TOPROL-XL) 100 MG 24 hr tablet TAKE 1 TABLET BY MOUTH EVERY DAY WITH OR IMMEDIATELY FOLLOWING A MEAL 90 tablet 1   pantoprazole (PROTONIX) 40 MG tablet Take 1 tablet (40  mg total) by mouth daily. 30 tablet 1   tiZANidine (ZANAFLEX) 4 MG tablet Take 4 mg by mouth every 8 (eight) hours as needed for muscle spasms.     traMADol (ULTRAM) 50 MG tablet Take 1 tablet (50 mg total) by mouth every 12 (twelve) hours as needed for severe pain. 20 tablet 0   traZODone (DESYREL) 150 MG tablet Use from 1/3 to 1 tablet nightly as needed for sleep. 30 tablet 5   No current facility-administered medications for this visit.    REVIEW OF SYSTEMS:   [X]  denotes positive finding, [ ]  denotes negative finding Cardiac  Comments:  Chest pain or chest pressure:    Shortness of breath upon exertion:    Short of breath when lying flat:    Irregular heart rhythm:        Vascular    Pain in calf, thigh, or hip brought on by ambulation:    Pain in feet at night that wakes you up from your sleep:     Blood clot in your veins:    Leg swelling:         Pulmonary    Oxygen at home:    Productive cough:     Wheezing:         Neurologic    Sudden weakness in arms or legs:     Sudden numbness in arms or legs:     Sudden onset of difficulty speaking or slurred speech:    Temporary loss of vision in one eye:     Problems with dizziness:         Gastrointestinal    Blood in stool:      Vomited blood:         Genitourinary    Burning when urinating:     Blood in urine:        Psychiatric    Major depression:         Hematologic    Bleeding problems:    Problems with blood clotting too easily:        Skin    Rashes or ulcers:        Constitutional    Fever or chills:     PHYSICAL EXAM:   Vitals:   03/25/22 0839  BP: 123/75  Pulse: 63  Resp: 20  Temp: 98 F (36.7 C)  SpO2: 95%  Weight: 261 lb 12.8 oz (118.8 kg)  Height: 6\' 1"  (1.854 m)  GENERAL: The patient is a well-nourished male, in no acute distress. The vital signs are documented above. CARDIAC: There is a regular rate and rhythm.  VASCULAR: No significant leg swelling.  Palpable pedal  pulses PULMONARY: Nonlabored respirations MUSCULOSKELETAL: There are no major deformities or cyanosis. NEUROLOGIC: No focal weakness or paresthesias are detected. SKIN: There are no ulcers or rashes noted. PSYCHIATRIC: The patient has a normal affect.  STUDIES:   I have reviewed the following: VQ scan: Scintigraphic findings compatible with pulmonary embolus.   Venous Dopplers: Positive exam for left femoropopliteal DVT extending into the calf veins.  ASSESSMENT and PLAN   Unprovoked left leg DVT with subsequent PE: The patient is on Eliquis currently.  We discussed wearing compression socks.  I also spoke about referral to a hematologist in 3 months, which I have already arranged.  I am going to have him follow-up in the DVT clinic in 2 weeks to make sure he is taking his medication properly and that he is on the appropriate medication given his renal disease and based off of his insurance profile.  He also has a follow-up with cardiology to address his right heart strain.   Leia Alf, MD, FACS [facility] [phone] Pager [phone]

## 2022-04-08 ENCOUNTER — Ambulatory Visit (HOSPITAL_COMMUNITY)
Admission: RE | Admit: 2022-04-08 | Discharge: 2022-04-08 | Disposition: A | Discharge status: Home / Self Care | Source: Ambulatory Visit | Attending: Student-PharmD | Admitting: Student-PharmD

## 2022-04-08 ENCOUNTER — Encounter (HOSPITAL_COMMUNITY): Payer: Self-pay

## 2022-04-08 VITALS — BP 121/77 | HR 65

## 2022-04-08 DIAGNOSIS — I82412 Acute embolism and thrombosis of left femoral vein: Secondary | ICD-10-CM | POA: Insufficient documentation

## 2022-04-08 DIAGNOSIS — I2609 Other pulmonary embolism with acute cor pulmonale: Secondary | ICD-10-CM

## 2022-04-08 NOTE — Progress Notes (Signed)
DVT Clinic Note  Name: [name] J Guinea-Bissau Jr.     MRN: [id number]     DOB: 18-Sep-1965     Sex: male  PCP: Mechele Claude, MD  Today's Visit: Visit Information: Follow Up Visit  Referred to CPP by: Dr. Myra Gianotti Reason for referral:  Chief Complaint  Patient presents with   Med Management - DVT   HISTORY OF PRESENT ILLNESS:  [name] J Guinea-Bissau Jr. is a 57 y.o. male who presents for follow up medication management after diagnosis of DVT on 03/12/22. Patient presented to the ED at [facility] on 03/12/22 with left leg pain and found to have left femoropopliteal DVT extending into the calf veins. He was also found to have a PE on VQ scan with right heart strain noted on echo. His symptoms started on New Year's Day. He mainly had left leg pain and then later that day had trouble breathing with chest tightness. The following day he passed out and likely hit his head. His fall was witnessed by his wife. He was hypotensive upon arrival with blood pressure of 70/50. He was deemed not to be a candidate for intervention by radiology. He was admitted 03/12/22-03/17/22 for IV heparin then transitioned to Eliquis. He was seen by Dr. Myra Gianotti 03/25/22 for follow up. No apparent inciting factors for VTE. Dr. Myra Gianotti referred him to hematology for further work up of this and to myself for medication management given his CKD and insurance.   Today patient reports L left pain continues to improve. He went back to church for the first time yesterday since his discharge and reports he had some swelling later in the day. Has not yet purchased compression stockings. Elevates his leg while sitting in a chair. Denies abnormal bleeding or bruising since starting Eliquis. Denies missed doses of Eliquis. Reports that one of his providers sent him refills for Eliquis to receive through a mail order pharmacy which is preferred by his insurance. He received notification this was sent in on 04/01/22, but he has not yet received it in the mail and is about  to run out of medication on hand.   Positive Thrombotic Risk Factors: None Present Bleeding Risk Factors: Anticoagulant therapy  Negative Thrombotic Risk Factors: Previous VTE, Recent surgery (within 3 months), Recent trauma (within 3 months), Recent admission to hospital with acute illness (within 3 months), Paralysis, paresis, or recent plaster cast immobilization of lower extremity, Central venous catheterization, Sedentary journey lasting >8 hours within 4 weeks, Pregnancy, Testosterone therapy, Estrogen therapy, Recent cesarean section (within 3 months), Bed rest >72 hours within 3 months, Within 6 weeks postpartum, Erythropoiesis-stimulating agent, Recent COVID diagnosis (within 3 months), Active cancer, Smoking, Obesity, Older age, Known thrombophilic condition, Non-malignant, chronic inflammatory condition  Rx Insurance Coverage: [employer] Rx Affordability: Eliquis is $43 for a 30 day supply at a local pharmacy but goes up after 2 fills. Cost is $38 for a 90 day supply through [facility]. Refills have been sent here and patient is awaiting delivery.  Preferred Pharmacy: [facility] for medications needed the same day. [facility] for long-term medications as 90 day supplies due to much lower costs through his [employer].   Past Medical History:  Diagnosis Date   DVT (deep venous thrombosis) (HCC)    Gout    Hypertension    Medical history non-contributory    Pulmonary embolism (HCC)    Wears glasses     Past Surgical History:  Procedure Laterality Date   COLONOSCOPY WITH PROPOFOL N/A  08/02/2019   Procedure: COLONOSCOPY WITH PROPOFOL;  Surgeon: Daneil Dolin, MD;  Location: [facility];  Service: Endoscopy;  Laterality: N/A;  9:15am   KNEE ARTHROSCOPY WITH PATELLAR TENDON REPAIR Left 09/03/2012   Procedure: LEFT KNEE ARTHROSCOPY, LOOSE BODY EXCISION, PATELLAR TENDON REPAIR, EXCISION TIBIAL TUBERCLE SPUR, CHONDROPLASTY PATELLA, EXCISION PLICA;  Surgeon: Ninetta Lights, MD;   Location: [place];  Service: Orthopedics;  Laterality: Left;   WISDOM TOOTH EXTRACTION      Social History   Socioeconomic History   Marital status: Married    Spouse name: Not on file   Number of children: Not on file   Years of education: Not on file   Highest education level: Not on file  Occupational History   Not on file  Tobacco Use   Smoking status: Never   Smokeless tobacco: Never  Substance and Sexual Activity   Alcohol use: Yes    Comment: rare   Drug use: No   Sexual activity: Not on file  Other Topics Concern   Not on file  Social History Narrative   Not on file   Social Determinants of Health   Financial Resource Strain: Not on file  Food Insecurity: No Food Insecurity (03/12/2022)   Hunger Vital Sign    Worried About Running Out of Food in the Last Year: Never true    Ran Out of Food in the Last Year: Never true  Transportation Needs: No Transportation Needs (03/12/2022)   PRAPARE - Hydrologist (Medical): No    Lack of Transportation (Non-Medical): No  Physical Activity: Not on file  Stress: Not on file  Social Connections: Not on file  Intimate Partner Violence: Not At Risk (03/12/2022)   Humiliation, Afraid, Rape, and Kick questionnaire    Fear of Current or Ex-Partner: No    Emotionally Abused: No    Physically Abused: No    Sexually Abused: No    Family History  Problem Relation Age of Onset   Colon cancer Mother        Precancerous colon polyps; unsure of actual CRC?   Diabetes Father    Hypertension Father    Heart attack Father     Allergies as of 04/08/2022 - Review Complete 04/08/2022  Allergen Reaction Noted   Sulfa antibiotics  08/06/2012    Current Outpatient Medications on File Prior to Encounter  Medication Sig Dispense Refill   acetaminophen (TYLENOL) 500 MG tablet Take 500 mg by mouth every 6 (six) hours as needed for mild pain or headache.     allopurinol (ZYLOPRIM) 300 MG tablet Take  1 tablet (300 mg total) by mouth at bedtime. 90 tablet 3   apixaban (ELIQUIS) 5 MG TABS tablet Take 5 mg by mouth 2 (two) times daily.     chlorthalidone (HYGROTON) 25 MG tablet Take 0.5 tablets (12.5 mg total) by mouth daily. Home medication until follow-up with PCP.     Cholecalciferol (VITAMIN D-3 PO) Take 1 tablet by mouth daily.     lidocaine (LIDODERM) 5 % Place 1 patch onto the skin daily.     metoprolol succinate (TOPROL-XL) 100 MG 24 hr tablet TAKE 1 TABLET BY MOUTH EVERY DAY WITH OR IMMEDIATELY FOLLOWING A MEAL 90 tablet 1   pantoprazole (PROTONIX) 40 MG tablet Take 1 tablet (40 mg total) by mouth daily. 30 tablet 1   tiZANidine (ZANAFLEX) 4 MG tablet Take 4 mg by mouth every 8 (eight) hours as needed  for muscle spasms.     traMADol (ULTRAM) 50 MG tablet Take 1 tablet (50 mg total) by mouth every 12 (twelve) hours as needed for severe pain. 20 tablet 0   traZODone (DESYREL) 150 MG tablet Use from 1/3 to 1 tablet nightly as needed for sleep. 30 tablet 5   No current facility-administered medications on file prior to encounter.   REVIEW OF SYSTEMS:  Review of Systems  Respiratory:  Negative for shortness of breath.   Cardiovascular:  Positive for leg swelling. Negative for chest pain and palpitations.  Musculoskeletal:  Negative for myalgias.  Neurological:  Positive for dizziness (when standing up too quickly).   PHYSICAL EXAMINATION:  Vitals:   04/08/22 0915  BP: 121/77  Pulse: 65  SpO2: 98%   Physical Exam Vitals reviewed.  Cardiovascular:     Rate and Rhythm: Normal rate.  Pulmonary:     Effort: Pulmonary effort is normal.  Musculoskeletal:        General: Swelling (mild non-pitting edema bilaterally) present.  Psychiatric:        Mood and Affect: Mood normal.        Behavior: Behavior normal.        Thought Content: Thought content normal.   Villalta Score for Post-Thrombotic Syndrome: Pain: Mild Cramps: Absent Heaviness: Absent Paresthesia: Mild Pruritus:  Mild Pretibial Edema: Mild Skin Induration: Absent Hyperpigmentation: Absent Redness: Absent Venous Ectasia: Absent Pain on calf compression: Absent Villalta Preliminary Score: 4 Is venous ulcer present?: No If venous ulcer is present and score is <15, then 15 points total are assigned: Absent Villalta Total Score: 4  LABS:  CBC     Component Value Date/Time   WBC 11.5 (H) 03/20/2022 1433   WBC 7.8 03/17/2022 0525   RBC 4.82 03/20/2022 1433   RBC 4.47 03/17/2022 0525   HGB 13.8 03/20/2022 1433   HCT 41.2 03/20/2022 1433   PLT 270 03/20/2022 1433   MCV 86 03/20/2022 1433   MCH 28.6 03/20/2022 1433   MCH 29.3 03/17/2022 0525   MCHC 33.5 03/20/2022 1433   MCHC 33.0 03/17/2022 0525   RDW 12.5 03/20/2022 1433   LYMPHSABS 1.0 03/20/2022 1433   EOSABS 0.0 03/20/2022 1433   BASOSABS 0.0 03/20/2022 1433    Hepatic Function      Component Value Date/Time   PROT 7.1 10/16/2021 1015   ALBUMIN 4.5 10/16/2021 1015   AST 23 10/16/2021 1015   ALT 23 10/16/2021 1015   ALKPHOS 78 10/16/2021 1015   BILITOT 0.4 10/16/2021 1015    Renal Function   Lab Results  Component Value Date   CREATININE 2.11 (H) 03/20/2022   CREATININE 1.88 (H) 03/15/2022   CREATININE 1.91 (H) 03/13/2022    CrCl cannot be calculated (Unknown ideal weight.).   Imaging:  VQ scan: 03/13/22 Scintigraphic findings compatible with pulmonary embolus.    Venous Dopplers: 03/12/22 Positive exam for left femoropopliteal DVT extending into the calf veins.  ASSESSMENT: Location of DVT: Left femoral vein, Left popliteal vein, Left distal vein Cause of DVT: unprovoked - has been referred to hematology by Dr. Myra Gianotti   Patient is adherent with Eliquis with no adverse effects. No dose adjustment needed for renal function. His insurance is particular about what pharmacy he needs to fill at for it to be most affordable which is now resolved. However, he is still waiting for it to arrive in the mail and he is about to run  out of Eliquis, so I will provide him with a  week's worth of samples to prevent any missed doses.    PLAN: -Continue apixaban (Eliquis) 5 mg twice daily. -Expected duration of therapy: Likely long-term given unprovoked DVT/PE - will defer to hematology. Therapy started on 03/12/22. -Patient educated on purpose, proper use and potential adverse effects of apixaban (Eliquis). -Discussed importance of taking medication around the same time every day. -Advised patient of medications to avoid (NSAIDs, aspirin doses >100 mg daily). -Educated that Tylenol (acetaminophen) is the preferred analgesic to lower the risk of bleeding. -Advised patient to alert all providers of anticoagulation therapy prior to starting a new medication or having a procedure. -Emphasized importance of monitoring for signs and symptoms of bleeding (abnormal bruising, prolonged bleeding, nose bleeds, bleeding from gums, discolored urine, black tarry stools). -Educated patient to present to the ED if emergent signs and symptoms of new thrombosis occur. -Counseled patient to wear compression stockings daily, removing at night. He plans to purchase these soon. Encouraged patient to elevate legs above heart.  -Provided patient with one week of Eliquis samples to last until his refill comes from his [facility] in the mail.   Medication Samples have been provided to the patient. Drug name: Eliquis (apixaban)        Strength: 5 mg         Qty: 14   LOT: [id number]  Exp.Date: 10/09/23 Dosing instructions: Take 1 tablet (5 mg) twice daily.  The patient has been instructed regarding the correct time, dose, and frequency of taking this medication, including desired effects and most common side effects.   Follow up: Patient has upcoming follow up with hematology, primary care, and cardiology to address R heart strain in the next few weeks. Will follow up with DVT Clinic as needed.   Rebbeca Paul, PharmD, [place], CPP Deep Vein Thrombosis  Clinic Clinical Pharmacist Practitioner Office: [phone]

## 2022-04-08 NOTE — Patient Instructions (Signed)
-  Continue Eliquis 5 mg (1 tablet) twice daily.  -It is important to take your medication around the same time every day.  -Avoid NSAIDs like ibuprofen (Advil, Motrin) and naproxen (Aleve) as well as aspirin doses over 100 mg daily. -Tylenol (acetaminophen) is the preferred over the counter pain medication to lower the risk of bleeding. -Be sure to alert all of your health care providers that you are taking an anticoagulant prior to starting a new medication or having a procedure. -Monitor for signs and symptoms of bleeding (abnormal bruising, prolonged bleeding, nose bleeds, bleeding from gums, discolored urine, black tarry stools). If you have fallen and hit your head OR if your bleeding is severe or not stopping, seek emergency care.  -Go to the emergency room if emergent signs and symptoms of new clot occur (new or worse swelling and pain in an arm or leg, shortness of breath, chest pain, fast or irregular heartbeats, lightheadedness, dizziness, fainting, coughing up blood) or if you experience a significant color change (pale or blue) in the extremity that has the DVT.  -We recommend you wear thigh high compression stockings (20-30 mmHg) as long as you are having swelling or pain. Be sure to purchase the correct size and take them off at night. Measure the smallest part of the ankle, largest part of the calf, and largest part of the thigh. Purchase the largest size your measurements put you in.   If you have any questions, please call [phone].  If you are having an emergency, call 911 or present to the nearest emergency room.   What is a DVT?  -Deep vein thrombosis (DVT) is a condition in which a blood clot forms in a vein of the deep venous system which can occur in the lower leg, thigh, pelvis, arm, or neck. This condition is serious and can be life-threatening if the clot travels to the arteries of the lungs and causing a blockage (pulmonary embolism, PE). A DVT can also damage veins in the  leg, which can lead to long-term venous disease, leg pain, swelling, discoloration, and ulcers or sores (post-thrombotic syndrome).  -Treatment may include taking an anticoagulant medication to prevent more clots from forming and the current clot from growing, wearing compression stockings, and/or surgical procedures to remove or dissolve the clot.

## 2022-04-15 NOTE — Progress Notes (Unsigned)
[place], [place] [address]   CLINIC:  Medical Oncology/Hematology  CONSULT NOTE  Patient Care Team: Claretta Fraise, MD as PCP - General (Family Medicine)  CHIEF COMPLAINTS/PURPOSE OF CONSULTATION:  Unprovoked DVT/PE   HISTORY OF PRESENTING ILLNESS:   [name] J [name] Jr. 57 y.o. male is here at the request of Dr. Harold Barban for hypercoagulable workup for unprovoked DVT/PE.  [name] [name] was hospitalized at [facility] from 03/12/2022 through 03/17/2022 for acute unprovoked DVT and PE.  Initial symptoms were left leg pain followed by difficulty breathing and chest tightness and subsequent syncopal episode the following day.  On arrival to the ED, patient was hypotensive with blood pressure 70/50.  [name] [name] was found to have left leg DVT as well as PE on VQ scan with right heart strain noted on echo.  [name] [name] was admitted for IV anticoagulation and transition to Eliquis after 5 days.  [name] [name] was deemed not to be a candidate for intervention by radiology.  [name] [name] continues to have some mild lower extremity swelling, but this is significantly improved.  [name] [name] reports some soreness in his left calf and left upper thigh, as well as some soreness in his right calf.  [name] [name] has some sharp intermittent nonpleuritic chest pain that has been happening on and off since hospitalization.  [name] [name] reports some palpitations, but denies any shortness of breath, dyspnea, cough, or hemoptysis.  [name] [name] reports that [name] [name] is compliant with Eliquis 5 mg twice daily and denies any major bleeding events.   There were no inciting factors for his DVT.  [name] [name] denies any prior trauma.  [name] [name] denies any acute illness, hospitalization, or surgery.  [name] [name] denies any prolonged inactivity.  [name] [name] is a non-smoker and is up-to-date on his age-appropriate cancer screenings (colonoscopy in 2021).  No prior DVT or PE.  Potential risk factors include obesity, hypertension, and CKD.  [name] [name] does not have any history of diabetes, heart failure, or  inflammatory bowel disease.  [name] [name] denies any known family history of coagulopathy, but reports that his father had blood clots in his 33s. One of his daughters miscarried x 2, another daughter had miscarriage x 1.  Patient's mother had miscarriage x 1.  Past medical history includes hypertension, gout, CKD stage IIIb, obesity, and GERD.  [name] [name] is retired from 35+ year career in [employer].  [name] [name] lives at home with his wife.  [name] [name] drinks occasional alcohol.  [name] [name] denies any tobacco or illicit drug use.  Patient's mother had (questionable) history of colon cancer versus precancerous colon polyps.  [name] [name] has a maternal uncle with unspecified type of cancer.   MEDICAL HISTORY:  Past Medical History:  Diagnosis Date   DVT (deep venous thrombosis) (HCC)    Gout    Hypertension    Medical history non-contributory    Pulmonary embolism ([place])    Wears glasses     SURGICAL HISTORY: Past Surgical History:  Procedure Laterality Date   COLONOSCOPY WITH PROPOFOL N/A 08/02/2019   Procedure: COLONOSCOPY WITH PROPOFOL;  Surgeon: Daneil Dolin, MD;  [facility];  Service: Endoscopy;  Laterality: N/A;  9:15am   KNEE ARTHROSCOPY WITH PATELLAR TENDON REPAIR Left 09/03/2012   Procedure: LEFT KNEE ARTHROSCOPY, LOOSE BODY EXCISION, PATELLAR TENDON REPAIR, EXCISION TIBIAL TUBERCLE SPUR, CHONDROPLASTY PATELLA, EXCISION PLICA;  Surgeon: Ninetta Lights, MD;  Location: [place];  Service: Orthopedics;  Laterality: Left;   WISDOM TOOTH EXTRACTION      SOCIAL HISTORY:  Social History   Socioeconomic History   Marital status: Married    Spouse name: Not on file   Number of children: Not on file   Years of education: Not on file   Highest education level: Not on file  Occupational History   Not on file  Tobacco Use   Smoking status: Never   Smokeless tobacco: Never  Substance and Sexual Activity   Alcohol use: Yes    Comment: rare   Drug use: No   Sexual activity: Not on file  Other  Topics Concern   Not on file  Social History Narrative   Not on file   Social Determinants of Health   Financial Resource Strain: Not on file  Food Insecurity: No Food Insecurity (03/12/2022)   Hunger Vital Sign    Worried About Running Out of Food in the Last Year: Never true    Ran Out of Food in the Last Year: Never true  Transportation Needs: No Transportation Needs (03/12/2022)   PRAPARE - Hydrologist (Medical): No    Lack of Transportation (Non-Medical): No  Physical Activity: Not on file  Stress: Not on file  Social Connections: Not on file  Intimate Partner Violence: Not At Risk (03/12/2022)   Humiliation, Afraid, Rape, and Kick questionnaire    Fear of Current or Ex-Partner: No    Emotionally Abused: No    Physically Abused: No    Sexually Abused: No    FAMILY HISTORY: Family History  Problem Relation Age of Onset   Colon cancer Mother        Precancerous colon polyps; unsure of actual CRC?   Diabetes Father    Hypertension Father    Heart attack Father     ALLERGIES:  is allergic to sulfa antibiotics.  MEDICATIONS:  Current Outpatient Medications  Medication Sig Dispense Refill   acetaminophen (TYLENOL) 500 MG tablet Take 500 mg by mouth every 6 (six) hours as needed for mild pain or headache.     allopurinol (ZYLOPRIM) 300 MG tablet Take 1 tablet (300 mg total) by mouth at bedtime. 90 tablet 3   apixaban (ELIQUIS) 5 MG TABS tablet Take 5 mg by mouth 2 (two) times daily.     chlorthalidone (HYGROTON) 25 MG tablet Take 0.5 tablets (12.5 mg total) by mouth daily. Home medication until follow-up with PCP.     Cholecalciferol (VITAMIN D-3 PO) Take 1 tablet by mouth daily.     lidocaine (LIDODERM) 5 % Place 1 patch onto the skin daily.     metoprolol succinate (TOPROL-XL) 100 MG 24 hr tablet TAKE 1 TABLET BY MOUTH EVERY DAY WITH OR IMMEDIATELY FOLLOWING A MEAL 90 tablet 1   pantoprazole (PROTONIX) 40 MG tablet Take 1 tablet (40 mg total) by  mouth daily. 30 tablet 1   tiZANidine (ZANAFLEX) 4 MG tablet Take 4 mg by mouth every 8 (eight) hours as needed for muscle spasms.     traMADol (ULTRAM) 50 MG tablet Take 1 tablet (50 mg total) by mouth every 12 (twelve) hours as needed for severe pain. 20 tablet 0   traZODone (DESYREL) 150 MG tablet Use from 1/3 to 1 tablet nightly as needed for sleep. 30 tablet 5   No current facility-administered medications for this visit.    REVIEW OF SYSTEMS:    Review of Systems  Constitutional:  Positive for fatigue (75% energy). Negative for appetite change, chills, diaphoresis, fever and unexpected weight change.  HENT:   Negative  for lump/mass and nosebleeds.   Eyes:  Negative for eye problems.  Respiratory:  Negative for cough, hemoptysis and shortness of breath.   Cardiovascular:  Positive for chest pain, leg swelling and palpitations.  Gastrointestinal:  Negative for abdominal pain, blood in stool, constipation, diarrhea, nausea and vomiting.  Genitourinary:  Negative for hematuria.   Musculoskeletal:  Positive for back pain and myalgias.  Skin: Negative.   Neurological:  Positive for dizziness. Negative for headaches and light-headedness.  Hematological:  Does not bruise/bleed easily.  Psychiatric/Behavioral:  Positive for sleep disturbance.       PHYSICAL EXAMINATION:   ECOG PERFORMANCE STATUS: 1 - Symptomatic but completely ambulatory  There were no vitals filed for this visit. There were no vitals filed for this visit.  Physical Exam Constitutional:      Appearance: Normal appearance. [name] [name] is obese.  HENT:     Head: Normocephalic and atraumatic.     Mouth/Throat:     Mouth: Mucous membranes are moist.  Eyes:     Extraocular Movements: Extraocular movements intact.     Pupils: Pupils are equal, round, and reactive to light.  Cardiovascular:     Rate and Rhythm: Normal rate and regular rhythm.     Pulses: Normal pulses.     Heart sounds: Normal heart sounds.  Pulmonary:      Effort: Pulmonary effort is normal.     Breath sounds: Normal breath sounds.  Abdominal:     General: Bowel sounds are normal.     Palpations: Abdomen is soft.     Tenderness: There is no abdominal tenderness.  Musculoskeletal:        General: No swelling.     Right lower leg: Edema (trace) present.     Left lower leg: Edema (1+) present.  Lymphadenopathy:     Cervical: No cervical adenopathy.  Skin:    General: Skin is warm and dry.  Neurological:     General: No focal deficit present.     Mental Status: [name] [name] is alert and oriented to person, place, and time.  Psychiatric:        Mood and Affect: Mood normal.        Behavior: Behavior normal.      LABORATORY DATA: I have reviewed relevant laboratory information which is located elsewhere in medical record and excluded from this note due to space constraints.  RADIOGRAPHIC STUDIES: I have personally reviewed the radiological images as listed and agreed with the findings in the report. No results found.   ASSESSMENT & PLAN:  1.  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  Initial symptoms were left leg pain followed by difficulty breathing and chest tightness and subsequent syncopal episode the following day.  On arrival to the ED, patient was hypotensive with blood pressure 70/50.  [name] [name] was found to have left leg DVT as well as PE on VQ scan with right heart strain noted on echo.  [name] [name] was admitted for IV anticoagulation and transition to Eliquis after 5 days.    [name] [name] was deemed not to be a candidate for intervention by radiology. - Venous US left leg (03/12/2022): Left femoral popliteal DVT extending into the calf veins - V/Q scan (03/13/2022): Scintigraphic findings compatible with pulmonary embolus (large perfusion defects in right lung with few bilateral small peripheral perfusion defects) - DVT/PE was unprovoked - no preceding trauma, illness,  hospitalization, surgery, or inactivity -  No known family history of coagulopathy, but patient's father had blood clot in his 60s and both of his daughters have had miscarriages; patient's mother had miscarriages well. - Tolerating Eliquis well without any bleeding events - Continues to have some trace lower extremity edema and bilateral calf soreness, but this is improving.  Intermittent sharp nonpleuritic chest pain improving after being started on Eliquis. 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 - We will check hypercoagulable workup for additional risk stratification and to assist in choice of anticoagulant.  (Beta-2 glycoprotein 1 antibodies, anticardiolipin antibodies, lupus anticoagulant panel, PT gene mutation, factor V Leiden) - Due to right calf soreness, we will also check RLE venous US to rule out DVT in that extremity - Follow-up OFFICE visit in 3-4 weeks.  2.  Other history - PMH: Hypertension, gout, CKD stage IIIb, obesity, and GERD.  - SOCIAL: [name] [name] is retired from 35+ year career in [employer]. [name] [name] lives at home with his wife. [name] [name] drinks occasional alcohol. [name] [name] denies any tobacco or illicit drug use.  - FAMILY: No known family history of coagulopathy, but reports that his father had blood clots in his 27s. One of his daughters miscarried x 2, another daughter had miscarriage x 1.  Patient's mother had miscarriage x 1.  Mother had (questionable) history of colon cancer versus precancerous colon polyps.  [name] [name] has a maternal uncle with unspecified type of cancer.    PLAN SUMMARY: >> Labs TODAY (CBC/D, CMP, lupus anticoagulant, cardiolipin antibodies, beta-2 glycoprotein, PT gene mutation, factor V Leiden, D-dimer) >> Venous US right lower extremity >> Office visit in 1 month     All questions were answered. The patient knows to call the clinic with any problems, questions or concerns.  Medical decision making:  Moderate  Time spent on visit: I spent 35 minutes counseling the patient face to face. The total time spent in the appointment was 50 minutes and more than 50% was on counseling.  I, Tarri Abernethy PA-C, have seen this patient in conjunction with Dr. Derek Jack.  Greater than 50% of visit was performed by Dr. Delton Coombes.   Harriett Rush, PA-C 04/16/2022 6:24 PM  DR. Jaydeen Odor: I have independently evaluated this patient and formulated my assessment and plan.  I agree with HPI, assessment and plan written by Casey Burkitt, PA-C.  [name] [name] has not on provoked acute DVT/PE on 03/17/2022 with evidence of right heart strain on echocardiogram.  [name] [name] is tolerating Eliquis very well.  [name] [name] also has other high risk comorbidities including hypertension, CKD, obesity.  I would recommend indefinite anticoagulation and periodic assessment of risk-benefit ratio in this patient.

## 2022-04-16 ENCOUNTER — Ambulatory Visit (HOSPITAL_COMMUNITY)
Admission: RE | Admit: 2022-04-16 | Discharge: 2022-04-16 | Disposition: A | Discharge status: Home / Self Care | Source: Ambulatory Visit | Attending: Physician Assistant | Admitting: Physician Assistant

## 2022-04-16 ENCOUNTER — Inpatient Hospital Stay: Attending: Hematology | Admitting: Hematology

## 2022-04-16 ENCOUNTER — Inpatient Hospital Stay

## 2022-04-16 ENCOUNTER — Encounter: Payer: Self-pay | Admitting: Hematology

## 2022-04-16 VITALS — BP 132/84 | HR 70 | Temp 98.0°F | Resp 18 | Ht 73.62 in | Wt 262.6 lb

## 2022-04-16 DIAGNOSIS — I82412 Acute embolism and thrombosis of left femoral vein: Secondary | ICD-10-CM

## 2022-04-16 DIAGNOSIS — M79661 Pain in right lower leg: Secondary | ICD-10-CM

## 2022-04-16 DIAGNOSIS — Z7901 Long term (current) use of anticoagulants: Secondary | ICD-10-CM | POA: Diagnosis not present

## 2022-04-16 DIAGNOSIS — M549 Dorsalgia, unspecified: Secondary | ICD-10-CM | POA: Insufficient documentation

## 2022-04-16 DIAGNOSIS — R002 Palpitations: Secondary | ICD-10-CM | POA: Insufficient documentation

## 2022-04-16 DIAGNOSIS — I2609 Other pulmonary embolism with acute cor pulmonale: Secondary | ICD-10-CM

## 2022-04-16 DIAGNOSIS — I2699 Other pulmonary embolism without acute cor pulmonale: Secondary | ICD-10-CM | POA: Diagnosis present

## 2022-04-16 LAB — CBC WITH DIFFERENTIAL/PLATELET
Abs Immature Granulocytes: 0.02 10*3/uL (ref 0.00–0.07)
Basophils Absolute: 0 10*3/uL (ref 0.0–0.1)
Basophils Relative: 1 %
Eosinophils Absolute: 0 10*3/uL (ref 0.0–0.5)
Eosinophils Relative: 0 %
HCT: 36.6 % — ABNORMAL LOW (ref 39.0–52.0)
Hemoglobin: 12.5 g/dL — ABNORMAL LOW (ref 13.0–17.0)
Immature Granulocytes: 0 %
Lymphocytes Relative: 20 %
Lymphs Abs: 1.2 10*3/uL (ref 0.7–4.0)
MCH: 30 pg (ref 26.0–34.0)
MCHC: 34.2 g/dL (ref 30.0–36.0)
MCV: 88 fL (ref 80.0–100.0)
Monocytes Absolute: 0.5 10*3/uL (ref 0.1–1.0)
Monocytes Relative: 9 %
Neutro Abs: 4.4 10*3/uL (ref 1.7–7.7)
Neutrophils Relative %: 70 %
Platelets: 192 10*3/uL (ref 150–400)
RBC: 4.16 MIL/uL — ABNORMAL LOW (ref 4.22–5.81)
RDW: 13.2 % (ref 11.5–15.5)
WBC: 6.2 10*3/uL (ref 4.0–10.5)
nRBC: 0 % (ref 0.0–0.2)

## 2022-04-16 LAB — COMPREHENSIVE METABOLIC PANEL
ALT: 24 U/L (ref 0–44)
AST: 22 U/L (ref 15–41)
Albumin: 4 g/dL (ref 3.5–5.0)
Alkaline Phosphatase: 57 U/L (ref 38–126)
Anion gap: 8 (ref 5–15)
BUN: 22 mg/dL — ABNORMAL HIGH (ref 6–20)
CO2: 27 mmol/L (ref 22–32)
Calcium: 8.9 mg/dL (ref 8.9–10.3)
Chloride: 104 mmol/L (ref 98–111)
Creatinine, Ser: 1.83 mg/dL — ABNORMAL HIGH (ref 0.61–1.24)
GFR, Estimated: 42 mL/min — ABNORMAL LOW (ref >60)
Glucose, Bld: 96 mg/dL (ref 70–99)
Potassium: 3.4 mmol/L — ABNORMAL LOW (ref 3.5–5.1)
Sodium: 139 mmol/L (ref 135–145)
Total Bilirubin: 0.5 mg/dL (ref 0.3–1.2)
Total Protein: 7.1 g/dL (ref 6.5–8.1)

## 2022-04-16 LAB — D-DIMER, QUANTITATIVE: D-Dimer, Quant: 0.66 ug/mL-FEU — ABNORMAL HIGH (ref 0.00–0.50)

## 2022-04-16 NOTE — Patient Instructions (Signed)
[place] at [place] **   You were seen today by Dr. Delton Coombes & Tarri Abernethy PA-C for your recent diagnosis of blood clots in your left leg and lungs.   Since your blood clots were "unprovoked" you will need to be on lifelong blood thinners to prevent new episodes of blood clots in the future. Do NOT stop taking your blood thinners or change your dose without speaking with [place] first. If cost is a barrier to obtaining your blood thinner medications, please call our office to discuss alternative medications or financial assistance resources. See the attached handout for important information regarding bleeding precautions while taking a blood thinner. We will check an ultrasound of your right leg to see if you also had a blood clot on that side. We will check labs today to see if you have any signs of blood clotting disorder.  LABS: Check labs today on the first floor of the hospital building  OTHER TESTS: Ultrasound of right leg  MEDICATIONS: Continue Eliquis 5 mg twice daily.  FOLLOW-UP APPOINTMENT: Office visit in 1 month to discuss lab and ultrasound results.  ** Thank you for trusting me with your healthcare!  I strive to provide all of my patients with quality care at each visit.  If you receive a survey for this visit, I would be so grateful to you for taking the time to provide feedback.  Thank you in advance!  ~ Emileo Semel                   Dr. Derek Jack   &   Tarri Abernethy, PA-C   - - - - - - - - - - - - - - - - - -    Thank you for choosing [place] at [facility] to provide your oncology and hematology care.  To afford each patient quality time with our provider, please arrive at least 15 minutes before your scheduled appointment time.   If you have a lab appointment with the [place] please come in thru the Main Entrance and check in at the main information  desk.  You need to re-schedule your appointment should you arrive 10 or more minutes late.  We strive to give you quality time with our providers, and arriving late affects you and other patients whose appointments are after yours.  Also, if you no show three or more times for appointments you may be dismissed from the clinic at the providers discretion.     Again, thank you for choosing [facility].  Our hope is that these requests will decrease the amount of time that you wait before being seen by our physicians.       _____________________________________________________________  Should you have questions after your visit to [facility], please contact our office at [phone] and follow the prompts.  Our office hours are 8:00 a.m. and 4:30 p.m. Monday - Friday.  Please note that voicemails left after 4:00 p.m. may not be returned until the following business day.  We are closed weekends and major holidays.  You do have access to a nurse 24-7, just call the main number to the clinic [phone] and do not press any options, hold on the line and a nurse will answer the phone.    For prescription refill requests, have your pharmacy contact our office and allow 72 hours.

## 2022-04-17 LAB — BETA-2-GLYCOPROTEIN I ABS, IGG/M/A
Beta-2 Glyco I IgG: <9 GPI IgG units (ref 0–20)
Beta-2-Glycoprotein I IgA: <9 GPI IgA units (ref 0–25)
Beta-2-Glycoprotein I IgM: <9 GPI IgM units (ref 0–32)

## 2022-04-18 ENCOUNTER — Ambulatory Visit: Admitting: Family Medicine

## 2022-04-18 DIAGNOSIS — M109 Gout, unspecified: Secondary | ICD-10-CM

## 2022-04-18 LAB — DRVVT MIX: dRVVT Mix: 58.4 s — ABNORMAL HIGH (ref 0.0–40.4)

## 2022-04-18 LAB — LUPUS ANTICOAGULANT PANEL
DRVVT: 87.9 s — ABNORMAL HIGH (ref 0.0–47.0)
PTT Lupus Anticoagulant: 35.1 s (ref 0.0–43.5)

## 2022-04-18 LAB — DRVVT CONFIRM: dRVVT Confirm: 1.3 ratio — ABNORMAL HIGH (ref 0.8–1.2)

## 2022-04-18 LAB — CARDIOLIPIN ANTIBODIES, IGG, IGM, IGA
Anticardiolipin IgA: <9 APL U/mL (ref 0–11)
Anticardiolipin IgG: <9 GPL U/mL (ref 0–14)
Anticardiolipin IgM: <9 MPL U/mL (ref 0–12)

## 2022-04-23 LAB — PROTHROMBIN GENE MUTATION

## 2022-04-25 LAB — FACTOR 5 LEIDEN

## 2022-04-26 ENCOUNTER — Ambulatory Visit: Attending: Cardiology | Admitting: Cardiology

## 2022-04-26 ENCOUNTER — Encounter: Payer: Self-pay | Admitting: Cardiology

## 2022-04-26 VITALS — BP 116/76 | HR 70 | Ht 73.0 in | Wt 267.2 lb

## 2022-04-26 DIAGNOSIS — I50810 Right heart failure, unspecified: Secondary | ICD-10-CM

## 2022-04-26 NOTE — Patient Instructions (Signed)
Medication Instructions:  Your physician recommends that you continue on your current medications as directed. Please refer to the Current Medication list given to you today.   Labwork: None  Testing/Procedures: Your physician has requested that you have an echocardiogram. Echocardiography is a painless test that uses sound waves to create images of your heart. It provides your doctor with information about the size and shape of your heart and how well your heart's chambers and valves are working. This procedure takes approximately one hour. There are no restrictions for this procedure. Please do NOT wear cologne, perfume, aftershave, or lotions (deodorant is allowed). Please arrive 15 minutes prior to your appointment time.   Follow-Up: Follow up with Dr. Harl Bowie is pending test results  Any Other Special Instructions Will Be Listed Below (If Applicable).     If you need a refill on your cardiac medications before your next appointment, please call your pharmacy.

## 2022-04-26 NOTE — Progress Notes (Signed)
Clinical Summary [name] [name] is a 57 y.o.male seen today as a new consult, referred by NP Lilia Pro for the following medical problems.   DVT/PE/RV strain  Jan 2024 [place] + left DVT Jan 2024 VQ scan: + PE Jan 2024 echo: LVEF 60-65%, no WMAs, normal diastolic fxn, D shaped septum, RV function severely redcued, +Mcconnels sign.   - DVT followed by vascular, they have also referred him to hematology - appears to have been unprovoked DVT/PE  - compliant with eliquis.  - no SOB/DOE, some LE edema at times that is chronic  Past Medical History:  Diagnosis Date   DVT (deep venous thrombosis) (HCC)    Gout    Hypertension    Medical history non-contributory    Pulmonary embolism (HCC)    Wears glasses      Allergies  Allergen Reactions   Sulfa Antibiotics     Unknown-was told that     Current Outpatient Medications  Medication Sig Dispense Refill   acetaminophen (TYLENOL) 500 MG tablet Take 500 mg by mouth every 6 (six) hours as needed for mild pain or headache.     allopurinol (ZYLOPRIM) 300 MG tablet Take 1 tablet (300 mg total) by mouth at bedtime. 90 tablet 3   apixaban (ELIQUIS) 5 MG TABS tablet Take 5 mg by mouth 2 (two) times daily.     chlorthalidone (HYGROTON) 25 MG tablet Take 0.5 tablets (12.5 mg total) by mouth daily. Home medication until follow-up with PCP.     Cholecalciferol (VITAMIN D-3 PO) Take 1 tablet by mouth daily.     lidocaine (LIDODERM) 5 % Place 1 patch onto the skin daily.     metoprolol succinate (TOPROL-XL) 100 MG 24 hr tablet TAKE 1 TABLET BY MOUTH EVERY DAY WITH OR IMMEDIATELY FOLLOWING A MEAL 90 tablet 1   pantoprazole (PROTONIX) 40 MG tablet Take 1 tablet (40 mg total) by mouth daily. 30 tablet 1   tiZANidine (ZANAFLEX) 4 MG tablet Take 4 mg by mouth every 8 (eight) hours as needed for muscle spasms.     traMADol (ULTRAM) 50 MG tablet Take 1 tablet (50 mg total) by mouth every 12 (twelve) hours as needed for severe pain. 20 tablet 0   traZODone  (DESYREL) 150 MG tablet Use from 1/3 to 1 tablet nightly as needed for sleep. 30 tablet 5   No current facility-administered medications for this visit.     Past Surgical History:  Procedure Laterality Date   COLONOSCOPY WITH PROPOFOL N/A 08/02/2019   Procedure: COLONOSCOPY WITH PROPOFOL;  Surgeon: Daneil Dolin, MD;  [facility];  Service: Endoscopy;  Laterality: N/A;  9:15am   KNEE ARTHROSCOPY WITH PATELLAR TENDON REPAIR Left 09/03/2012   Procedure: LEFT KNEE ARTHROSCOPY, LOOSE BODY EXCISION, PATELLAR TENDON REPAIR, EXCISION TIBIAL TUBERCLE SPUR, CHONDROPLASTY PATELLA, EXCISION PLICA;  Surgeon: Ninetta Lights, MD;  Location: [place];  Service: Orthopedics;  Laterality: Left;   WISDOM TOOTH EXTRACTION       Allergies  Allergen Reactions   Sulfa Antibiotics     Unknown-was told that      Family History  Problem Relation Age of Onset   Colon cancer Mother        Precancerous colon polyps; unsure of actual CRC?   Diabetes Father    Hypertension Father    Heart attack Father      Social History [name] [name] reports that he has never smoked. He has never used smokeless tobacco.  [name] [name] reports current alcohol use.   Review of Systems CONSTITUTIONAL: No weight loss, fever, chills, weakness or fatigue.  HEENT: Eyes: No visual loss, blurred vision, double vision or yellow sclerae.No hearing loss, sneezing, congestion, runny nose or sore throat.  SKIN: No rash or itching.  CARDIOVASCULAR: per hpi RESPIRATORY: No shortness of breath, cough or sputum.  GASTROINTESTINAL: No anorexia, nausea, vomiting or diarrhea. No abdominal pain or blood.  GENITOURINARY: No burning on urination, no polyuria NEUROLOGICAL: No headache, dizziness, syncope, paralysis, ataxia, numbness or tingling in the extremities. No change in bowel or bladder control.  MUSCULOSKELETAL: No muscle, back pain, joint pain or stiffness.  LYMPHATICS: No enlarged nodes. No history of  splenectomy.  PSYCHIATRIC: No history of depression or anxiety.  ENDOCRINOLOGIC: No reports of sweating, cold or heat intolerance. No polyuria or polydipsia.  Marland Kitchen   Physical Examination Today's Vitals   04/26/22 0904  BP: 116/76  Pulse: 70  SpO2: 94%  Weight: 267 lb 3.2 oz (121.2 kg)  Height: 6' 1"$  (1.854 m)   Body mass index is 35.25 kg/m.  Gen: resting comfortably, no acute distress HEENT: no scleral icterus, pupils equal round and reactive, no palptable cervical adenopathy,  CV: RRR, no m/r,g, no jvd Resp: Clear to auscultation bilaterally GI: abdomen is soft, non-tender, non-distended, normal bowel sounds, no hepatosplenomegaly MSK: extremities are warm, no edema.  Skin: warm, no rash Neuro:  no focal deficits Psych: appropriate affect     Assessment and Plan  1.DVT/PE/RV strain - defer anticoagulation management to hematology - RV strain at time of acute PE. We will repeat limited echo to reassess RV function   F/u pending echo results.       Arnoldo Lenis, M.D.

## 2022-05-11 NOTE — Progress Notes (Unsigned)
[name] [name], [place] [address]   CLINIC:  Medical Oncology/Hematology  PCP:  Claretta Fraise, [place] [address] [phone]   REASON FOR VISIT:  Follow-up for unprovoked DVT/PE  CURRENT THERAPY: Eliquis  INTERVAL HISTORY:   [name] 57 y.o. male returns for routine follow-up of his history of unprovoked DVT and PE.  He was seen for initial consultation by Dr. Delton Coombes and Tarri Abernethy PA-C on 04/16/2022.  At today's visit, he reports feeling fair.  He denies any changes in his baseline health status or new symptoms since his initial visit last month.     He continues to have some mild lower extremity swelling. He still has some transient (<10 seconds) sharp intermittent nonpleuritic chest pain that has been happening on and off since hospitalization.  He reports some palpitations, but denies any shortness of breath, dyspnea, cough, or hemoptysis.  He reports that he is compliant with Eliquis 5 mg twice daily and denies any major bleeding events.   He has 50% energy and 100% appetite. He endorses that he is maintaining a stable weight.   ASSESSMENT & PLAN:  1.  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  Initial symptoms were left leg pain followed by difficulty breathing and chest tightness and syncopal episode the following day.  Hypotensive in ED with blood pressure 70/50.  He was found to have left leg DVT as well as PE on VQ scan with right heart strain noted on echo. Deemed not to be a candidate for intervention by radiology. He was admitted for IV anticoagulation and transition to Eliquis after 5 days.   - Venous US left leg (03/12/2022): Left femoral popliteal DVT extending into the calf veins - V/Q scan (03/13/2022): Scintigraphic findings compatible with pulmonary embolus (large perfusion defects in right lung with few bilateral  small peripheral perfusion defects) - DVT/PE was unprovoked - no preceding trauma, illness, hospitalization, surgery, or inactivity - No known family history of coagulopathy, but patient's father had blood clot in his 41s and both of his daughters have had miscarriages; patient's mother had miscarriages well. - Coagulopathy workup (04/16/2022): D-dimer elevated at 0.66, but trending downward since >20 at time of diagnosis the month prior Factor V Leiden and prothrombin gene mutation are negative. Anticardiolipin and antibeta 2 glycoprotein antibodies are negative. 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  Intermittent sharp nonpleuritic chest pain improving after being started on Eliquis. - PLAN: Lupus anticoagulant is positive, but this is unreliable in the setting of Eliquis.  We will recheck lupus anticoagulant in 3 months and call with results. If lupus anticoagulant remains positive, will schedule patient to discuss possibly switching to warfarin. If lupus anticoagulant is negative, will call patient and let him know that he can continue to take Eliquis. - Otherwise, labs (CBC/D, BMP, D-dimer) followed by OFFICE visit in 6 months   2.  Other history - PMH: Hypertension, gout, CKD stage IIIb, obesity, and GERD.  - SOCIAL: He is retired from 35+ year career in [employer]. He lives at home with his wife. He drinks occasional alcohol. He denies any tobacco or illicit drug use.  - FAMILY: No known family history of coagulopathy, but reports that his father had  blood clots in his 73s. One of his daughters miscarried x 2, another daughter had miscarriage x 1.  Patient's mother had miscarriage x 1.  Mother had (questionable) history of colon cancer versus precancerous colon polyps.  He has a maternal uncle with unspecified type of cancer.   PLAN  SUMMARY: >> Labs in 3 months = CBC/D, CMP, D-dimer, lupus anticoagulant (CALL with results) >> Labs in 6 months = CBC/D, BMP, D-dimer >> OFFICE visit in 6 months (1 to 2 days after labs)     REVIEW OF SYSTEMS:   Review of Systems  Constitutional:  Positive for fatigue. Negative for appetite change, chills, diaphoresis, fever and unexpected weight change.  HENT:   Negative for lump/mass and nosebleeds.   Eyes:  Negative for eye problems.  Respiratory:  Negative for cough, hemoptysis and shortness of breath.   Cardiovascular:  Positive for chest pain. Negative for leg swelling and palpitations.  Gastrointestinal:  Negative for abdominal pain, blood in stool, constipation, diarrhea, nausea and vomiting.  Genitourinary:  Negative for hematuria.   Musculoskeletal:  Positive for arthralgias and back pain.  Skin: Negative.   Neurological:  Positive for dizziness and numbness. Negative for headaches and light-headedness.  Hematological:  Does not bruise/bleed easily.     PHYSICAL EXAM:  ECOG PERFORMANCE STATUS: 1 - Symptomatic but completely ambulatory  There were no vitals filed for this visit. There were no vitals filed for this visit. Physical Exam Constitutional:      Appearance: Normal appearance. He is obese.  Cardiovascular:     Heart sounds: Normal heart sounds.  Pulmonary:     Breath sounds: Normal breath sounds.  Neurological:     General: No focal deficit present.     Mental Status: Mental status is at baseline.  Psychiatric:        Behavior: Behavior normal. Behavior is cooperative.     PAST MEDICAL/SURGICAL HISTORY:  Past Medical History:  Diagnosis Date   DVT (deep venous thrombosis) ([place])    Gout    Hypertension    Medical history non-contributory    Pulmonary embolism ([place])    Wears glasses    Past Surgical History:  Procedure Laterality Date   COLONOSCOPY WITH PROPOFOL N/A 08/02/2019   Procedure: COLONOSCOPY WITH PROPOFOL;  Surgeon: Daneil Dolin, MD;   [facility];  Service: Endoscopy;  Laterality: N/A;  9:15am   KNEE ARTHROSCOPY WITH PATELLAR TENDON REPAIR Left 09/03/2012   Procedure: LEFT KNEE ARTHROSCOPY, LOOSE BODY EXCISION, PATELLAR TENDON REPAIR, EXCISION TIBIAL TUBERCLE SPUR, CHONDROPLASTY PATELLA, EXCISION PLICA;  Surgeon: Ninetta Lights, MD;  Location: [facility];  Service: Orthopedics;  Laterality: Left;   WISDOM TOOTH EXTRACTION      SOCIAL HISTORY:  Social History   Socioeconomic History   Marital status: Married    Spouse name: Not on file   Number of children: Not on file   Years of education: Not on file   Highest education level: Not on file  Occupational History   Not on file  Tobacco Use   Smoking status: Never   Smokeless tobacco: Never  Vaping Use   Vaping Use: Never used  Substance and Sexual Activity   Alcohol use: Yes    Comment: rare   Drug use: No   Sexual activity: Not on file  Other Topics Concern   Not on file  Social History Narrative   Not on file   Social Determinants of Health   Financial Resource Strain: Not  on file  Food Insecurity: No Food Insecurity (04/16/2022)   Hunger Vital Sign    Worried About Running Out of Food in the Last Year: Never true    Ran Out of Food in the Last Year: Never true  Transportation Needs: No Transportation Needs (04/16/2022)   PRAPARE - Hydrologist (Medical): No    Lack of Transportation (Non-Medical): No  Physical Activity: Not on file  Stress: Not on file  Social Connections: Not on file  Intimate Partner Violence: Not At Risk (04/16/2022)   Humiliation, Afraid, Rape, and Kick questionnaire    Fear of Current or Ex-Partner: No    Emotionally Abused: No    Physically Abused: No    Sexually Abused: No    FAMILY HISTORY:  Family History  Problem Relation Age of Onset   Colon cancer Mother        Precancerous colon polyps; unsure of actual CRC?   Diabetes Father    Hypertension Father    Heart  attack Father     CURRENT MEDICATIONS:  Outpatient Encounter Medications as of 05/13/2022  Medication Sig   acetaminophen (TYLENOL) 500 MG tablet Take 500 mg by mouth every 6 (six) hours as needed for mild pain or headache.   allopurinol (ZYLOPRIM) 300 MG tablet Take 1 tablet (300 mg total) by mouth at bedtime.   apixaban (ELIQUIS) 5 MG TABS tablet Take 5 mg by mouth 2 (two) times daily.   chlorthalidone (HYGROTON) 25 MG tablet Take 0.5 tablets (12.5 mg total) by mouth daily. Home medication until follow-up with PCP.   Cholecalciferol (VITAMIN D-3 PO) Take 1 tablet by mouth daily.   lidocaine (LIDODERM) 5 % Place 1 patch onto the skin daily.   metoprolol succinate (TOPROL-XL) 100 MG 24 hr tablet TAKE 1 TABLET BY MOUTH EVERY DAY WITH OR IMMEDIATELY FOLLOWING A MEAL   pantoprazole (PROTONIX) 40 MG tablet Take 1 tablet (40 mg total) by mouth daily.   tiZANidine (ZANAFLEX) 4 MG tablet Take 4 mg by mouth every 8 (eight) hours as needed for muscle spasms.   traMADol (ULTRAM) 50 MG tablet Take 1 tablet (50 mg total) by mouth every 12 (twelve) hours as needed for severe pain.   traZODone (DESYREL) 150 MG tablet Use from 1/3 to 1 tablet nightly as needed for sleep.   No facility-administered encounter medications on file as of 05/13/2022.    ALLERGIES:  Allergies  Allergen Reactions   Sulfa Antibiotics     Unknown-was told that    LABORATORY DATA:  I have reviewed the labs as listed.  CBC    Component Value Date/Time   WBC 6.2 04/16/2022 0949   RBC 4.16 (L) 04/16/2022 0949   HGB 12.5 (L) 04/16/2022 0949   HGB 13.8 03/20/2022 1433   HCT 36.6 (L) 04/16/2022 0949   HCT 41.2 03/20/2022 1433   PLT 192 04/16/2022 0949   PLT 270 03/20/2022 1433   MCV 88.0 04/16/2022 0949   MCV 86 03/20/2022 1433   MCH 30.0 04/16/2022 0949   MCHC 34.2 04/16/2022 0949   RDW 13.2 04/16/2022 0949   RDW 12.5 03/20/2022 1433   LYMPHSABS 1.2 04/16/2022 0949   LYMPHSABS 1.0 03/20/2022 1433   MONOABS 0.5  04/16/2022 0949   EOSABS 0.0 04/16/2022 0949   EOSABS 0.0 03/20/2022 1433   BASOSABS 0.0 04/16/2022 0949   BASOSABS 0.0 03/20/2022 1433      Latest Ref Rng & Units 04/16/2022    9:49 AM 03/20/2022  2:33 PM 03/15/2022    3:07 AM  CMP  Glucose 70 - 99 mg/dL 96  167  103   BUN 6 - 20 mg/dL 22  41  19   Creatinine 0.61 - 1.24 mg/dL 1.83  2.11  1.88   Sodium 135 - 145 mmol/L 139  135  134   Potassium 3.5 - 5.1 mmol/L 3.4  4.9  3.7   Chloride 98 - 111 mmol/L 104  98  101   CO2 22 - 32 mmol/L '27  22  25   '$ Calcium 8.9 - 10.3 mg/dL 8.9  9.8  8.6   Total Protein 6.5 - 8.1 g/dL 7.1     Total Bilirubin 0.3 - 1.2 mg/dL 0.5     Alkaline Phos 38 - 126 U/L 57     AST 15 - 41 U/L 22     ALT 0 - 44 U/L 24       DIAGNOSTIC IMAGING:  I have independently reviewed the relevant imaging and discussed with the patient.   WRAP UP:  All questions were answered. The patient knows to call the clinic with any problems, questions or concerns.  Medical decision making: Low  Time spent on visit: I spent 15 minutes counseling the patient face to face. The total time spent in the appointment was 22 minutes and more than 50% was on counseling.  Harriett Rush, PA-C  05/13/22 8:57 AM

## 2022-05-13 ENCOUNTER — Inpatient Hospital Stay: Attending: Hematology | Admitting: Physician Assistant

## 2022-05-13 VITALS — BP 119/83 | HR 65 | Temp 98.3°F | Resp 16 | Wt 259.0 lb

## 2022-05-13 DIAGNOSIS — Z86711 Personal history of pulmonary embolism: Secondary | ICD-10-CM | POA: Insufficient documentation

## 2022-05-13 DIAGNOSIS — Z86718 Personal history of other venous thrombosis and embolism: Secondary | ICD-10-CM | POA: Insufficient documentation

## 2022-05-13 DIAGNOSIS — Z7901 Long term (current) use of anticoagulants: Secondary | ICD-10-CM | POA: Insufficient documentation

## 2022-05-13 DIAGNOSIS — E669 Obesity, unspecified: Secondary | ICD-10-CM | POA: Insufficient documentation

## 2022-05-13 DIAGNOSIS — N1832 Chronic kidney disease, stage 3b: Secondary | ICD-10-CM | POA: Diagnosis not present

## 2022-05-13 DIAGNOSIS — K219 Gastro-esophageal reflux disease without esophagitis: Secondary | ICD-10-CM | POA: Diagnosis not present

## 2022-05-13 DIAGNOSIS — I2609 Other pulmonary embolism with acute cor pulmonale: Secondary | ICD-10-CM

## 2022-05-13 DIAGNOSIS — R7989 Other specified abnormal findings of blood chemistry: Secondary | ICD-10-CM | POA: Insufficient documentation

## 2022-05-13 DIAGNOSIS — Z79899 Other long term (current) drug therapy: Secondary | ICD-10-CM | POA: Diagnosis not present

## 2022-05-13 DIAGNOSIS — I129 Hypertensive chronic kidney disease with stage 1 through stage 4 chronic kidney disease, or unspecified chronic kidney disease: Secondary | ICD-10-CM | POA: Diagnosis not present

## 2022-05-13 NOTE — Patient Instructions (Addendum)
[facility] [place] **   You were seen today by Tarri Abernethy PA-C for your recent diagnosis of blood clots in your left leg and lungs.   Since your blood clots were "unprovoked" you will need to be on lifelong blood thinners to prevent new episodes of blood clots in the future. Do NOT stop taking your blood thinners or change your dose without speaking with [place] first. If cost is a barrier to obtaining your blood thinner medications, please call our office to discuss alternative medications or financial assistance resources. See the attached handout for important information regarding bleeding precautions while taking a blood thinner, and for information regarding symptoms of recurrent blood clots that would need immediate medical attention. Your labs showed positive lupus anticoagulant.  This means that you may have a blood clotting condition that makes you more likely to form blood clots. HOWEVER - this test is unreliable in the setting of Eliquis. Therefore, we will need to check those labs again in 3 months.  We will call you to discuss those results and with further instructions. Otherwise, we will see you for an office visit again in 6 months.  LABS: Labs in 3 months and again in 6 months  MEDICATIONS: Continue Eliquis 5 mg twice daily.  FOLLOW-UP APPOINTMENT: Office visit in 6 months  ** Thank you for trusting me with your healthcare!  I strive to provide all of my patients with quality care at each visit.  If you receive a survey for this visit, I would be so grateful to you for taking the time to provide feedback.  Thank you in advance!  ~ Jemarcus Dougal                   Dr. Derek Jack   &   Tarri Abernethy, PA-C   - - - - - - - - - - - - - - - - - -    Thank you for choosing [facility] to provide your oncology and hematology care.  To afford each patient quality  time with our provider, please arrive at least 15 minutes before your scheduled appointment time.   If you have a lab appointment with the [facility] please come in thru the Main Entrance and check in at the main information desk.  You need to re-schedule your appointment should you arrive 10 or more minutes late.  We strive to give you quality time with our providers, and arriving late affects you and other patients whose appointments are after yours.  Also, if you no show three or more times for appointments you may be dismissed from the clinic at the providers discretion.     Again, thank you for choosing [facility].  Our hope is that these requests will decrease the amount of time that you wait before being seen by our physicians.       _____________________________________________________________  Should you have questions after your visit to [facility], please contact our office at [phone] and follow the prompts.  Our office hours are 8:00 a.m. and 4:30 p.m. Monday - Friday.  Please note that voicemails left after 4:00 p.m. may not be returned until the following business day.  We are closed weekends and major holidays.  You do have access to a nurse 24-7, just call the main number to the clinic [phone] and do not press  any options, hold on the line and a nurse will answer the phone.    For prescription refill requests, have your pharmacy contact our office and allow 72 hours.

## 2022-05-15 ENCOUNTER — Ambulatory Visit (INDEPENDENT_AMBULATORY_CARE_PROVIDER_SITE_OTHER): Admitting: Family Medicine

## 2022-05-15 ENCOUNTER — Encounter: Payer: Self-pay | Admitting: Family Medicine

## 2022-05-15 VITALS — BP 110/70 | HR 73 | Temp 97.8°F | Ht 73.0 in | Wt 261.0 lb

## 2022-05-15 DIAGNOSIS — I10 Essential (primary) hypertension: Secondary | ICD-10-CM | POA: Diagnosis not present

## 2022-05-15 DIAGNOSIS — E785 Hyperlipidemia, unspecified: Secondary | ICD-10-CM

## 2022-05-15 DIAGNOSIS — M109 Gout, unspecified: Secondary | ICD-10-CM | POA: Diagnosis not present

## 2022-05-15 LAB — LIPID PANEL

## 2022-05-15 MED ORDER — METOPROLOL SUCCINATE ER 100 MG PO TB24: ORAL_TABLET | ORAL | 3 refills | Status: DC

## 2022-05-15 MED ORDER — TRAZODONE HCL 150 MG PO TABS: ORAL_TABLET | ORAL | 3 refills | Status: DC

## 2022-05-15 MED ORDER — ALLOPURINOL 300 MG PO TABS: 300.0000 mg | ORAL_TABLET | Freq: Every day | ORAL | 3 refills | Status: DC

## 2022-05-15 NOTE — Progress Notes (Signed)
Subjective:  Patient ID: [name] J Iran Jr., male    DOB: 21-Oct-1965  Age: 57 y.o. MRN: [id number]  CC: Medical Management of Chronic Issues   HPI [name] J Iran Jr. presents for Had blood clot. Caused him to pass out in early January. Got dyspneic, then passed out. Came to with son bent over him asking if [name] [name] was alright. EMT called. BP went to 70/50 Had DVT and P.E.  Dx with antiphospholipid syndrome. 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      05/15/2022    8:45 AM 04/16/2022    9:20 AM 03/20/2022    1:49 PM  Depression screen PHQ 2/9  Decreased Interest 0 0 0  Down, Depressed, Hopeless 0 0 0  PHQ - 2 Score 0 0 0  Altered sleeping   1  Tired, decreased energy   1  Change in appetite   0  Feeling bad or failure about yourself    0  Trouble concentrating   0  Moving slowly or fidgety/restless   0  Suicidal thoughts   0  PHQ-9 Score   2  Difficult doing work/chores   Somewhat difficult    History [name] [name] has a past medical history of DVT (deep venous thrombosis) ([place]), Gout, Hypertension, Medical history non-contributory, Pulmonary embolism ([place]), and Wears glasses.   [name] [name] has a past surgical history that includes Wisdom tooth extraction; Knee arthroscopy with patellar tendon repair (Left, 09/03/2012); and Colonoscopy with propofol (N/A, 08/02/2019).   [name] [name] family history includes Colon cancer in [name] [name] mother; Diabetes in [name] [name] father; Heart attack in [name] [name] father; Hypertension in [name] [name] father.[name] [name] reports that [name] [name] has never smoked. [name] [name] has never used smokeless tobacco. [name] [name] reports current alcohol use. [name] [name] reports that [name] [name] does not use drugs.    ROS Review of Systems  Constitutional:  Negative for fever.  Respiratory:  Negative for shortness of breath.   Cardiovascular:  Negative for chest pain.  Musculoskeletal:  Negative for arthralgias.  Skin:  Negative for rash.    Objective:  BP 110/70   Pulse 73   Temp 97.8 F (36.6 C)   Ht '6\' 1"'$  (1.854 m)   Wt 261 lb (118.4 kg)   SpO2 96%   BMI 34.43 kg/m    BP Readings from Last 3 Encounters:  05/15/22 110/70  05/13/22 119/83  04/26/22 116/76    Wt Readings from Last 3 Encounters:  05/15/22 261 lb (118.4 kg)  05/13/22 259 lb 0.7 oz (117.5 kg)  04/26/22 267 lb 3.2 oz (121.2 kg)     Physical Exam Vitals reviewed.  Constitutional:      Appearance: [name] [name] is well-developed.  HENT:     Head: Normocephalic and atraumatic.     Right Ear: External ear normal.     Left Ear: External ear normal.     Mouth/Throat:     Pharynx: No oropharyngeal exudate or posterior oropharyngeal erythema.  Eyes:     Pupils: Pupils are equal, round, and reactive to light.  Cardiovascular:     Rate and Rhythm: Normal rate and regular rhythm.     Heart sounds: No murmur heard. Pulmonary:     Effort: No respiratory distress.     Breath sounds: Normal breath sounds.  Musculoskeletal:     Cervical back: Normal range of motion and neck supple.  Neurological:     Mental Status: [name] [name] is alert and oriented to person, place, and time.       Assessment & Plan:   [name] [name] was seen today  for medical management of chronic issues.  Diagnoses and all orders for this visit:  Primary hypertension -     CMP14+EGFR  Hyperlipidemia, unspecified hyperlipidemia type -     Lipid panel  Gout of foot, unspecified cause, unspecified chronicity, unspecified laterality -     allopurinol (ZYLOPRIM) 300 MG tablet; Take 1 tablet (300 mg total) by mouth at bedtime. -     Uric acid  Other orders -     metoprolol succinate (TOPROL-XL) 100 MG 24 hr tablet; TAKE 1 TABLET BY MOUTH EVERY DAY WITH OR IMMEDIATELY FOLLOWING A MEAL -     traZODone (DESYREL) 150 MG tablet; Use from 1/3 to 1 tablet nightly as needed for sleep.       I have discontinued [name] J. Iran Jr.'s traMADol. I am also having him maintain [name] [name] acetaminophen, tiZANidine, Cholecalciferol (VITAMIN D-3 PO), chlorthalidone, pantoprazole, lidocaine, apixaban, allopurinol, metoprolol succinate, and  traZODone.  Allergies as of 05/15/2022       Reactions   Sulfa Antibiotics    Unknown-was told that        Medication List        Accurate as of May 15, 2022  9:22 PM. If you have any questions, ask your nurse or doctor.          STOP taking these medications    traMADol 50 MG tablet Commonly known as: ULTRAM Stopped by: Claretta Fraise, MD       TAKE these medications    acetaminophen 500 MG tablet Commonly known as: TYLENOL Take 500 mg by mouth every 6 (six) hours as needed for mild pain or headache.   allopurinol 300 MG tablet Commonly known as: ZYLOPRIM Take 1 tablet (300 mg total) by mouth at bedtime.   apixaban 5 MG Tabs tablet Commonly known as: ELIQUIS Take 5 mg by mouth 2 (two) times daily.   chlorthalidone 25 MG tablet Commonly known as: HYGROTON Take 0.5 tablets (12.5 mg total) by mouth daily. Home medication until follow-up with PCP.   lidocaine 5 % Commonly known as: LIDODERM Place 1 patch onto the skin daily.   metoprolol succinate 100 MG 24 hr tablet Commonly known as: TOPROL-XL TAKE 1 TABLET BY MOUTH EVERY DAY WITH OR IMMEDIATELY FOLLOWING A MEAL   pantoprazole 40 MG tablet Commonly known as: PROTONIX Take 1 tablet (40 mg total) by mouth daily.   tiZANidine 4 MG tablet Commonly known as: ZANAFLEX Take 4 mg by mouth every 8 (eight) hours as needed for muscle spasms.   traZODone 150 MG tablet Commonly known as: DESYREL Use from 1/3 to 1 tablet nightly as needed for sleep.   VITAMIN D-3 PO Take 1 tablet by mouth daily.         Follow-up: Return in about 3 months (around 08/15/2022).  Claretta Fraise, M.D.

## 2022-05-16 LAB — CMP14+EGFR
ALT: 21 IU/L (ref 0–44)
AST: 21 IU/L (ref 0–40)
Albumin/Globulin Ratio: 1.8 (ref 1.2–2.2)
Albumin: 4.4 g/dL (ref 3.8–4.9)
Alkaline Phosphatase: 66 IU/L (ref 44–121)
BUN/Creatinine Ratio: 11 (ref 9–20)
BUN: 22 mg/dL (ref 6–24)
Bilirubin Total: 0.4 mg/dL (ref 0.0–1.2)
CO2: 23 mmol/L (ref 20–29)
Calcium: 9.5 mg/dL (ref 8.7–10.2)
Chloride: 103 mmol/L (ref 96–106)
Creatinine, Ser: 1.93 mg/dL — ABNORMAL HIGH (ref 0.76–1.27)
Globulin, Total: 2.4 g/dL (ref 1.5–4.5)
Glucose: 116 mg/dL — ABNORMAL HIGH (ref 70–99)
Potassium: 3.5 mmol/L (ref 3.5–5.2)
Sodium: 141 mmol/L (ref 134–144)
Total Protein: 6.8 g/dL (ref 6.0–8.5)
eGFR: 40 mL/min/{1.73_m2} — ABNORMAL LOW (ref >59)

## 2022-05-16 LAB — LIPID PANEL
Chol/HDL Ratio: 3.4 ratio (ref 0.0–5.0)
Cholesterol, Total: 128 mg/dL (ref 100–199)
HDL: 38 mg/dL — ABNORMAL LOW (ref >39)
LDL Chol Calc (NIH): 73 mg/dL (ref 0–99)
Triglycerides: 85 mg/dL (ref 0–149)
VLDL Cholesterol Cal: 17 mg/dL (ref 5–40)

## 2022-05-16 LAB — URIC ACID: Uric Acid: 5.9 mg/dL (ref 3.8–8.4)

## 2022-05-29 ENCOUNTER — Ambulatory Visit (HOSPITAL_COMMUNITY)
Admission: RE | Admit: 2022-05-29 | Discharge: 2022-05-29 | Disposition: A | Discharge status: Home / Self Care | Source: Ambulatory Visit | Attending: Cardiology | Admitting: Cardiology

## 2022-05-29 DIAGNOSIS — I50810 Right heart failure, unspecified: Secondary | ICD-10-CM

## 2022-05-29 LAB — ECHOCARDIOGRAM LIMITED: S' Lateral: 2.9 cm

## 2022-05-29 NOTE — Progress Notes (Signed)
*  PRELIMINARY RESULTS* Echocardiogram Limited 2-D Echocardiogram  has been performed.  [name] [name] 05/29/2022, 9:42 AM

## 2022-08-13 ENCOUNTER — Inpatient Hospital Stay: Attending: Hematology

## 2022-08-13 DIAGNOSIS — Z86718 Personal history of other venous thrombosis and embolism: Secondary | ICD-10-CM | POA: Diagnosis present

## 2022-08-13 DIAGNOSIS — Z86711 Personal history of pulmonary embolism: Secondary | ICD-10-CM | POA: Insufficient documentation

## 2022-08-13 DIAGNOSIS — I2609 Other pulmonary embolism with acute cor pulmonale: Secondary | ICD-10-CM

## 2022-08-13 DIAGNOSIS — Z7901 Long term (current) use of anticoagulants: Secondary | ICD-10-CM | POA: Diagnosis not present

## 2022-08-13 LAB — CBC WITH DIFFERENTIAL/PLATELET
Abs Immature Granulocytes: 0.02 10*3/uL (ref 0.00–0.07)
Basophils Absolute: 0 10*3/uL (ref 0.0–0.1)
Basophils Relative: 0 %
Eosinophils Absolute: 0 10*3/uL (ref 0.0–0.5)
Eosinophils Relative: 0 %
HCT: 39.9 % (ref 39.0–52.0)
Hemoglobin: 13.4 g/dL (ref 13.0–17.0)
Immature Granulocytes: 0 %
Lymphocytes Relative: 13 %
Lymphs Abs: 0.9 10*3/uL (ref 0.7–4.0)
MCH: 29.5 pg (ref 26.0–34.0)
MCHC: 33.6 g/dL (ref 30.0–36.0)
MCV: 87.9 fL (ref 80.0–100.0)
Monocytes Absolute: 0.7 10*3/uL (ref 0.1–1.0)
Monocytes Relative: 10 %
Neutro Abs: 5.3 10*3/uL (ref 1.7–7.7)
Neutrophils Relative %: 77 %
Platelets: 205 10*3/uL (ref 150–400)
RBC: 4.54 MIL/uL (ref 4.22–5.81)
RDW: 13.1 % (ref 11.5–15.5)
WBC: 6.9 10*3/uL (ref 4.0–10.5)
nRBC: 0 % (ref 0.0–0.2)

## 2022-08-13 LAB — COMPREHENSIVE METABOLIC PANEL
ALT: 29 U/L (ref 0–44)
AST: 26 U/L (ref 15–41)
Albumin: 4.1 g/dL (ref 3.5–5.0)
Alkaline Phosphatase: 66 U/L (ref 38–126)
Anion gap: 8 (ref 5–15)
BUN: 25 mg/dL — ABNORMAL HIGH (ref 6–20)
CO2: 25 mmol/L (ref 22–32)
Calcium: 9.1 mg/dL (ref 8.9–10.3)
Chloride: 101 mmol/L (ref 98–111)
Creatinine, Ser: 1.88 mg/dL — ABNORMAL HIGH (ref 0.61–1.24)
GFR, Estimated: 41 mL/min — ABNORMAL LOW (ref >60)
Glucose, Bld: 146 mg/dL — ABNORMAL HIGH (ref 70–99)
Potassium: 3.5 mmol/L (ref 3.5–5.1)
Sodium: 134 mmol/L — ABNORMAL LOW (ref 135–145)
Total Bilirubin: 0.8 mg/dL (ref 0.3–1.2)
Total Protein: 7.2 g/dL (ref 6.5–8.1)

## 2022-08-13 LAB — D-DIMER, QUANTITATIVE: D-Dimer, Quant: 0.47 ug/mL-FEU (ref 0.00–0.50)

## 2022-08-14 LAB — DRVVT MIX: dRVVT Mix: 51.5 s — ABNORMAL HIGH (ref 0.0–40.4)

## 2022-08-14 LAB — LUPUS ANTICOAGULANT PANEL
DRVVT: 76.3 s — ABNORMAL HIGH (ref 0.0–47.0)
PTT Lupus Anticoagulant: 40.7 s (ref 0.0–43.5)

## 2022-08-14 LAB — DRVVT CONFIRM: dRVVT Confirm: 1.2 ratio (ref 0.8–1.2)

## 2022-08-16 ENCOUNTER — Encounter: Payer: Self-pay | Admitting: Physician Assistant

## 2022-08-16 NOTE — Progress Notes (Signed)
Repeat lupus anticoagulant panel was negative for presence of lupus anticoagulant. Patient notified via MyChart message that he can continue to take Eliquis as prescribed. We will see him as scheduled for follow-up in September 2024.  Carnella Guadalajara, PA-C  08/16/22 10:30 AM

## 2022-09-21 IMAGING — DX DG KNEE 1-2V*L*
2 series · 2 of 2 positions shown · non-contrast
Comparison: None.

CLINICAL DATA: Knee pain.

EXAM:
LEFT KNEE - 1-2 VIEW

[knee lat]
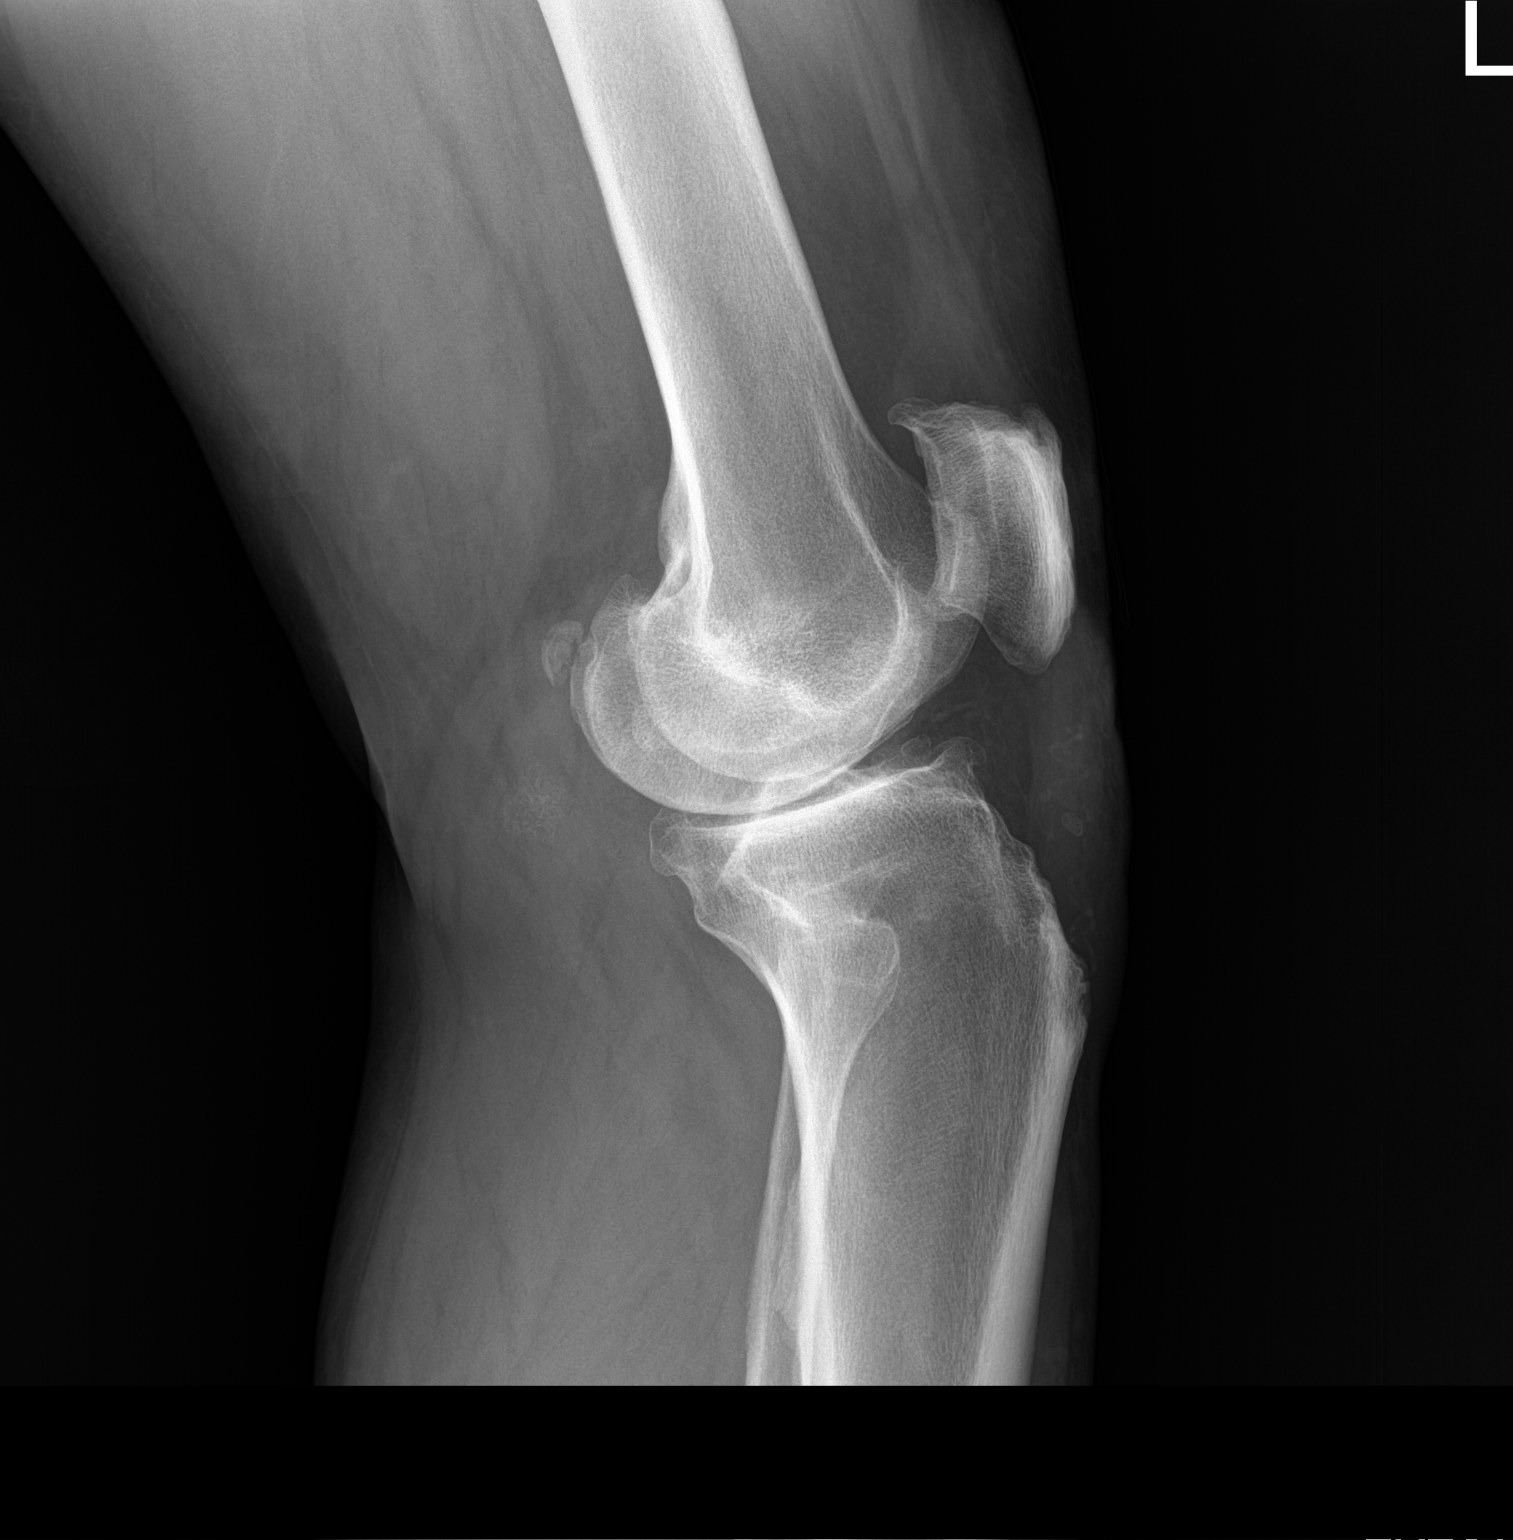

[knee ap]
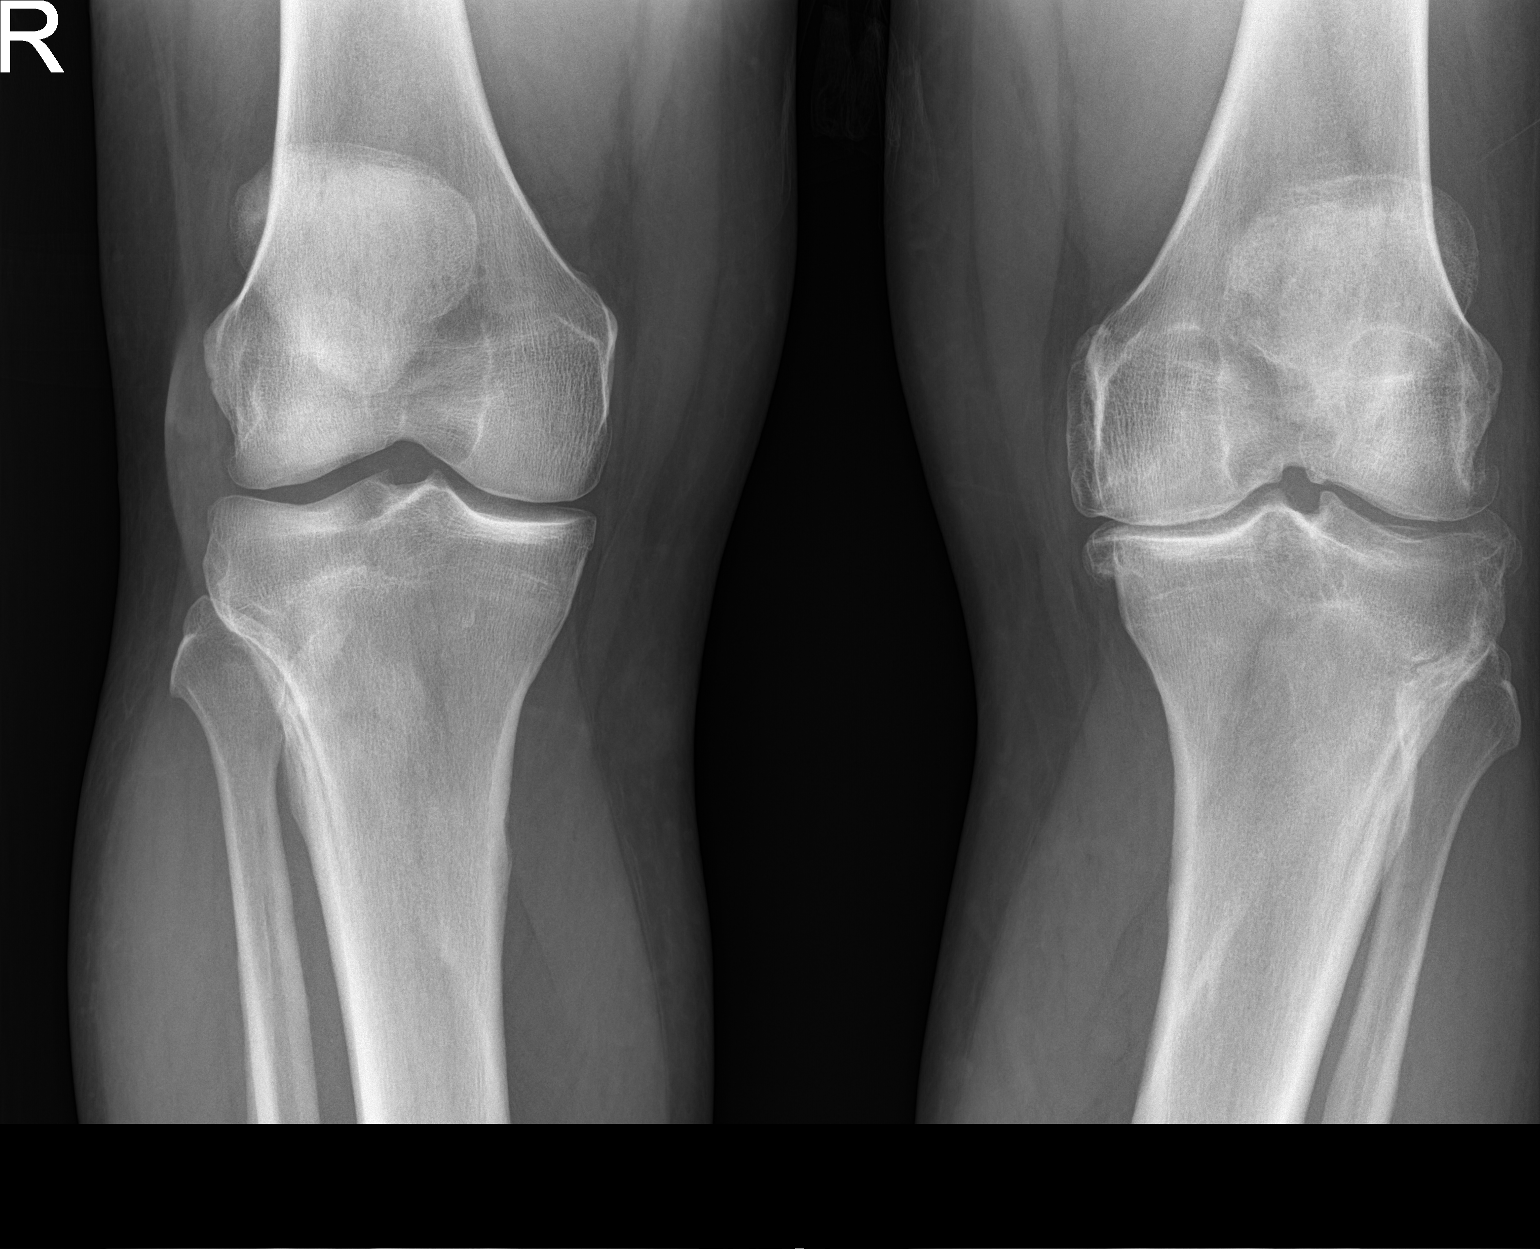

[2 of 2 positions shown; findings below may reference images not displayed]

FINDINGS: A curvilinear lucency is seen extending through a small accessory
ossicle adjacent to the posterior aspect of the distal left femur.
The remaining visualized osseous structures are intact. There is no
evidence of dislocation. Moderate to marked severity medial and
lateral tibiofemoral compartment space narrowing is seen with
marginal osteophytes noted along the distal right femur and proximal
right tibia. Soft tissues are unremarkable.
IMPRESSION: 1. Moderate to marked severity degenerative changes, as described
above.
2. Cortical irregularity involving an accessory ossicle of the left
knee which is of indeterminate age.

## 2022-09-21 IMAGING — DX DG KNEE 1-2V*R*
2 series · 2 of 2 positions shown · non-contrast
Comparison: None.

CLINICAL DATA: Knee pain.

EXAM:
RIGHT KNEE - 1-2 VIEW

[knee ap]
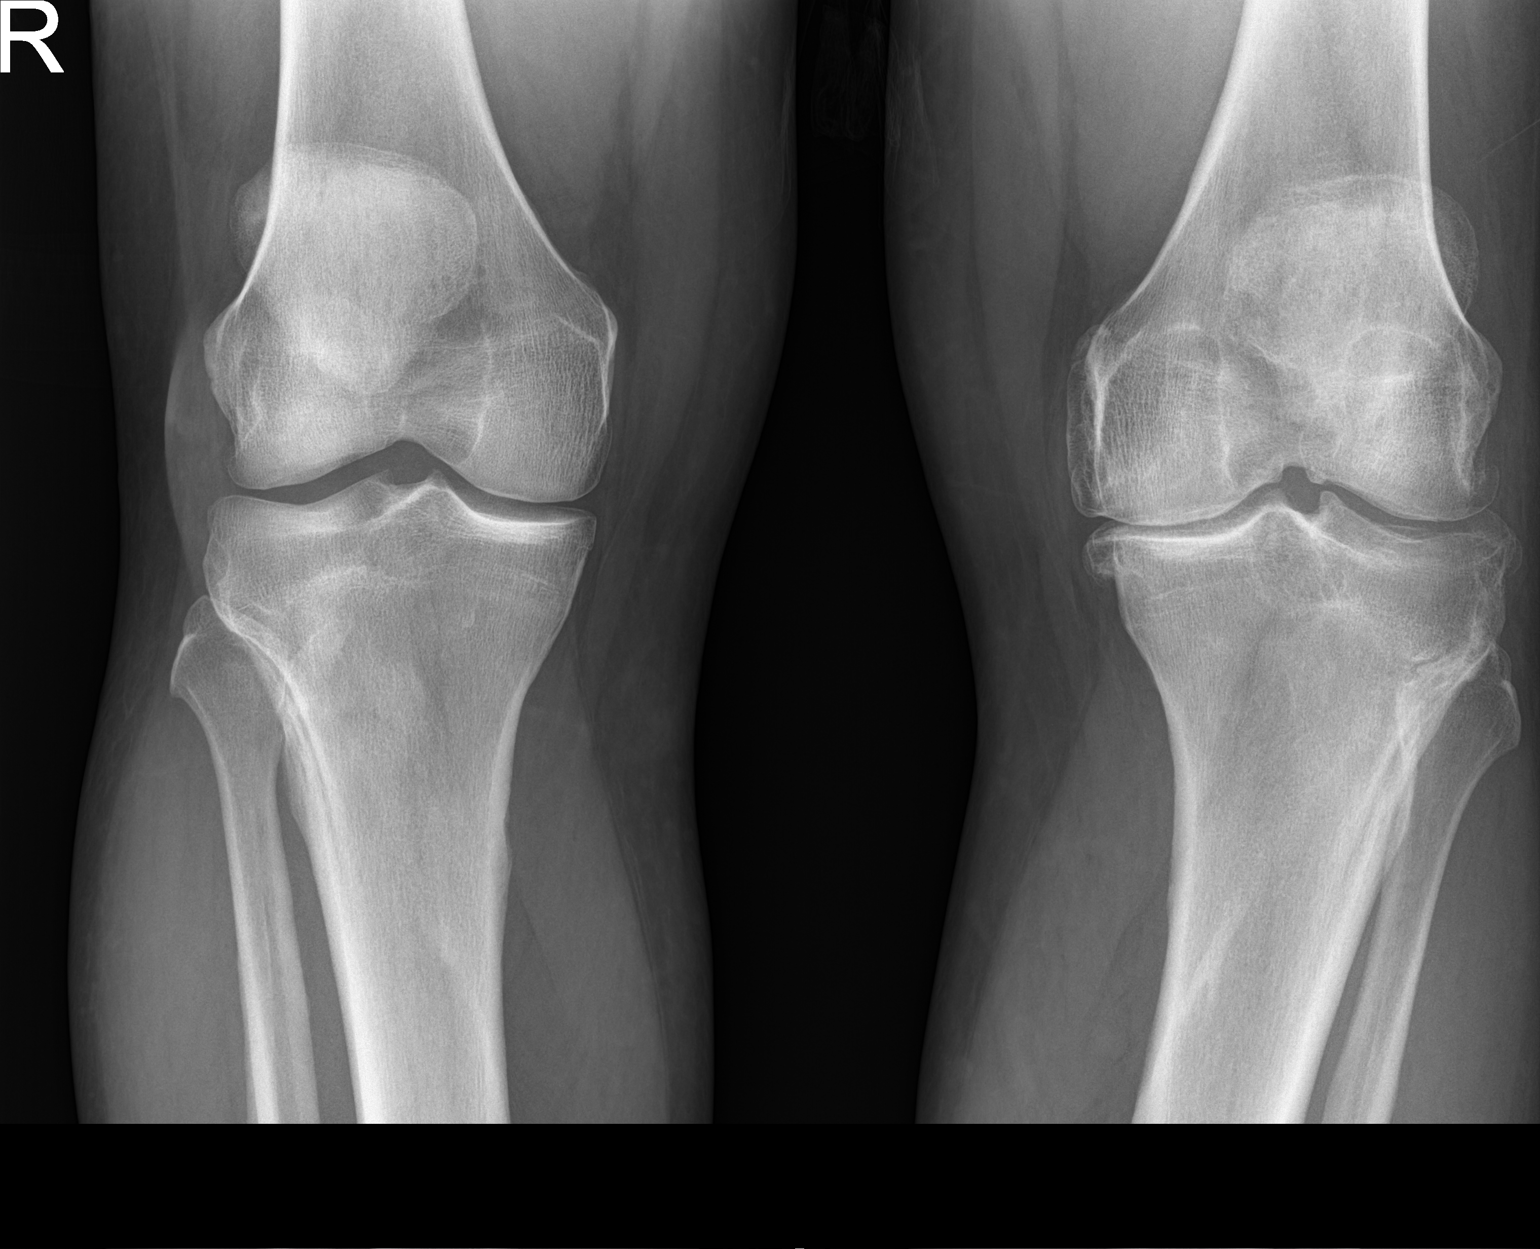

[knee lat]
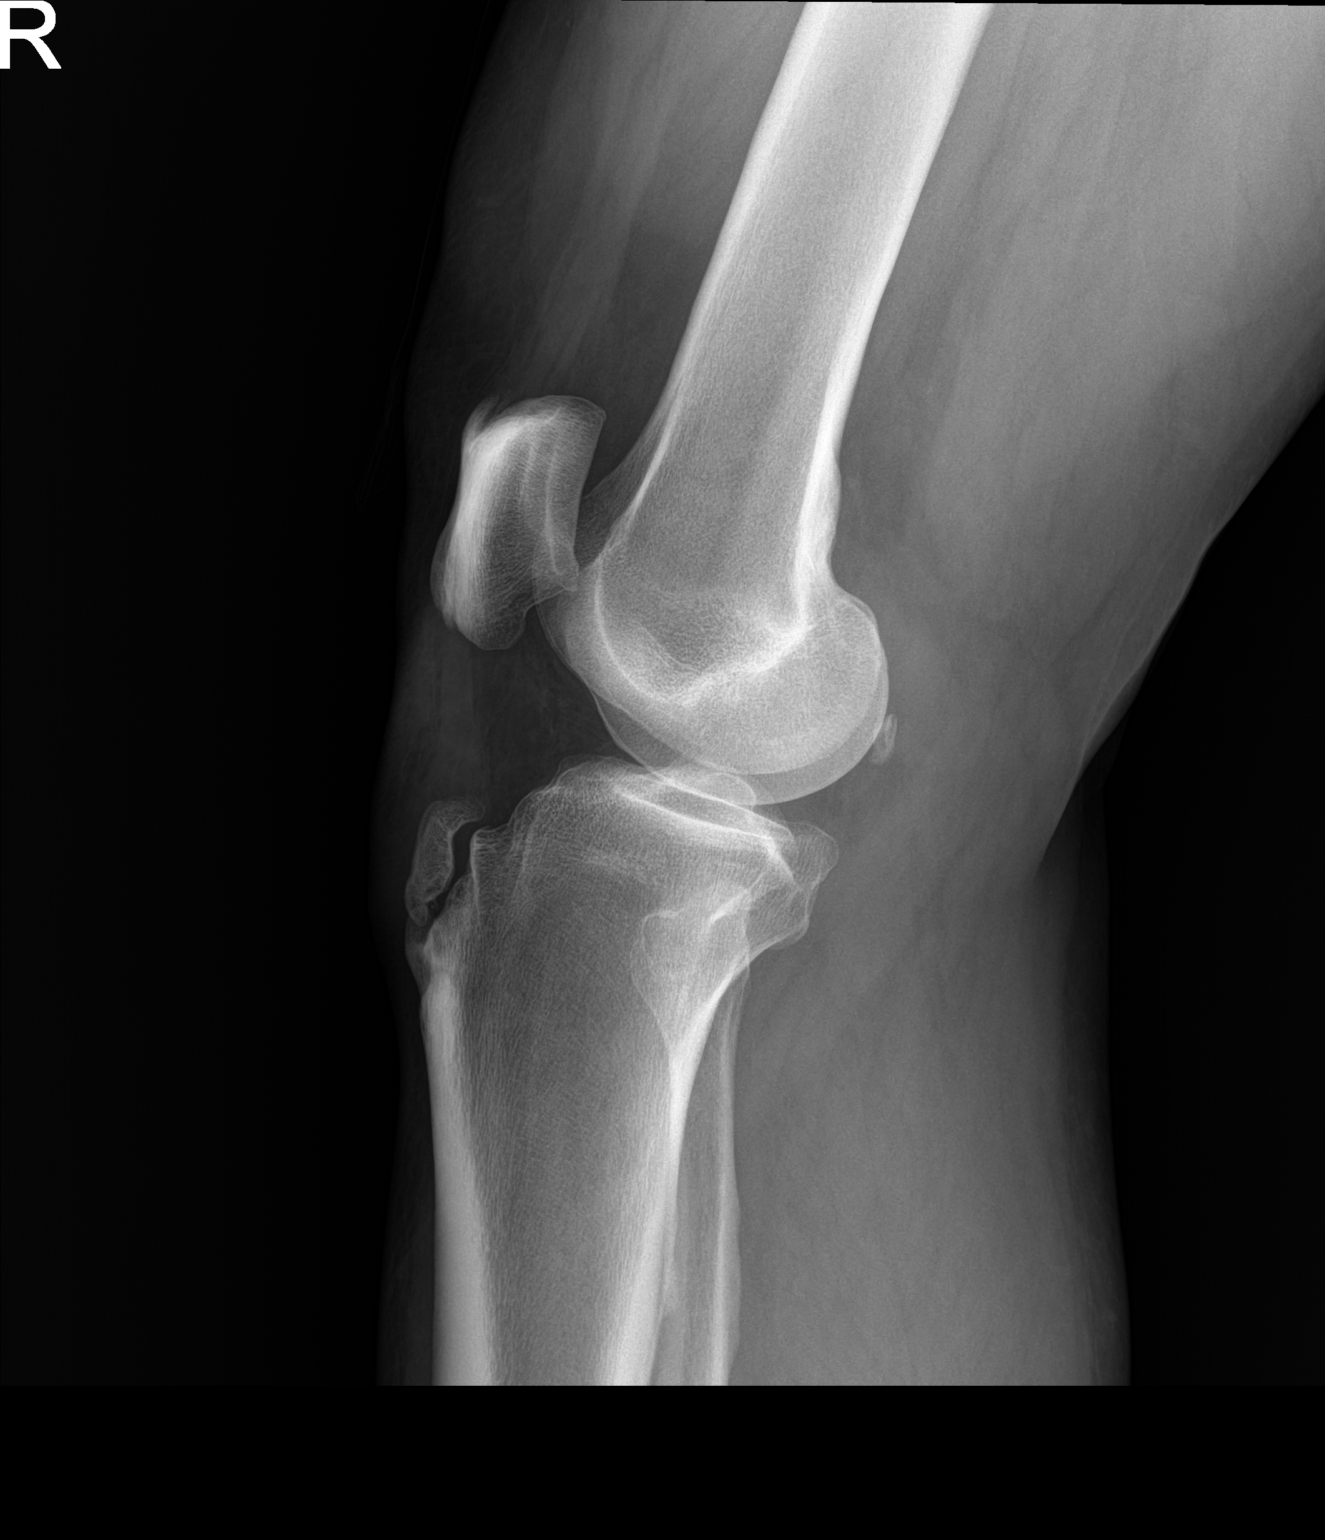

[2 of 2 positions shown; findings below may reference images not displayed]

FINDINGS: No evidence of an acute fracture, dislocation, or joint effusion. A
chronic deformity is seen involving the right tibial tuberosity.
Soft tissues are unremarkable.
IMPRESSION: Chronic changes without an acute osseous abnormality.

## 2022-10-10 ENCOUNTER — Ambulatory Visit: Attending: Student | Admitting: Student

## 2022-10-10 ENCOUNTER — Encounter: Payer: Self-pay | Admitting: Student

## 2022-10-10 VITALS — BP 122/66 | HR 74 | Ht 73.0 in | Wt 257.0 lb

## 2022-10-10 DIAGNOSIS — I10 Essential (primary) hypertension: Secondary | ICD-10-CM

## 2022-10-10 DIAGNOSIS — I50810 Right heart failure, unspecified: Secondary | ICD-10-CM | POA: Diagnosis not present

## 2022-10-10 DIAGNOSIS — Z86711 Personal history of pulmonary embolism: Secondary | ICD-10-CM

## 2022-10-10 DIAGNOSIS — N1832 Chronic kidney disease, stage 3b: Secondary | ICD-10-CM | POA: Diagnosis not present

## 2022-10-10 NOTE — Patient Instructions (Signed)
Medication Instructions:  Your physician recommends that you continue on your current medications as directed. Please refer to the Current Medication list given to you today.   Labwork: None today  Testing/Procedures:Your physician has requested that you have an echocardiogram in 1 year. Echocardiography is a painless test that uses sound waves to create images of your heart. It provides your doctor with information about the size and shape of your heart and how well your heart's chambers and valves are working. This procedure takes approximately one hour. There are no restrictions for this procedure. Please do NOT wear cologne, perfume, aftershave, or lotions (deodorant is allowed). Please arrive 15 minutes prior to your appointment time.   Follow-Up: 1 year with Dr.Branch after Echo  Any Other Special Instructions Will Be Listed Below (If Applicable).  If you need a refill on your cardiac medications before your next appointment, please call your pharmacy.

## 2022-10-10 NOTE — Progress Notes (Signed)
Cardiology Office Note    Date:  10/10/2022  ID:  Trigg J Guinea-Bissau Jr., DOB 1966/03/02, MRN [id number] Cardiologist: Dina Rich, MD    History of Present Illness:    Monnie J Guinea-Bissau Jr. is a 57 y.o. male with history of PE/DVT with right ventricular failure, HTN, Stage 3 CKD, OSA and gout who presents to the office today for 9-month follow-up.  He was last examined by Dr. Wyline Mood in 04/2022 and had recently been diagnosed with a PE/DVT in 03/2022 and echocardiogram at that time showed a preserved EF of 60 to 65% but he did have severely reduced RV function. A limited echocardiogram was recommended for reassessment of his RV function. This was obtained in 05/2022 and showed his RV function was only mildly reduced and EF remained normal at 60 to 65%. It was recommended to obtain a repeat echocardiogram in the future to ensure complete recovery.  In talking with the patient today, he reports overall doing well from a cardiac perspective. He denies any recent chest pain or dyspnea on exertion. No recent palpitations, orthopnea or PND. He does experience intermittent lower extremity edema and takes Chlorthalidone 12.5 mg daily. He does not add salt to his food routinely but does eat out at [employer]. 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 He does have upcoming follow-up with the [place]. Also says he has been without his CPAP and is awaiting new parts for this.  Studies Reviewed:   EKG: EKG is not ordered today.  Limited Echo: 05/2022 IMPRESSIONS     1. Left ventricular ejection fraction, by estimation, is 60 to 65%. The  left ventricle has normal function. The left ventricle has no regional  wall motion abnormalities. There is mild asymmetric left ventricular  hypertrophy of the basal-septal segment.  There is no flattening of the intraventricular septum in systole or  diastole.   2. Right ventricular systolic function is mildly reduced  in the mid wall  seen on subcostal views. The right ventricular size is moderately  enlarged. Tricuspid regurgitation signal is inadequate for assessing PA  pressure.   3. The mitral valve is grossly normal. No evidence of mitral stenosis.   4. Is dilated pulmonary artery.   5. The inferior vena cava is normal in size with greater than 50%  respiratory variability, suggesting right atrial pressure of 3 mmHg.   Comparison(s): Prior images reviewed side by side. RV is improving from  prior study.   Physical Exam:   VS:  BP 122/66   Pulse 74   Ht 6\' 1"  (1.854 m)   Wt 257 lb (116.6 kg)   SpO2 97%   BMI 33.91 kg/m    Wt Readings from Last 3 Encounters:  10/10/22 257 lb (116.6 kg)  05/15/22 261 lb (118.4 kg)  05/13/22 259 lb 0.7 oz (117.5 kg)     GEN: Well nourished, well developed male appearing in no acute distress NECK: No JVD; No carotid bruits CARDIAC: RRR, no murmurs, rubs, gallops RESPIRATORY:  Clear to auscultation without rales, wheezing or rhonchi  ABDOMEN: Appears non-distended. No obvious abdominal masses. EXTREMITIES: No clubbing or cyanosis. Trace ankle edema bilaterally.  Distal pedal pulses are 2+ bilaterally.   Assessment and Plan:   1. RV Failure - This occurred in the setting of a PE and most recent limited echocardiogram in 05/2022 showed his RV function was only mildly reduced. As discussed at that time, will plan for a  follow-up echocardiogram in 1 year for reassessment to make sure this has fully normalized.  2. HTN - His blood pressure is well-controlled at 122/66 during today's visit. Continue current medical therapy with Chlorthalidone 12.5 mg daily and Toprol-XL 100 mg daily.  3. History of PE/DVT - He is followed by Hematology and says it was previously recommended to remain on lifelong anticoagulation given his unprovoked event. Continue Eliquis 5 mg twice daily for anticoagulation. CBC in 08/2022 showed his hemoglobin was stable at 13.4 with  platelets at 205 K.   4. Stage 3 CKD - Followed by Dr. Wolfgang Phoenix. Creatinine was stable at 1.88 in 08/2022 which is close to his known baseline.    Signed, Ellsworth Lennox, PA-C

## 2022-11-18 ENCOUNTER — Inpatient Hospital Stay: Attending: Hematology

## 2022-11-18 DIAGNOSIS — R200 Anesthesia of skin: Secondary | ICD-10-CM | POA: Diagnosis not present

## 2022-11-18 DIAGNOSIS — R42 Dizziness and giddiness: Secondary | ICD-10-CM | POA: Diagnosis not present

## 2022-11-18 DIAGNOSIS — Z7901 Long term (current) use of anticoagulants: Secondary | ICD-10-CM | POA: Diagnosis not present

## 2022-11-18 DIAGNOSIS — R600 Localized edema: Secondary | ICD-10-CM | POA: Insufficient documentation

## 2022-11-18 DIAGNOSIS — E669 Obesity, unspecified: Secondary | ICD-10-CM | POA: Insufficient documentation

## 2022-11-18 DIAGNOSIS — R5383 Other fatigue: Secondary | ICD-10-CM | POA: Diagnosis not present

## 2022-11-18 DIAGNOSIS — Z86718 Personal history of other venous thrombosis and embolism: Secondary | ICD-10-CM | POA: Insufficient documentation

## 2022-11-18 DIAGNOSIS — I2609 Other pulmonary embolism with acute cor pulmonale: Secondary | ICD-10-CM

## 2022-11-18 DIAGNOSIS — R7989 Other specified abnormal findings of blood chemistry: Secondary | ICD-10-CM | POA: Diagnosis not present

## 2022-11-18 DIAGNOSIS — Z86711 Personal history of pulmonary embolism: Secondary | ICD-10-CM | POA: Insufficient documentation

## 2022-11-18 LAB — CBC
HCT: 39.5 % (ref 39.0–52.0)
Hemoglobin: 13.4 g/dL (ref 13.0–17.0)
MCH: 30.2 pg (ref 26.0–34.0)
MCHC: 33.9 g/dL (ref 30.0–36.0)
MCV: 89 fL (ref 80.0–100.0)
Platelets: 178 10*3/uL (ref 150–400)
RBC: 4.44 MIL/uL (ref 4.22–5.81)
RDW: 12.9 % (ref 11.5–15.5)
WBC: 6 10*3/uL (ref 4.0–10.5)
nRBC: 0 % (ref 0.0–0.2)

## 2022-11-18 LAB — BASIC METABOLIC PANEL
Anion gap: 7 (ref 5–15)
BUN: 16 mg/dL (ref 6–20)
CO2: 30 mmol/L (ref 22–32)
Calcium: 8.8 mg/dL — ABNORMAL LOW (ref 8.9–10.3)
Chloride: 100 mmol/L (ref 98–111)
Creatinine, Ser: 1.74 mg/dL — ABNORMAL HIGH (ref 0.61–1.24)
GFR, Estimated: 45 mL/min — ABNORMAL LOW (ref >60)
Glucose, Bld: 143 mg/dL — ABNORMAL HIGH (ref 70–99)
Potassium: 3.8 mmol/L (ref 3.5–5.1)
Sodium: 137 mmol/L (ref 135–145)

## 2022-11-18 LAB — D-DIMER, QUANTITATIVE: D-Dimer, Quant: 0.8 ug{FEU}/mL — ABNORMAL HIGH (ref 0.00–0.50)

## 2022-11-18 NOTE — Progress Notes (Unsigned)
[facility] [address], [place] [address]   CLINIC:  Medical Oncology/Hematology  PCP:  Mechele Claude, MD [address] [place] [address] [phone]   REASON FOR VISIT:  Follow-up for unprovoked DVT/PE  CURRENT THERAPY: Eliquis  INTERVAL HISTORY:   [name] 57 y.o. male returns for routine follow-up of his history of unprovoked DVT and PE.  He was last seen by Rojelio Brenner PA-C on 05/13/2022.  At today's visit, he reports feeling fair apart from some recent issues with his right shoulder.  He continues to have mild intermittent lower extremity swelling when he spends too much time in a standing or seated position; left greater than right; resolves within a few days.  He has occasional palpitations.  He denies any chest pain, dyspnea, cough, or hemoptysis.  He reports that he is compliant with Eliquis 5 mg twice daily and denies any major bleeding events.   He has 65% energy and 100% appetite. He endorses that he is maintaining a stable weight.  ASSESSMENT & PLAN:  1.  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  Initial symptoms were left leg pain followed by difficulty breathing and chest tightness and syncopal episode the following day.  Hypotensive in ED with blood pressure 70/50.  He was found to have left leg DVT as well as PE on VQ scan with right heart strain noted on echo. Deemed not to be a candidate for intervention by radiology. He was admitted for IV anticoagulation and transition to Eliquis after 5 days.   - Venous US left leg (03/12/2022): Left femoral popliteal DVT extending into the calf veins - V/Q scan (03/13/2022): Scintigraphic findings compatible with pulmonary embolus (large perfusion defects in right lung with few bilateral small peripheral perfusion defects) - DVT/PE was unprovoked - no preceding trauma, illness, hospitalization, surgery, or  inactivity - No known family history of coagulopathy, but patient's father had blood clot in his 42s and both of his daughters have had miscarriages; patient's mother had miscarriages well. - Coagulopathy workup (04/16/2022): D-dimer elevated at 0.66, but trending downward since >20 at time of diagnosis the month prior Factor V Leiden and prothrombin gene mutation are negative. Anticardiolipin and antibeta 2 glycoprotein antibodies are negative. Initial Lupus anticoagulant was POSITIVE.  Repeat lupus anticoagulant (08/13/2022) was NEGATIVE. - Tolerating Eliquis well without any bleeding events - Most recent labs (11/18/2022): Normal CBC.  Baseline CKD stage IIIb.  D-dimer mildly elevated at 0.80. - Continues to have some trace lower extremity edema and bilateral calf soreness, but this is improving.  Intermittent sharp nonpleuritic chest pain improving after being started on Eliquis. - PLAN: Mildly elevated D-dimer is noted, but in the absence of any symptoms there is no current clinical concern for DVT/PE. - Labs (CBC/D, BMP, D-dimer) followed by OFFICE visit in 1 year    2.  Other history - PMH: Hypertension, gout, CKD stage IIIb, obesity, and GERD.  - SOCIAL: He is retired from 35+ year career in [employer]. He lives at home with his wife. He drinks occasional alcohol. He denies any tobacco or illicit drug use.  - FAMILY: No known family history of coagulopathy, but reports that his father had blood clots in his 69s. One of his daughters miscarried x 2, another daughter had miscarriage x 1.  Patient's mother had miscarriage x 1.  Mother had (questionable) history of colon  cancer versus precancerous colon polyps.  He has a maternal uncle with unspecified type of cancer.   PLAN SUMMARY >> Labs in 1 year = CBC/D, BMP, D-dimer >> OFFICE visit in 1 year     REVIEW OF SYSTEMS:   Review of Systems  Constitutional:  Positive for fatigue. Negative for appetite change, chills, diaphoresis, fever and  unexpected weight change.  HENT:   Negative for lump/mass and nosebleeds.   Eyes:  Negative for eye problems.  Respiratory:  Negative for cough, hemoptysis and shortness of breath.   Cardiovascular:  Positive for leg swelling. Negative for chest pain and palpitations.  Gastrointestinal:  Negative for abdominal pain, blood in stool, constipation, diarrhea, nausea and vomiting.  Genitourinary:  Negative for hematuria.   Musculoskeletal:  Positive for arthralgias. Negative for back pain.  Skin: Negative.   Neurological:  Positive for dizziness and numbness. Negative for headaches and light-headedness.  Hematological:  Does not bruise/bleed easily.     PHYSICAL EXAM:  ECOG PERFORMANCE STATUS: 1 - Symptomatic but completely ambulatory  Vitals:   11/19/22 0808  BP: 119/77  Pulse: 64  Resp: 18  Temp: 98.7 F (37.1 C)  SpO2: 98%   Filed Weights   11/19/22 0808  Weight: 259 lb 4.2 oz (117.6 kg)   Physical Exam Constitutional:      Appearance: Normal appearance. He is obese.  Cardiovascular:     Heart sounds: Normal heart sounds.  Pulmonary:     Breath sounds: Normal breath sounds.  Musculoskeletal:     Right lower leg: No edema.     Left lower leg: Edema (trace) present.  Neurological:     General: No focal deficit present.     Mental Status: Mental status is at baseline.  Psychiatric:        Behavior: Behavior normal. Behavior is cooperative.    PAST MEDICAL/SURGICAL HISTORY:  Past Medical History:  Diagnosis Date   DVT (deep venous thrombosis) (HCC)    Gout    Hypertension    Medical history non-contributory    Pulmonary embolism (HCC)    Wears glasses    Past Surgical History:  Procedure Laterality Date   COLONOSCOPY WITH PROPOFOL N/A 08/02/2019   Procedure: COLONOSCOPY WITH PROPOFOL;  Surgeon: Corbin Ade, MD;  [facility];  Service: Endoscopy;  Laterality: N/A;  9:15am   KNEE ARTHROSCOPY WITH PATELLAR TENDON REPAIR Left 09/03/2012   Procedure:  LEFT KNEE ARTHROSCOPY, LOOSE BODY EXCISION, PATELLAR TENDON REPAIR, EXCISION TIBIAL TUBERCLE SPUR, CHONDROPLASTY PATELLA, EXCISION PLICA;  Surgeon: Loreta Ave, MD;  Location: [facility];  Service: Orthopedics;  Laterality: Left;   WISDOM TOOTH EXTRACTION      SOCIAL HISTORY:  Social History   Socioeconomic History   Marital status: Married    Spouse name: Not on file   Number of children: Not on file   Years of education: Not on file   Highest education level: Not on file  Occupational History   Not on file  Tobacco Use   Smoking status: Never   Smokeless tobacco: Never  Vaping Use   Vaping status: Never Used  Substance and Sexual Activity   Alcohol use: Yes    Comment: rare   Drug use: No   Sexual activity: Not on file  Other Topics Concern   Not on file  Social History Narrative   Not on file   Social Determinants of Health   Financial Resource Strain: Not on file  Food Insecurity: No  Food Insecurity (04/16/2022)   Hunger Vital Sign    Worried About Running Out of Food in the Last Year: Never true    Ran Out of Food in the Last Year: Never true  Transportation Needs: No Transportation Needs (04/16/2022)   PRAPARE - Administrator, Civil Service (Medical): No    Lack of Transportation (Non-Medical): No  Physical Activity: Not on file  Stress: Not on file  Social Connections: Unknown (07/24/2021)   [facility]   Social Network    Social Network: Not on file  Intimate Partner Violence: Not At Risk (04/16/2022)   Humiliation, Afraid, Rape, and Kick questionnaire    Fear of Current or Ex-Partner: No    Emotionally Abused: No    Physically Abused: No    Sexually Abused: No    FAMILY HISTORY:  Family History  Problem Relation Age of Onset   Colon cancer Mother        Precancerous colon polyps; unsure of actual CRC?   Diabetes Father    Hypertension Father    Heart attack Father     CURRENT MEDICATIONS:   Outpatient Encounter Medications as of 11/19/2022  Medication Sig   acetaminophen (TYLENOL) 500 MG tablet Take 500 mg by mouth every 6 (six) hours as needed for mild pain or headache.   allopurinol (ZYLOPRIM) 300 MG tablet Take 1 tablet (300 mg total) by mouth at bedtime.   apixaban (ELIQUIS) 5 MG TABS tablet Take 5 mg by mouth 2 (two) times daily.   chlorthalidone (HYGROTON) 25 MG tablet Take 0.5 tablets (12.5 mg total) by mouth daily. Home medication until follow-up with PCP.   Cholecalciferol (VITAMIN D-3 PO) Take 1 tablet by mouth daily.   lidocaine (LIDODERM) 5 % Place 1 patch onto the skin daily.   metoprolol succinate (TOPROL-XL) 100 MG 24 hr tablet TAKE 1 TABLET BY MOUTH EVERY DAY WITH OR IMMEDIATELY FOLLOWING A MEAL   tiZANidine (ZANAFLEX) 4 MG tablet Take 4 mg by mouth every 8 (eight) hours as needed for muscle spasms.   traZODone (DESYREL) 150 MG tablet Use from 1/3 to 1 tablet nightly as needed for sleep.   [DISCONTINUED] pantoprazole (PROTONIX) 40 MG tablet Take 1 tablet (40 mg total) by mouth daily. (Patient not taking: Reported on 10/10/2022)   No facility-administered encounter medications on file as of 11/19/2022.    ALLERGIES:  Allergies  Allergen Reactions   Sulfa Antibiotics     Unknown-was told that    LABORATORY DATA:  I have reviewed the labs as listed.  CBC    Component Value Date/Time   WBC 6.0 11/18/2022 1002   RBC 4.44 11/18/2022 1002   HGB 13.4 11/18/2022 1002   HGB 13.8 03/20/2022 1433   HCT 39.5 11/18/2022 1002   HCT 41.2 03/20/2022 1433   PLT 178 11/18/2022 1002   PLT 270 03/20/2022 1433   MCV 89.0 11/18/2022 1002   MCV 86 03/20/2022 1433   MCH 30.2 11/18/2022 1002   MCHC 33.9 11/18/2022 1002   RDW 12.9 11/18/2022 1002   RDW 12.5 03/20/2022 1433   LYMPHSABS 0.9 08/13/2022 1052   LYMPHSABS 1.0 03/20/2022 1433   MONOABS 0.7 08/13/2022 1052   EOSABS 0.0 08/13/2022 1052   EOSABS 0.0 03/20/2022 1433   BASOSABS 0.0 08/13/2022 1052   BASOSABS 0.0  03/20/2022 1433      Latest Ref Rng & Units 11/18/2022   10:02 AM 08/13/2022   10:52 AM 05/15/2022  9:31 AM  CMP  Glucose 70 - 99 mg/dL 161  096  045   BUN 6 - 20 mg/dL 16  25  22    Creatinine 0.61 - 1.24 mg/dL 4.09  8.11  9.14   Sodium 135 - 145 mmol/L 137  134  141   Potassium 3.5 - 5.1 mmol/L 3.8  3.5  3.5   Chloride 98 - 111 mmol/L 100  101  103   CO2 22 - 32 mmol/L 30  25  23    Calcium 8.9 - 10.3 mg/dL 8.8  9.1  9.5   Total Protein 6.5 - 8.1 g/dL  7.2  6.8   Total Bilirubin 0.3 - 1.2 mg/dL  0.8  0.4   Alkaline Phos 38 - 126 U/L  66  66   AST 15 - 41 U/L  26  21   ALT 0 - 44 U/L  29  21     DIAGNOSTIC IMAGING:  I have independently reviewed the relevant imaging and discussed with the patient.   WRAP UP:  All questions were answered. The patient knows to call the clinic with any problems, questions or concerns.  Medical decision making: Low  Time spent on visit: I spent 15 minutes counseling the patient face to face. The total time spent in the appointment was 22 minutes and more than 50% was on counseling.  Carnella Guadalajara, PA-C  11/19/22 8:37 AM

## 2022-11-19 ENCOUNTER — Inpatient Hospital Stay: Admitting: Physician Assistant

## 2022-11-19 VITALS — BP 119/77 | HR 64 | Temp 98.7°F | Resp 18 | Wt 259.3 lb

## 2022-11-19 DIAGNOSIS — Z7901 Long term (current) use of anticoagulants: Secondary | ICD-10-CM | POA: Diagnosis not present

## 2022-11-19 DIAGNOSIS — I82412 Acute embolism and thrombosis of left femoral vein: Secondary | ICD-10-CM | POA: Diagnosis not present

## 2022-11-19 DIAGNOSIS — Z86711 Personal history of pulmonary embolism: Secondary | ICD-10-CM | POA: Diagnosis not present

## 2022-11-19 DIAGNOSIS — I2609 Other pulmonary embolism with acute cor pulmonale: Secondary | ICD-10-CM | POA: Diagnosis not present

## 2022-11-19 NOTE — Patient Instructions (Signed)
[facility] **VISIT SUMMARY & IMPORTANT INSTRUCTIONS **   You were seen today by Rojelio Brenner PA-C for your recent diagnosis of blood clots in your left leg and lungs.   Since your blood clots were "unprovoked" you will need to be on lifelong blood thinners to prevent new episodes of blood clots in the future. Do NOT stop taking your blood thinners or change your dose without speaking with [place] first. If cost is a barrier to obtaining your blood thinner medications, please call our office to discuss alternative medications or financial assistance resources. See the attached handout for important information regarding bleeding precautions while taking a blood thinner, and for information regarding symptoms of recurrent blood clots that would need immediate medical attention.  MEDICATIONS: Continue Eliquis 5 mg twice daily.  FOLLOW-UP APPOINTMENT: Labs and office visit in 1 year  ** Thank you for trusting me with your healthcare!  I strive to provide all of my patients with quality care at each visit.  If you receive a survey for this visit, I would be so grateful to you for taking the time to provide feedback.  Thank you in advance!  ~ Nikolas Casher                   Dr. Doreatha Massed   &   Rojelio Brenner, PA-C   - - - - - - - - - - - - - - - - - -    Thank you for choosing  [facility] at [facility] to provide your oncology and hematology care.  To afford each patient quality time with our provider, please arrive at least 15 minutes before your scheduled appointment time.   If you have a lab appointment with the Cancer Center please come in thru the Main Entrance and check in at the main information desk.  You need to re-schedule your appointment should you arrive 10 or more minutes late.  We strive to give you quality time with our providers, and arriving late affects you and other patients whose appointments are after yours.   Also, if you no show three or more times for appointments you may be dismissed from the clinic at the providers discretion.     Again, thank you for choosing [facility].  Our hope is that these requests will decrease the amount of time that you wait before being seen by our physicians.       _____________________________________________________________  Should you have questions after your visit to [facility], please contact our office at [phone] and follow the prompts.  Our office hours are 8:00 a.m. and 4:30 p.m. Monday - Friday.  Please note that voicemails left after 4:00 p.m. may not be returned until the following business day.  We are closed weekends and major holidays.  You do have access to a nurse 24-7, just call the main number to the clinic [phone] and do not press any options, hold on the line and a nurse will answer the phone.    For prescription refill requests, have your pharmacy contact our office and allow 72 hours.

## 2022-11-20 ENCOUNTER — Ambulatory Visit: Admitting: Family Medicine

## 2022-11-20 ENCOUNTER — Encounter: Payer: Self-pay | Admitting: Family Medicine

## 2022-11-20 VITALS — BP 116/66 | HR 60 | Temp 97.3°F | Ht 73.0 in | Wt 261.8 lb

## 2022-11-20 DIAGNOSIS — E785 Hyperlipidemia, unspecified: Secondary | ICD-10-CM | POA: Diagnosis not present

## 2022-11-20 DIAGNOSIS — M1A0790 Idiopathic chronic gout, unspecified ankle and foot, without tophus (tophi): Secondary | ICD-10-CM

## 2022-11-20 DIAGNOSIS — E559 Vitamin D deficiency, unspecified: Secondary | ICD-10-CM

## 2022-11-20 DIAGNOSIS — I10 Essential (primary) hypertension: Secondary | ICD-10-CM

## 2022-11-20 DIAGNOSIS — K2100 Gastro-esophageal reflux disease with esophagitis, without bleeding: Secondary | ICD-10-CM

## 2022-11-20 DIAGNOSIS — F5101 Primary insomnia: Secondary | ICD-10-CM

## 2022-11-20 DIAGNOSIS — Z125 Encounter for screening for malignant neoplasm of prostate: Secondary | ICD-10-CM

## 2022-11-20 DIAGNOSIS — D6862 Lupus anticoagulant syndrome: Secondary | ICD-10-CM

## 2022-11-20 DIAGNOSIS — R7309 Other abnormal glucose: Secondary | ICD-10-CM

## 2022-11-20 DIAGNOSIS — N1832 Chronic kidney disease, stage 3b: Secondary | ICD-10-CM

## 2022-11-20 DIAGNOSIS — R739 Hyperglycemia, unspecified: Secondary | ICD-10-CM

## 2022-11-20 LAB — BAYER DCA HB A1C WAIVED: HB A1C (BAYER DCA - WAIVED): 5.6 % (ref 4.8–5.6)

## 2022-11-20 MED ORDER — CHLORTHALIDONE 25 MG PO TABS: 12.5000 mg | ORAL_TABLET | Freq: Every day | ORAL | 3 refills | Status: DC

## 2022-11-20 MED ORDER — NEBIVOLOL HCL 20 MG PO TABS: 20.0000 mg | ORAL_TABLET | Freq: Every day | ORAL | 3 refills | Status: DC

## 2022-11-20 NOTE — Progress Notes (Signed)
Subjective:  Patient ID: [name] J Guinea-Bissau Jr., male    DOB: [birth year]  Age: 57 y.o. MRN: [id number]  CC: Medical Management of Chronic Issues   HPI [name] J Guinea-Bissau Jr. presents for  presents for  follow-up of hypertension. Patient has no history of headache chest pain or shortness of breath or recent cough. Patient also denies symptoms of TIA such as focal numbness or weakness. Patient denies side effects from medication. States taking it regularly.So.me problem with E.D. due to meds. Wants to try bystolic  Patient in for follow-up of GERD. Currently asymptomatic taking  PPI daily. There is no chest pain or heartburn. No hematemesis and no melena. No dysphagia or choking. Onset is remote. Progression is stable. Complicating factors, none.  Shoulder pain under tx at [facility]. Had MRI. Trazodone needed to allow for sleep.  Elevated glucose on recent blood work. Will need A1c.  Chronic gout. Due uric acid level.        11/20/2022    9:09 AM 11/20/2022    9:03 AM 05/15/2022    8:45 AM  Depression screen PHQ 2/9  Decreased Interest 0 0 0  Down, Depressed, Hopeless 0 0 0  PHQ - 2 Score 0 0 0    History [name] [name] has a past medical history of DVT (deep venous thrombosis) (HCC), Gout, Hypertension, Medical history non-contributory, Pulmonary embolism (HCC), and Wears glasses.   He has a past surgical history that includes Wisdom tooth extraction; Knee arthroscopy with patellar tendon repair (Left, 09/03/2012); and Colonoscopy with propofol (N/A, 08/02/2019).   His family history includes Colon cancer in his mother; Diabetes in his father; Heart attack in his father; Hypertension in his father.He reports that he has never smoked. He has never used smokeless tobacco. He reports current alcohol use. He reports that he does not use drugs.    ROS Review of Systems  Constitutional:  Negative for fever.  Respiratory:  Negative for shortness of breath.   Cardiovascular:  Negative for chest pain.   Musculoskeletal:  Negative for arthralgias.  Skin:  Negative for rash.    Objective:  BP 116/66   Pulse 60   Temp (!) 97.3 F (36.3 C)   Ht 6\' 1"  (1.854 m)   Wt 261 lb 12.8 oz (118.8 kg)   SpO2 96%   BMI 34.54 kg/m   BP Readings from Last 3 Encounters:  11/20/22 116/66  11/19/22 119/77  10/10/22 122/66    Wt Readings from Last 3 Encounters:  11/20/22 261 lb 12.8 oz (118.8 kg)  11/19/22 259 lb 4.2 oz (117.6 kg)  10/10/22 257 lb (116.6 kg)     Physical Exam Vitals reviewed.  Constitutional:      Appearance: He is well-developed.  HENT:     Head: Normocephalic and atraumatic.     Right Ear: External ear normal.     Left Ear: External ear normal.     Mouth/Throat:     Pharynx: No oropharyngeal exudate or posterior oropharyngeal erythema.  Eyes:     Pupils: Pupils are equal, round, and reactive to light.  Cardiovascular:     Rate and Rhythm: Normal rate and regular rhythm.     Heart sounds: No murmur heard. Pulmonary:     Effort: No respiratory distress.     Breath sounds: Normal breath sounds.  Musculoskeletal:        General: Tenderness (right shoulder for abduction and  rotation) present.     Cervical back: Normal range of motion and neck  supple.  Neurological:     Mental Status: He is alert and oriented to person, place, and time.       Assessment & Plan:   [name] [name] was seen today for medical management of chronic issues.  Diagnoses and all orders for this visit:  Primary hypertension -     CMP14+EGFR  Hyperlipidemia, unspecified hyperlipidemia type -     Lipid panel  Vitamin D deficiency -     VITAMIN D 25 Hydroxy (Vit-D Deficiency, Fractures)  Screening for prostate cancer -     PSA, total and free  Primary insomnia  Lupus anticoagulant disorder (HCC)  Stage 3b chronic kidney disease (HCC)  Chronic gout of ankle, unspecified cause, unspecified laterality  Gastroesophageal reflux disease with esophagitis without hemorrhage  Elevated  random blood glucose level -     Bayer DCA Hb A1c Waived  Other orders -     chlorthalidone (HYGROTON) 25 MG tablet; Take 0.5 tablets (12.5 mg total) by mouth daily. Home medication until follow-up with PCP. -     Nebivolol HCl 20 MG TABS; Take 1 tablet (20 mg total) by mouth daily.       I have discontinued [name] J. Guinea-Bissau Jr.'s metoprolol succinate. I am also having him start on Nebivolol HCl. Additionally, I am having him maintain his acetaminophen, tiZANidine, Cholecalciferol (VITAMIN D-3 PO), lidocaine, apixaban, allopurinol, traZODone, and chlorthalidone.  Allergies as of 11/20/2022       Reactions   Sulfa Antibiotics    Unknown-was told that        Medication List        Accurate as of November 20, 2022  9:40 PM. If you have any questions, ask your nurse or doctor.          STOP taking these medications    metoprolol succinate 100 MG 24 hr tablet Commonly known as: TOPROL-XL Stopped by: Keith Felten       TAKE these medications    acetaminophen 500 MG tablet Commonly known as: TYLENOL Take 500 mg by mouth every 6 (six) hours as needed for mild pain or headache.   allopurinol 300 MG tablet Commonly known as: ZYLOPRIM Take 1 tablet (300 mg total) by mouth at bedtime.   apixaban 5 MG Tabs tablet Commonly known as: ELIQUIS Take 5 mg by mouth 2 (two) times daily.   chlorthalidone 25 MG tablet Commonly known as: HYGROTON Take 0.5 tablets (12.5 mg total) by mouth daily. Home medication until follow-up with PCP.   lidocaine 5 % Commonly known as: LIDODERM Place 1 patch onto the skin daily.   Nebivolol HCl 20 MG Tabs Take 1 tablet (20 mg total) by mouth daily. Started by: Larie Mathes   tiZANidine 4 MG tablet Commonly known as: ZANAFLEX Take 4 mg by mouth every 8 (eight) hours as needed for muscle spasms.   traZODone 150 MG tablet Commonly known as: DESYREL Use from 1/3 to 1 tablet nightly as needed for sleep.   VITAMIN D-3 PO Take 1 tablet  by mouth daily.         Follow-up: Return in about 6 months (around 05/20/2023).  Mechele Claude, M.D.

## 2022-11-21 LAB — LIPID PANEL
Chol/HDL Ratio: 2.9 ratio (ref 0.0–5.0)
Cholesterol, Total: 141 mg/dL (ref 100–199)
HDL: 48 mg/dL (ref >39)
LDL Chol Calc (NIH): 80 mg/dL (ref 0–99)
Triglycerides: 62 mg/dL (ref 0–149)
VLDL Cholesterol Cal: 13 mg/dL (ref 5–40)

## 2022-11-21 LAB — CMP14+EGFR
ALT: 20 IU/L (ref 0–44)
AST: 19 IU/L (ref 0–40)
Albumin: 4.1 g/dL (ref 3.8–4.9)
Alkaline Phosphatase: 56 IU/L (ref 44–121)
BUN/Creatinine Ratio: 10 (ref 9–20)
BUN: 18 mg/dL (ref 6–24)
Bilirubin Total: 0.4 mg/dL (ref 0.0–1.2)
CO2: 22 mmol/L (ref 20–29)
Calcium: 9.2 mg/dL (ref 8.7–10.2)
Chloride: 103 mmol/L (ref 96–106)
Creatinine, Ser: 1.86 mg/dL — ABNORMAL HIGH (ref 0.76–1.27)
Globulin, Total: 2.3 g/dL (ref 1.5–4.5)
Glucose: 126 mg/dL — ABNORMAL HIGH (ref 70–99)
Potassium: 3.7 mmol/L (ref 3.5–5.2)
Sodium: 139 mmol/L (ref 134–144)
Total Protein: 6.4 g/dL (ref 6.0–8.5)
eGFR: 41 mL/min/{1.73_m2} — ABNORMAL LOW (ref >59)

## 2022-11-21 LAB — VITAMIN D 25 HYDROXY (VIT D DEFICIENCY, FRACTURES): Vit D, 25-Hydroxy: 40.8 ng/mL (ref 30.0–100.0)

## 2022-11-21 LAB — PSA, TOTAL AND FREE
PSA, Free Pct: 32.3 %
PSA, Free: 0.42 ng/mL
Prostate Specific Ag, Serum: 1.3 ng/mL (ref 0.0–4.0)

## 2022-11-21 MED ORDER — METOPROLOL SUCCINATE ER 100 MG PO TB24: ORAL_TABLET | ORAL | 3 refills | Status: DC

## 2022-11-21 NOTE — Addendum Note (Signed)
Addended by: Mechele Claude on: 11/21/2022 02:36 PM   Modules accepted: Orders

## 2022-11-27 ENCOUNTER — Telehealth: Payer: Self-pay

## 2022-11-27 NOTE — Telephone Encounter (Signed)
Pharmacy Patient Advocate Encounter  Received notification from [facility] that Prior Authorization for Nebivolol HCl 20MG  tablets has been APPROVED from 10/28/2022 to 03/10/2098   PA #/Case ID/Reference #: [id number]

## 2022-11-27 NOTE — Telephone Encounter (Signed)
[name] [name] (KeyJerilynn Mages) PA Case ID #: [id number] Rx #: [id number] Need Help? Call us at [phone] Status sent iconSent to Plan today Drug Nebivolol HCl 20MG  tablets ePA cloud logo Form [employer] Electronic PA Form [phone] NCPDP) Original Claim Info 75 CALL HELP DESK*WAG*Non-Form Product- Potential Alternatives are: [id number] - ATENOLOL, [id number] Cammie Sickle, [id number] Jennette Kettle, [id number] - METOPROLOL TARTRATE, [id number] - PROPRANOLOL HCL

## 2023-02-20 ENCOUNTER — Other Ambulatory Visit: Payer: Self-pay | Admitting: Family Medicine

## 2023-02-20 DIAGNOSIS — M109 Gout, unspecified: Secondary | ICD-10-CM

## 2023-04-08 ENCOUNTER — Other Ambulatory Visit (HOSPITAL_COMMUNITY): Payer: Self-pay | Admitting: Nurse Practitioner

## 2023-04-08 DIAGNOSIS — N1832 Chronic kidney disease, stage 3b: Secondary | ICD-10-CM

## 2023-04-15 ENCOUNTER — Ambulatory Visit (HOSPITAL_COMMUNITY)
Admission: RE | Admit: 2023-04-15 | Discharge: 2023-04-15 | Disposition: A | Discharge status: Home / Self Care | Payer: Self-pay | Source: Ambulatory Visit | Attending: Nurse Practitioner | Admitting: Nurse Practitioner

## 2023-04-15 DIAGNOSIS — N1832 Chronic kidney disease, stage 3b: Secondary | ICD-10-CM | POA: Insufficient documentation

## 2023-05-12 ENCOUNTER — Other Ambulatory Visit: Payer: Self-pay | Admitting: Family Medicine

## 2023-05-20 ENCOUNTER — Ambulatory Visit (INDEPENDENT_AMBULATORY_CARE_PROVIDER_SITE_OTHER): Admitting: Family Medicine

## 2023-05-20 ENCOUNTER — Encounter: Payer: Self-pay | Admitting: Family Medicine

## 2023-05-20 VITALS — BP 109/61 | HR 64 | Temp 98.1°F | Ht 73.0 in | Wt 260.0 lb

## 2023-05-20 DIAGNOSIS — M109 Gout, unspecified: Secondary | ICD-10-CM | POA: Diagnosis not present

## 2023-05-20 DIAGNOSIS — N1832 Chronic kidney disease, stage 3b: Secondary | ICD-10-CM | POA: Diagnosis not present

## 2023-05-20 DIAGNOSIS — I10 Essential (primary) hypertension: Secondary | ICD-10-CM | POA: Diagnosis not present

## 2023-05-20 MED ORDER — TRAZODONE HCL 150 MG PO TABS: ORAL_TABLET | ORAL | 3 refills | Status: AC

## 2023-05-20 MED ORDER — ALLOPURINOL 300 MG PO TABS: 300.0000 mg | ORAL_TABLET | Freq: Every day | ORAL | 0 refills | Status: DC

## 2023-05-20 NOTE — Progress Notes (Signed)
 Subjective:  Patient ID: [name] J Guinea-Bissau Jr., male    DOB: 1965/11/13  Age: 58 y.o. MRN: [id number]  CC: Medical Management of Chronic Issues   HPI [name] J Guinea-Bissau Jr. presents for  follow-up of hypertension. Patient has no history of headache chest pain or shortness of breath or recent cough. Patient also denies symptoms of TIA such as focal numbness or weakness. Patient denies side effects from medication. States taking it regularly.  Rotator cuff  rihgt shouldesurgery through [place] in January. Now getting PT. He is working on ROM and strength.   Not sleeping well. Off trazodone due to periop use of oxycodone.    History [name] [name] has a past medical history of DVT (deep venous thrombosis) (HCC), Gout, Hypertension, Medical history non-contributory, Pulmonary embolism (HCC), and Wears glasses.   He has a past surgical history that includes Wisdom tooth extraction; Knee arthroscopy with patellar tendon repair (Left, 09/03/2012); and Colonoscopy with propofol (N/A, 08/02/2019).   His family history includes Colon cancer in his mother; Diabetes in his father; Heart attack in his father; Hypertension in his father.He reports that he has never smoked. He has never used smokeless tobacco. He reports current alcohol use. He reports that he does not use drugs.  Current Outpatient Medications on File Prior to Visit  Medication Sig Dispense Refill   acetaminophen (TYLENOL) 500 MG tablet Take 500 mg by mouth every 6 (six) hours as needed for mild pain or headache.     apixaban (ELIQUIS) 5 MG TABS tablet Take 5 mg by mouth 2 (two) times daily.     chlorthalidone (HYGROTON) 25 MG tablet TAKE 1/2 TABLET BY MOUTH EVERY DAY IN THE MORNING WITH FOOD 45 tablet 0   Cholecalciferol (VITAMIN D-3 PO) Take 1 tablet by mouth daily.     metoprolol succinate (TOPROL-XL) 100 MG 24 hr tablet TAKE 1 TABLET BY MOUTH EVERY DAY WITH OR IMMEDIATELY FOLLOWING A MEAL 90 tablet 3   tiZANidine (ZANAFLEX) 4 MG tablet Take 4 mg by mouth  every 8 (eight) hours as needed for muscle spasms.     No current facility-administered medications on file prior to visit.    ROS Review of Systems  Constitutional:  Negative for fever.  Respiratory:  Negative for shortness of breath.   Cardiovascular:  Negative for chest pain.  Musculoskeletal:  Negative for arthralgias.  Skin:  Negative for rash.    Objective:  BP 109/61   Pulse 64   Temp 98.1 F (36.7 C)   Ht 6\' 1"  (1.854 m)   Wt 260 lb (117.9 kg)   SpO2 97%   BMI 34.30 kg/m   BP Readings from Last 3 Encounters:  05/20/23 109/61  11/20/22 116/66  11/19/22 119/77    Wt Readings from Last 3 Encounters:  05/20/23 260 lb (117.9 kg)  11/20/22 261 lb 12.8 oz (118.8 kg)  11/19/22 259 lb 4.2 oz (117.6 kg)     Physical Exam Vitals reviewed.  Constitutional:      Appearance: He is well-developed.  HENT:     Head: Normocephalic and atraumatic.     Right Ear: External ear normal.     Left Ear: External ear normal.     Mouth/Throat:     Pharynx: No oropharyngeal exudate or posterior oropharyngeal erythema.  Eyes:     Pupils: Pupils are equal, round, and reactive to light.  Cardiovascular:     Rate and Rhythm: Normal rate and regular rhythm.     Heart sounds: No murmur  heard. Pulmonary:     Effort: No respiratory distress.     Breath sounds: Normal breath sounds.  Musculoskeletal:     Cervical back: Normal range of motion and neck supple.  Neurological:     Mental Status: He is alert and oriented to person, place, and time.       Assessment & Plan:   [name] [name] was seen today for medical management of chronic issues.  Diagnoses and all orders for this visit:  Stage 3b chronic kidney disease (HCC) -     CMP14+EGFR -     Uric acid  Gout of foot, unspecified cause, unspecified chronicity, unspecified laterality -     allopurinol (ZYLOPRIM) 300 MG tablet; Take 1 tablet (300 mg total) by mouth daily. -     Uric acid  Primary hypertension -     CBC with  Differential/Platelet -     CMP14+EGFR  Other orders -     traZODone (DESYREL) 150 MG tablet; Use from 1/3 to 1 tablet nightly as needed for sleep.   Allergies as of 05/20/2023       Reactions   Sulfa Antibiotics    Unknown-was told that        Medication List        Accurate as of May 20, 2023 10:30 AM. If you have any questions, ask your nurse or doctor.          acetaminophen 500 MG tablet Commonly known as: TYLENOL Take 500 mg by mouth every 6 (six) hours as needed for mild pain or headache.   allopurinol 300 MG tablet Commonly known as: ZYLOPRIM Take 1 tablet (300 mg total) by mouth daily. What changed: See the new instructions. Changed by: Broadus John Krayton Wortley   apixaban 5 MG Tabs tablet Commonly known as: ELIQUIS Take 5 mg by mouth 2 (two) times daily.   chlorthalidone 25 MG tablet Commonly known as: HYGROTON TAKE 1/2 TABLET BY MOUTH EVERY DAY IN THE MORNING WITH FOOD   metoprolol succinate 100 MG 24 hr tablet Commonly known as: TOPROL-XL TAKE 1 TABLET BY MOUTH EVERY DAY WITH OR IMMEDIATELY FOLLOWING A MEAL   tiZANidine 4 MG tablet Commonly known as: ZANAFLEX Take 4 mg by mouth every 8 (eight) hours as needed for muscle spasms.   traZODone 150 MG tablet Commonly known as: DESYREL Use from 1/3 to 1 tablet nightly as needed for sleep.   VITAMIN D-3 PO Take 1 tablet by mouth daily.        Meds ordered this encounter  Medications   traZODone (DESYREL) 150 MG tablet    Sig: Use from 1/3 to 1 tablet nightly as needed for sleep.    Dispense:  90 tablet    Refill:  3   allopurinol (ZYLOPRIM) 300 MG tablet    Sig: Take 1 tablet (300 mg total) by mouth daily.    Dispense:  90 tablet    Refill:  0    Okay to resume trazodone.  Follow-up: Return in about 6 months (around 11/20/2023) for [facility] physical.  Mechele Claude, M.D.

## 2023-05-21 LAB — CBC WITH DIFFERENTIAL/PLATELET
Basophils Absolute: 0 10*3/uL (ref 0.0–0.2)
Basos: 0 %
EOS (ABSOLUTE): 0.1 10*3/uL (ref 0.0–0.4)
Eos: 2 %
Hematocrit: 39.7 % (ref 37.5–51.0)
Hemoglobin: 13.5 g/dL (ref 13.0–17.7)
Immature Grans (Abs): 0 10*3/uL (ref 0.0–0.1)
Immature Granulocytes: 0 %
Lymphocytes Absolute: 1.1 10*3/uL (ref 0.7–3.1)
Lymphs: 18 %
MCH: 30.3 pg (ref 26.6–33.0)
MCHC: 34 g/dL (ref 31.5–35.7)
MCV: 89 fL (ref 79–97)
Monocytes Absolute: 0.5 10*3/uL (ref 0.1–0.9)
Monocytes: 8 %
Neutrophils Absolute: 4.4 10*3/uL (ref 1.4–7.0)
Neutrophils: 72 %
Platelets: 192 10*3/uL (ref 150–450)
RBC: 4.46 x10E6/uL (ref 4.14–5.80)
RDW: 12.9 % (ref 11.6–15.4)
WBC: 6.2 10*3/uL (ref 3.4–10.8)

## 2023-05-21 LAB — CMP14+EGFR
ALT: 20 IU/L (ref 0–44)
AST: 22 IU/L (ref 0–40)
Albumin: 4.3 g/dL (ref 3.8–4.9)
Alkaline Phosphatase: 63 IU/L (ref 44–121)
BUN/Creatinine Ratio: 9 (ref 9–20)
BUN: 17 mg/dL (ref 6–24)
Bilirubin Total: 0.5 mg/dL (ref 0.0–1.2)
CO2: 24 mmol/L (ref 20–29)
Calcium: 9.4 mg/dL (ref 8.7–10.2)
Chloride: 103 mmol/L (ref 96–106)
Creatinine, Ser: 1.87 mg/dL — ABNORMAL HIGH (ref 0.76–1.27)
Globulin, Total: 2.4 g/dL (ref 1.5–4.5)
Glucose: 115 mg/dL — ABNORMAL HIGH (ref 70–99)
Potassium: 3.8 mmol/L (ref 3.5–5.2)
Sodium: 141 mmol/L (ref 134–144)
Total Protein: 6.7 g/dL (ref 6.0–8.5)
eGFR: 41 mL/min/{1.73_m2} — ABNORMAL LOW (ref >59)

## 2023-05-21 LAB — URIC ACID: Uric Acid: 5.2 mg/dL (ref 3.8–8.4)

## 2023-05-24 ENCOUNTER — Encounter: Payer: Self-pay | Admitting: Family Medicine

## 2023-07-29 ENCOUNTER — Encounter: Payer: Self-pay | Admitting: Family Medicine

## 2023-07-29 ENCOUNTER — Ambulatory Visit (INDEPENDENT_AMBULATORY_CARE_PROVIDER_SITE_OTHER): Admitting: Family Medicine

## 2023-07-29 DIAGNOSIS — M109 Gout, unspecified: Secondary | ICD-10-CM | POA: Diagnosis not present

## 2023-07-29 DIAGNOSIS — Z0001 Encounter for general adult medical examination with abnormal findings: Secondary | ICD-10-CM | POA: Diagnosis not present

## 2023-07-29 MED ORDER — ALLOPURINOL 300 MG PO TABS: 300.0000 mg | ORAL_TABLET | Freq: Every day | ORAL | 0 refills | Status: AC

## 2023-07-29 MED ORDER — CHLORTHALIDONE 25 MG PO TABS: 25.0000 mg | ORAL_TABLET | Freq: Every day | ORAL | 0 refills | Status: AC

## 2023-07-29 NOTE — Progress Notes (Signed)
 Subjective:   [name] J Guinea-Bissau Jr. is a 58 y.o. male who presents for an Initial Medicare Annual Wellness Visit.  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   HENT: Negative.    Eyes:  Negative for visual disturbance.  Respiratory:  Negative for cough and shortness of breath.   Cardiovascular:  Negative for chest pain and leg swelling.  Gastrointestinal:  Negative for abdominal pain, diarrhea, nausea and vomiting.  Genitourinary:  Negative for difficulty urinating.  Musculoskeletal:  Positive for arthralgias (knees). Negative for myalgias.  Skin:  Negative for rash.  Neurological:  Negative for headaches.  Psychiatric/Behavioral:  Negative for sleep disturbance.        Current Medications (verified) Outpatient Encounter Medications as of 07/29/2023  Medication Sig   acetaminophen  (TYLENOL ) 500 MG tablet Take 500 mg by mouth every 6 (six) hours as needed for mild pain or headache.   apixaban  (ELIQUIS ) 5 MG TABS tablet Take 5 mg by mouth 2 (two) times daily.   Cholecalciferol (VITAMIN D -3 PO) Take 1 tablet by mouth daily.   metoprolol  succinate (TOPROL -XL) 100 MG 24 hr tablet TAKE 1 TABLET BY MOUTH EVERY DAY WITH OR IMMEDIATELY FOLLOWING A MEAL   tiZANidine  (ZANAFLEX ) 4 MG tablet Take 4 mg by mouth every 8 (eight) hours as needed for muscle spasms.   traZODone  (DESYREL ) 150 MG tablet Use from 1/3 to 1 tablet nightly as needed for sleep.   allopurinol  (ZYLOPRIM ) 300 MG tablet Take 1 tablet (300 mg total) by mouth daily.   chlorthalidone  (HYGROTON ) 25 MG tablet Take 1 tablet (25 mg total) by mouth daily.   [DISCONTINUED] allopurinol  (ZYLOPRIM ) 300 MG tablet Take 1 tablet (300 mg total) by mouth daily.   [DISCONTINUED] chlorthalidone  (HYGROTON ) 25 MG tablet TAKE 1/2 TABLET BY MOUTH  EVERY DAY IN THE MORNING WITH FOOD   No facility-administered encounter medications on file as of 07/29/2023.    Allergies (verified) Sulfa antibiotics   History: Past Medical History:  Diagnosis Date   DVT (deep venous thrombosis) (HCC)    Gout    Hypertension    Medical history non-contributory    Pulmonary embolism (HCC)    Wears glasses    Past Surgical History:  Procedure Laterality Date   COLONOSCOPY WITH PROPOFOL  N/A 08/02/2019   Procedure: COLONOSCOPY WITH PROPOFOL ;  Surgeon: Suzette Espy, MD;  Location: [facility];  Service: Endoscopy;  Laterality: N/A;  9:15am   KNEE ARTHROSCOPY WITH PATELLAR TENDON REPAIR Left 09/03/2012   Procedure: LEFT KNEE ARTHROSCOPY, LOOSE BODY EXCISION, PATELLAR TENDON REPAIR, EXCISION TIBIAL TUBERCLE SPUR, CHONDROPLASTY PATELLA, EXCISION PLICA;  Surgeon: Ferd Householder, MD;  Location: [facility];  Service: Orthopedics;  Laterality: Left;   ROTATOR CUFF REPAIR Right    jan 2025   WISDOM TOOTH EXTRACTION     Family History  Problem Relation Age of Onset   Colon cancer Mother        Precancerous colon polyps; unsure of actual CRC?   Diabetes Father    Hypertension Father    Heart attack Father    Social History   Occupational History   Not on file  Tobacco Use   Smoking status: Never   Smokeless tobacco: Never  Vaping Use   Vaping status: Never Used  Substance and Sexual Activity   Alcohol use: Yes  Comment: rare   Drug use: No   Sexual activity: Not on file    Do you feel safe at home?  No Are there smokers in your home (other than you)? No  Dietary issues and exercise activities discussed:    Current Dietary habits:  Avoids salt    Cardiac Risk Factors include: none  Objective:    Today's Vitals   07/29/23 1058  BP: (!) 98/58  Pulse: (!) 57  Temp: 97.9 F (36.6 C)  SpO2: 96%  Weight: 257 lb (116.6 kg)  Height: 6\' 1"  (1.854 m)   Body mass index is 33.91 kg/m.   Activities of Daily  Living    07/29/2023   10:44 AM  In your present state of health, do you have any difficulty performing the following activities:  Hearing? 0  Comment tinitus  Vision? 1  Difficulty concentrating or making decisions? 0  Walking or climbing stairs? 0  Comment knee pain  Dressing or bathing? 0  Doing errands, shopping? 0  Preparing Food and eating ? N  Using the Toilet? N  In the past six months, have you accidently leaked urine? N  Do you have problems with loss of bowel control? N  Managing your Medications? N  Managing your Finances? N  Housekeeping or managing your Housekeeping? N     Depression Screen    07/29/2023   10:50 AM 05/20/2023   10:10 AM 11/20/2022    9:09 AM 11/20/2022    9:03 AM  PHQ 2/9 Scores  PHQ - 2 Score 0 0 0 0  PHQ- 9 Score  0       Fall Risk    07/29/2023   10:49 AM 11/20/2022    9:09 AM 11/20/2022    9:03 AM 05/15/2022    8:45 AM 03/20/2022    1:49 PM  Fall Risk   Falls in the past year? 0 1 0 1 1  Number falls in past yr:  1  0 0  Injury with Fall? 0 1  1 1   Risk for fall due to : No Fall Risks History of fall(s)  History of fall(s) History of fall(s)  Follow up  Falls evaluation completed  Falls evaluation completed Falls evaluation completed    Cognitive Function:    07/29/2023   10:50 AM  MMSE - Mini Mental State Exam  Orientation to time 5  Orientation to Place 5  Registration 3  Attention/ Calculation 5  Recall 3  Language- name 2 objects 2  Language- repeat 1  Language- follow 3 step command 3  Language- read & follow direction 1  Write a sentence 1  Copy design 1  Total score 30    Immunizations and Health Maintenance Immunization History  Administered Date(s) Administered   Influenza Split 12/09/2016   Influenza,inj,Quad PF,6+ Mos 01/05/2015, 12/11/2017, 03/18/2019, 12/07/2019   Moderna Sars-Covid-2 Vaccination 05/29/2019, 07/06/2019, 03/29/2020   Td 03/18/2019   Health Maintenance Due  Topic Date Due   Medicare  Annual Wellness (AWV)  Never done    Patient Care Team: Roise Cleaver, MD as PCP - General (Family Medicine) Amanda Jungling, Joyceann No, MD as PCP - Cardiology (Cardiology)  Indicate any recent Medical Services you may have received from other than [facility] providers in the past year (date may be approximate).    Assessment:    Annual Wellness Visit    Screening Tests Health Maintenance  Topic Date Due   Medicare Annual Wellness (AWV)  Never done   Pneumococcal  Vaccine 73-2 Years old (1 of 2 - PCV) 05/19/2024 (Originally 02/14/1985)   INFLUENZA VACCINE  10/10/2023   DTaP/Tdap/Td (2 - Tdap) 03/17/2029   Colonoscopy  08/01/2029   Hepatitis C Screening  Completed   HIV Screening  Completed   Zoster Vaccines- Shingrix  Completed   HPV VACCINES  Aged Out   Meningococcal B Vaccine  Aged Out   COVID-19 Vaccine  Discontinued        Plan:   During the course of the visit [name] [name] was educated and counseled about the following appropriate screening and preventive services:  Vaccines to include Pneumoccal, Influenza,  Td, and Shingles Colorectal cancer screening Cardiovascular disease screening Diabetes screening Bone Denisty / Osteoporosis Screening Glaucoma screening / Diabetic Eye Exam Nutrition counseling Prostate cancer screening Smoking cessation counseling Advanced Directives Physical Activity    Goals   None      Patient Instructions (the written plan) were given to the patient.   Roise Cleaver, MD   07/29/2023

## 2023-10-10 ENCOUNTER — Ambulatory Visit (HOSPITAL_COMMUNITY)

## 2023-10-27 ENCOUNTER — Ambulatory Visit: Payer: Self-pay | Admitting: Student

## 2023-10-27 ENCOUNTER — Ambulatory Visit (HOSPITAL_COMMUNITY)
Admission: RE | Admit: 2023-10-27 | Discharge: 2023-10-27 | Disposition: A | Discharge status: Home / Self Care | Source: Ambulatory Visit | Attending: Family Medicine | Admitting: Family Medicine

## 2023-10-27 DIAGNOSIS — I50810 Right heart failure, unspecified: Secondary | ICD-10-CM | POA: Diagnosis present

## 2023-10-27 LAB — ECHOCARDIOGRAM COMPLETE
Area-P 1/2: 3.72 cm2
S' Lateral: 3.6 cm

## 2023-10-27 NOTE — Progress Notes (Signed)
*  PRELIMINARY RESULTS* Echocardiogram 2D Echocardiogram has been performed.  [name] [name] 10/27/2023, 10:48 AM

## 2023-11-11 ENCOUNTER — Other Ambulatory Visit (HOSPITAL_COMMUNITY)
Admission: RE | Admit: 2023-11-11 | Discharge: 2023-11-11 | Disposition: A | Discharge status: Home / Self Care | Source: Ambulatory Visit | Attending: Nephrology | Admitting: Nephrology

## 2023-11-11 DIAGNOSIS — I1310 Hypertensive heart and chronic kidney disease without heart failure, with stage 1 through stage 4 chronic kidney disease, or unspecified chronic kidney disease: Secondary | ICD-10-CM | POA: Insufficient documentation

## 2023-11-11 DIAGNOSIS — R809 Proteinuria, unspecified: Secondary | ICD-10-CM | POA: Diagnosis not present

## 2023-11-11 DIAGNOSIS — E1122 Type 2 diabetes mellitus with diabetic chronic kidney disease: Secondary | ICD-10-CM | POA: Insufficient documentation

## 2023-11-11 DIAGNOSIS — D631 Anemia in chronic kidney disease: Secondary | ICD-10-CM | POA: Diagnosis not present

## 2023-11-11 DIAGNOSIS — N189 Chronic kidney disease, unspecified: Secondary | ICD-10-CM | POA: Diagnosis present

## 2023-11-11 LAB — CBC
HCT: 39.4 % (ref 39.0–52.0)
Hemoglobin: 13.2 g/dL (ref 13.0–17.0)
MCH: 30.3 pg (ref 26.0–34.0)
MCHC: 33.5 g/dL (ref 30.0–36.0)
MCV: 90.6 fL (ref 80.0–100.0)
Platelets: 190 K/uL (ref 150–400)
RBC: 4.35 MIL/uL (ref 4.22–5.81)
RDW: 12.9 % (ref 11.5–15.5)
WBC: 6.2 K/uL (ref 4.0–10.5)
nRBC: 0 % (ref 0.0–0.2)

## 2023-11-11 LAB — RENAL FUNCTION PANEL
Albumin: 4 g/dL (ref 3.5–5.0)
Anion gap: 12 (ref 5–15)
BUN: 33 mg/dL — ABNORMAL HIGH (ref 6–20)
CO2: 24 mmol/L (ref 22–32)
Calcium: 9.4 mg/dL (ref 8.9–10.3)
Chloride: 102 mmol/L (ref 98–111)
Creatinine, Ser: 2.14 mg/dL — ABNORMAL HIGH (ref 0.61–1.24)
GFR, Estimated: 35 mL/min — ABNORMAL LOW (ref >60)
Glucose, Bld: 81 mg/dL (ref 70–99)
Phosphorus: 2.9 mg/dL (ref 2.5–4.6)
Potassium: 3.7 mmol/L (ref 3.5–5.1)
Sodium: 138 mmol/L (ref 135–145)

## 2023-11-12 LAB — MICROALBUMIN / CREATININE URINE RATIO
Creatinine, Urine: 118.5 mg/dL
Microalb Creat Ratio: <3 mg/g{creat} (ref 0–29)
Microalb, Ur: <3 ug/mL — ABNORMAL HIGH

## 2023-11-12 LAB — PARATHYROID HORMONE, INTACT (NO CA): PTH: 26 pg/mL (ref 15–65)

## 2023-11-19 ENCOUNTER — Inpatient Hospital Stay: Attending: Physician Assistant

## 2023-11-19 DIAGNOSIS — R5383 Other fatigue: Secondary | ICD-10-CM | POA: Insufficient documentation

## 2023-11-19 DIAGNOSIS — M109 Gout, unspecified: Secondary | ICD-10-CM | POA: Diagnosis not present

## 2023-11-19 DIAGNOSIS — Z86711 Personal history of pulmonary embolism: Secondary | ICD-10-CM | POA: Insufficient documentation

## 2023-11-19 DIAGNOSIS — I82412 Acute embolism and thrombosis of left femoral vein: Secondary | ICD-10-CM

## 2023-11-19 DIAGNOSIS — K219 Gastro-esophageal reflux disease without esophagitis: Secondary | ICD-10-CM | POA: Diagnosis not present

## 2023-11-19 DIAGNOSIS — E669 Obesity, unspecified: Secondary | ICD-10-CM | POA: Diagnosis not present

## 2023-11-19 DIAGNOSIS — Z86718 Personal history of other venous thrombosis and embolism: Secondary | ICD-10-CM | POA: Diagnosis not present

## 2023-11-19 DIAGNOSIS — M25569 Pain in unspecified knee: Secondary | ICD-10-CM | POA: Insufficient documentation

## 2023-11-19 DIAGNOSIS — I129 Hypertensive chronic kidney disease with stage 1 through stage 4 chronic kidney disease, or unspecified chronic kidney disease: Secondary | ICD-10-CM | POA: Insufficient documentation

## 2023-11-19 DIAGNOSIS — I2609 Other pulmonary embolism with acute cor pulmonale: Secondary | ICD-10-CM

## 2023-11-19 DIAGNOSIS — N1832 Chronic kidney disease, stage 3b: Secondary | ICD-10-CM | POA: Diagnosis not present

## 2023-11-19 DIAGNOSIS — Z7901 Long term (current) use of anticoagulants: Secondary | ICD-10-CM | POA: Diagnosis not present

## 2023-11-19 DIAGNOSIS — Z800 Family history of malignant neoplasm of digestive organs: Secondary | ICD-10-CM | POA: Diagnosis not present

## 2023-11-19 LAB — CBC WITH DIFFERENTIAL/PLATELET
Abs Immature Granulocytes: 0.02 K/uL (ref 0.00–0.07)
Basophils Absolute: 0 K/uL (ref 0.0–0.1)
Basophils Relative: 0 %
Eosinophils Absolute: 0 K/uL (ref 0.0–0.5)
Eosinophils Relative: 0 %
HCT: 38.8 % — ABNORMAL LOW (ref 39.0–52.0)
Hemoglobin: 13.3 g/dL (ref 13.0–17.0)
Immature Granulocytes: 0 %
Lymphocytes Relative: 17 %
Lymphs Abs: 1.1 K/uL (ref 0.7–4.0)
MCH: 30.6 pg (ref 26.0–34.0)
MCHC: 34.3 g/dL (ref 30.0–36.0)
MCV: 89.2 fL (ref 80.0–100.0)
Monocytes Absolute: 0.6 K/uL (ref 0.1–1.0)
Monocytes Relative: 9 %
Neutro Abs: 4.6 K/uL (ref 1.7–7.7)
Neutrophils Relative %: 74 %
Platelets: 166 K/uL (ref 150–400)
RBC: 4.35 MIL/uL (ref 4.22–5.81)
RDW: 12.8 % (ref 11.5–15.5)
WBC: 6.2 K/uL (ref 4.0–10.5)
nRBC: 0 % (ref 0.0–0.2)

## 2023-11-19 LAB — BASIC METABOLIC PANEL WITH GFR
Anion gap: 12 (ref 5–15)
BUN: 16 mg/dL (ref 6–20)
CO2: 24 mmol/L (ref 22–32)
Calcium: 9.1 mg/dL (ref 8.9–10.3)
Chloride: 103 mmol/L (ref 98–111)
Creatinine, Ser: 1.67 mg/dL — ABNORMAL HIGH (ref 0.61–1.24)
GFR, Estimated: 47 mL/min — ABNORMAL LOW (ref >60)
Glucose, Bld: 94 mg/dL (ref 70–99)
Potassium: 3.9 mmol/L (ref 3.5–5.1)
Sodium: 139 mmol/L (ref 135–145)

## 2023-11-19 LAB — D-DIMER, QUANTITATIVE: D-Dimer, Quant: <0.27 ug{FEU}/mL (ref 0.00–0.50)

## 2023-11-25 NOTE — Progress Notes (Unsigned)
 [facility] [address], [place] [address]   CLINIC:  Medical Oncology/Hematology  PCP:  Zollie Lowers, MD [address] [place] [address] [phone]   REASON FOR VISIT:  Follow-up for unprovoked DVT/PE  CURRENT THERAPY: Eliquis   INTERVAL HISTORY:   [name] 58 y.o. male returns for routine follow-up of his history of unprovoked DVT and PE.   He was last seen by Pleasant Barefoot PA-C on 11/19/2022.  At today's visit, he reports feeling fair apart from some fatigue and knee pain. He continues to have mild intermittent lower extremity swelling when he spends too much time in a standing or seated position; left greater than right; resolves within a few days. He denies any chest pain, palpitations, dyspnea, cough, or hemoptysis. He reports that he is compliant with Eliquis  5 mg twice daily and denies any major bleeding events.  He has 50% energy and 100% appetite. He endorses that he is maintaining a stable weight.  ASSESSMENT & PLAN:  1.  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  Initial symptoms were left leg pain followed by difficulty breathing and chest tightness and syncopal episode the following day.  Hypotensive in ED with blood pressure 70/50.  He was found to have left leg DVT as well as PE on VQ scan with right heart strain noted on echo. Deemed not to be a candidate for intervention by radiology. He was admitted for IV anticoagulation and transition to Eliquis  after 5 days.   - Venous US  left leg (03/12/2022): Left femoral popliteal DVT extending into the calf veins - V/Q scan (03/13/2022): Scintigraphic findings compatible with pulmonary embolus (large perfusion defects in right lung with few bilateral small peripheral perfusion defects) - DVT/PE was unprovoked - no preceding trauma, illness, hospitalization, surgery, or inactivity - No known family  history of coagulopathy, but patient's father had blood clot in his 11s and both of his daughters have had miscarriages; patient's mother had miscarriages well. - Coagulopathy workup (04/16/2022): D-dimer elevated at 0.66, but trending downward since >20 at time of diagnosis the month prior Factor V Leiden and prothrombin gene mutation are negative. Anticardiolipin and antibeta 2 glycoprotein antibodies are negative. Initial Lupus anticoagulant was POSITIVE.  Repeat lupus anticoagulant (08/13/2022) was NEGATIVE. - Tolerating Eliquis  well without any bleeding events - Most recent labs (11/19/2023): Grossly normal CBC.  Baseline CKD stage IIIa/b.  D-dimer undetectable. - Continues to have some trace lower extremity edema and bilateral calf soreness, but this is improving.  Intermittent sharp nonpleuritic chest pain improving after being started on Eliquis . - PLAN: At this time, patient is stable for discharge to PCP.  We continue to recommend indefinite anticoagulation, which can be prescribed and managed by PCP.  Patient should return to hematology office if he has any breakthrough VTE despite compliance with Eliquis  or if he is considering discontinuing coagulation for any reason (including major bleeding events).   2.  Other history - PMH: Hypertension, gout, CKD stage IIIb, obesity, and GERD.  - SOCIAL: He is retired from 35+ year career in [employer]. He lives at home with his wife. He drinks occasional alcohol. He denies any tobacco or illicit drug use.  - FAMILY: No known family history of coagulopathy, but reports that his father had blood clots in his 80s. One of his daughters miscarried x 2, another daughter had miscarriage x 1.  Patient's  mother had miscarriage x 1.  Mother had (questionable) history of colon cancer versus precancerous colon polyps.  He has a maternal uncle with unspecified type of cancer.   PLAN SUMMARY >> Discharge to PCP      REVIEW OF SYSTEMS:  Review of Systems   Constitutional:  Positive for fatigue. Negative for appetite change, chills, diaphoresis, fever and unexpected weight change.  HENT:   Negative for lump/mass and nosebleeds.   Eyes:  Negative for eye problems.  Respiratory:  Negative for cough, hemoptysis and shortness of breath.   Cardiovascular:  Positive for leg swelling (at times). Negative for chest pain and palpitations.  Gastrointestinal:  Negative for abdominal pain, blood in stool, constipation, diarrhea, nausea and vomiting.  Genitourinary:  Negative for hematuria.   Musculoskeletal:  Positive for arthralgias. Negative for back pain.  Skin: Negative.   Neurological:  Positive for numbness. Negative for dizziness, headaches and light-headedness.  Hematological:  Does not bruise/bleed easily.     PHYSICAL EXAM:  ECOG PERFORMANCE STATUS: 1 - Symptomatic but completely ambulatory  Vitals:   11/26/23 0815  BP: 122/71  Pulse: 60  Resp: 18  Temp: 98.5 F (36.9 C)  SpO2: 99%    Filed Weights   11/26/23 0815  Weight: 257 lb 4.4 oz (116.7 kg)    Physical Exam Constitutional:      Appearance: Normal appearance. He is obese.  Cardiovascular:     Heart sounds: Normal heart sounds.  Pulmonary:     Breath sounds: Normal breath sounds.  Musculoskeletal:     Right lower leg: No edema.     Left lower leg: Edema (trace) present.  Neurological:     General: No focal deficit present.     Mental Status: Mental status is at baseline.  Psychiatric:        Behavior: Behavior normal. Behavior is cooperative.    PAST MEDICAL/SURGICAL HISTORY:  Past Medical History:  Diagnosis Date   DVT (deep venous thrombosis) (HCC)    Gout    Hypertension    Medical history non-contributory    Pulmonary embolism (HCC)    Wears glasses    Past Surgical History:  Procedure Laterality Date   COLONOSCOPY WITH PROPOFOL  N/A 08/02/2019   Procedure: COLONOSCOPY WITH PROPOFOL ;  Surgeon: Shaaron Lamar HERO, MD;  [facility];  Service:  Endoscopy;  Laterality: N/A;  9:15am   KNEE ARTHROSCOPY WITH PATELLAR TENDON REPAIR Left 09/03/2012   Procedure: LEFT KNEE ARTHROSCOPY, LOOSE BODY EXCISION, PATELLAR TENDON REPAIR, EXCISION TIBIAL TUBERCLE SPUR, CHONDROPLASTY PATELLA, EXCISION PLICA;  Surgeon: Toribio JULIANNA Chancy, MD;  Location: [facility];  Service: Orthopedics;  Laterality: Left;   ROTATOR CUFF REPAIR Right    jan 2025   WISDOM TOOTH EXTRACTION      SOCIAL HISTORY:  Social History   Socioeconomic History   Marital status: Married    Spouse name: Not on file   Number of children: Not on file   Years of education: Not on file   Highest education level: Not on file  Occupational History   Not on file  Tobacco Use   Smoking status: Never   Smokeless tobacco: Never  Vaping Use   Vaping status: Never Used  Substance and Sexual Activity   Alcohol use: Yes    Comment: rare   Drug use: No   Sexual activity: Not on file  Other Topics Concern   Not on file  Social History Narrative   Not on file   Social  Drivers of Corporate investment banker Strain: Low Risk  (07/29/2023)   Overall Financial Resource Strain (CARDIA)    Difficulty of Paying Living Expenses: Not hard at all  Food Insecurity: No Food Insecurity (04/16/2022)   Hunger Vital Sign    Worried About Running Out of Food in the Last Year: Never true    Ran Out of Food in the Last Year: Never true  Transportation Needs: No Transportation Needs (04/16/2022)   PRAPARE - Administrator, Civil Service (Medical): No    Lack of Transportation (Non-Medical): No  Physical Activity: Insufficiently Active (07/29/2023)   Exercise Vital Sign    Days of Exercise per Week: 7 days    Minutes of Exercise per Session: 20 min  Stress: No Stress Concern Present (07/29/2023)   [employer] of Occupational Health - Occupational Stress Questionnaire    Feeling of Stress : Only a little  Social Connections: Socially Integrated (07/29/2023)   Social  Connection and Isolation Panel    Frequency of Communication with Friends and Family: Three times a week    Frequency of Social Gatherings with Friends and Family: Once a week    Attends Religious Services: More than 4 times per year    Active Member of [employer] or Organizations: Yes    Attends Engineer, structural: More than 4 times per year    Marital Status: Married  Catering manager Violence: Not At Risk (04/16/2022)   Humiliation, Afraid, Rape, and Kick questionnaire    Fear of Current or Ex-Partner: No    Emotionally Abused: No    Physically Abused: No    Sexually Abused: No    FAMILY HISTORY:  Family History  Problem Relation Age of Onset   Colon cancer Mother        Precancerous colon polyps; unsure of actual CRC?   Diabetes Father    Hypertension Father    Heart attack Father     CURRENT MEDICATIONS:  Outpatient Encounter Medications as of 11/26/2023  Medication Sig   acetaminophen  (TYLENOL ) 500 MG tablet Take 500 mg by mouth every 6 (six) hours as needed for mild pain or headache.   allopurinol  (ZYLOPRIM ) 300 MG tablet Take 1 tablet (300 mg total) by mouth daily.   apixaban  (ELIQUIS ) 5 MG TABS tablet Take 5 mg by mouth 2 (two) times daily.   chlorthalidone  (HYGROTON ) 25 MG tablet Take 1 tablet (25 mg total) by mouth daily.   Cholecalciferol (VITAMIN D -3 PO) Take 1 tablet by mouth daily.   metoprolol  succinate (TOPROL -XL) 100 MG 24 hr tablet TAKE 1 TABLET BY MOUTH EVERY DAY WITH OR IMMEDIATELY FOLLOWING A MEAL   traZODone  (DESYREL ) 150 MG tablet Use from 1/3 to 1 tablet nightly as needed for sleep.   No facility-administered encounter medications on file as of 11/26/2023.    ALLERGIES:  Allergies  Allergen Reactions   Sulfa Antibiotics     Unknown-was told that    LABORATORY DATA:  I have reviewed the labs as listed.  CBC    Component Value Date/Time   WBC 6.2 11/19/2023 1021   RBC 4.35 11/19/2023 1021   HGB 13.3 11/19/2023 1021   HGB 13.5 05/20/2023  1033   HCT 38.8 (L) 11/19/2023 1021   HCT 39.7 05/20/2023 1033   PLT 166 11/19/2023 1021   PLT 192 05/20/2023 1033   MCV 89.2 11/19/2023 1021   MCV 89 05/20/2023 1033   MCH 30.6 11/19/2023 1021   MCHC 34.3 11/19/2023 1021  RDW 12.8 11/19/2023 1021   RDW 12.9 05/20/2023 1033   LYMPHSABS 1.1 11/19/2023 1021   LYMPHSABS 1.1 05/20/2023 1033   MONOABS 0.6 11/19/2023 1021   EOSABS 0.0 11/19/2023 1021   EOSABS 0.1 05/20/2023 1033   BASOSABS 0.0 11/19/2023 1021   BASOSABS 0.0 05/20/2023 1033      Latest Ref Rng & Units 11/19/2023   10:21 AM 11/11/2023   10:25 AM 05/20/2023   10:33 AM  CMP  Glucose 70 - 99 mg/dL 94  81  884   BUN 6 - 20 mg/dL 16  33  17   Creatinine 0.61 - 1.24 mg/dL 8.32  7.85  8.12   Sodium 135 - 145 mmol/L 139  138  141   Potassium 3.5 - 5.1 mmol/L 3.9  3.7  3.8   Chloride 98 - 111 mmol/L 103  102  103   CO2 22 - 32 mmol/L 24  24  24    Calcium 8.9 - 10.3 mg/dL 9.1  9.4  9.4   Total Protein 6.0 - 8.5 g/dL   6.7   Total Bilirubin 0.0 - 1.2 mg/dL   0.5   Alkaline Phos 44 - 121 IU/L   63   AST 0 - 40 IU/L   22   ALT 0 - 44 IU/L   20     DIAGNOSTIC IMAGING:  I have independently reviewed the relevant imaging and discussed with the patient.   WRAP UP:  All questions were answered. The patient knows to call the clinic with any problems, questions or concerns.  Medical decision making: Low  Time spent on visit: I spent 15 minutes counseling the patient face to face. The total time spent in the appointment was 22 minutes and more than 50% was on counseling.  Pleasant CHRISTELLA Barefoot, PA-C  11/26/23 8:58 AM

## 2023-11-26 ENCOUNTER — Inpatient Hospital Stay (HOSPITAL_BASED_OUTPATIENT_CLINIC_OR_DEPARTMENT_OTHER): Admitting: Physician Assistant

## 2023-11-26 ENCOUNTER — Ambulatory Visit: Admitting: Family Medicine

## 2023-11-26 ENCOUNTER — Encounter: Payer: Self-pay | Admitting: Family Medicine

## 2023-11-26 VITALS — BP 106/60 | HR 66 | Temp 98.2°F | Ht 73.0 in | Wt 257.0 lb

## 2023-11-26 VITALS — BP 122/71 | HR 60 | Temp 98.5°F | Resp 18 | Wt 257.3 lb

## 2023-11-26 DIAGNOSIS — Z7901 Long term (current) use of anticoagulants: Secondary | ICD-10-CM | POA: Diagnosis not present

## 2023-11-26 DIAGNOSIS — E782 Mixed hyperlipidemia: Secondary | ICD-10-CM

## 2023-11-26 DIAGNOSIS — I2609 Other pulmonary embolism with acute cor pulmonale: Secondary | ICD-10-CM

## 2023-11-26 DIAGNOSIS — Z23 Encounter for immunization: Secondary | ICD-10-CM | POA: Diagnosis not present

## 2023-11-26 DIAGNOSIS — I82412 Acute embolism and thrombosis of left femoral vein: Secondary | ICD-10-CM

## 2023-11-26 DIAGNOSIS — Z86711 Personal history of pulmonary embolism: Secondary | ICD-10-CM | POA: Diagnosis not present

## 2023-11-26 DIAGNOSIS — N401 Enlarged prostate with lower urinary tract symptoms: Secondary | ICD-10-CM

## 2023-11-26 DIAGNOSIS — F5101 Primary insomnia: Secondary | ICD-10-CM | POA: Diagnosis not present

## 2023-11-26 DIAGNOSIS — K2100 Gastro-esophageal reflux disease with esophagitis, without bleeding: Secondary | ICD-10-CM | POA: Diagnosis not present

## 2023-11-26 DIAGNOSIS — M25561 Pain in right knee: Secondary | ICD-10-CM | POA: Diagnosis not present

## 2023-11-26 DIAGNOSIS — R350 Frequency of micturition: Secondary | ICD-10-CM

## 2023-11-26 DIAGNOSIS — I82512 Chronic embolism and thrombosis of left femoral vein: Secondary | ICD-10-CM | POA: Diagnosis not present

## 2023-11-26 DIAGNOSIS — Z0000 Encounter for general adult medical examination without abnormal findings: Secondary | ICD-10-CM

## 2023-11-26 DIAGNOSIS — Z125 Encounter for screening for malignant neoplasm of prostate: Secondary | ICD-10-CM

## 2023-11-26 DIAGNOSIS — I10 Essential (primary) hypertension: Secondary | ICD-10-CM

## 2023-11-26 LAB — URINALYSIS
Bilirubin, UA: NEGATIVE
Glucose, UA: NEGATIVE
Ketones, UA: NEGATIVE
Leukocytes,UA: NEGATIVE
Nitrite, UA: NEGATIVE
Protein,UA: NEGATIVE
RBC, UA: NEGATIVE
Specific Gravity, UA: 1.01 (ref 1.005–1.030)
Urobilinogen, Ur: 0.2 mg/dL (ref 0.2–1.0)
pH, UA: 7 (ref 5.0–7.5)

## 2023-11-26 NOTE — Patient Instructions (Signed)
 [facility] at [facility] **VISIT SUMMARY & IMPORTANT INSTRUCTIONS **   You were seen today by Pleasant Barefoot PA-C for your recent diagnosis of blood clots in your left leg and lungs.   Since your blood clots were unprovoked you will need to be on lifelong blood thinners to prevent new episodes of blood clots in the future. At this point in time, you can follow-up with your primary care provider for management of your blood clots, but with the following recommendations: Continue taking indefinite / lifelong blood thinner with Eliquis . Do not stop taking Eliquis  for any reason without speaking to your doctor first. Return to hematology clinic/Cancer Center if you have any new blood clots despite taking Eliquis  or are considering stopping Eliquis  entirely. See the attached handout for important information regarding bleeding precautions while taking a blood thinner, and for information regarding symptoms of recurrent blood clots that would need immediate medical attention.  MEDICATIONS: Continue Eliquis  5 mg twice daily.   - - - - - - - - - - - - - - - - - -    Thank you for choosing  Cancer Center at [facility] to provide your oncology and hematology care.  To afford each patient quality time with our provider, please arrive at least 15 minutes before your scheduled appointment time.   If you have a lab appointment with the Cancer Center please come in thru the Main Entrance and check in at the main information desk.  You need to re-schedule your appointment should you arrive 10 or more minutes late.  We strive to give you quality time with our providers, and arriving late affects you and other patients whose appointments are after yours.  Also, if you no show three or more times for appointments you may be dismissed from the clinic at the providers discretion.     Again, thank you for choosing [facility].  Our hope is that these  requests will decrease the amount of time that you wait before being seen by our physicians.       _____________________________________________________________  Should you have questions after your visit to [facility], please contact our office at [phone] and follow the prompts.  Our office hours are 8:00 a.m. and 4:30 p.m. Monday - Friday.  Please note that voicemails left after 4:00 p.m. may not be returned until the following business day.  We are closed weekends and major holidays.  You do have access to a nurse 24-7, just call the main number to the clinic [phone] and do not press any options, hold on the line and a nurse will answer the phone.    For prescription refill requests, have your pharmacy contact our office and allow 72 hours.

## 2023-11-26 NOTE — Progress Notes (Signed)
 Subjective:  Patient ID: [name] J Guinea-Bissau Jr., male    DOB: 12-Sep-1965  Age: 58 y.o. MRN: [id number]  CC: Annual Exam   HPI  Discussed the use of AI scribe software for clinical note transcription with the patient, who gave verbal consent to proceed.  History of Present Illness [name] J Guinea-Bissau Jr. is a 58 year old male with a history of blood clots who presents with numbness and tingling in his hands and right shoulder pain.  He has been experiencing numbness and tingling in his hands, particularly when lying on his right side, for at least three months. There is a 'prickly' sensation in the front of his right shoulder near scar tissue, but no significant pain or discomfort with movement. The numbness primarily occurs when lying on the shoulder.  He is currently on Eliquis  for blood clots and has a history of clots in his legs and lungs.  He reports right knee pain that began approximately one week ago after hearing a popping and clicking sound when getting out of bed. The pain persists, especially when walking up steps, but there is no instability or significant swelling in the knee.  He mentions a recent weight change, stating he was 243 pounds about a week and a half ago, but was recorded at 257 pounds today, attributing the difference to clothing and personal items.  No shortness of breath, allergies, congestion, or gastrointestinal symptoms such as blood in stools or hemorrhoids.          11/26/2023   10:02 AM 11/26/2023    8:15 AM 07/29/2023   10:50 AM  Depression screen PHQ 2/9  Decreased Interest 0 0 0  Down, Depressed, Hopeless 0 0 0  PHQ - 2 Score 0 0 0  Altered sleeping 2    Tired, decreased energy 1    Change in appetite 0    Feeling bad or failure about yourself  0    Trouble concentrating 0    Moving slowly or fidgety/restless 0    Suicidal thoughts 0    PHQ-9 Score 3    Difficult doing work/chores Somewhat difficult      History Tesla has a past medical history  of DVT (deep venous thrombosis) (HCC), Gout, Hypertension, Medical history non-contributory, Pulmonary embolism (HCC), and Wears glasses.   He has a past surgical history that includes Wisdom tooth extraction; Knee arthroscopy with patellar tendon repair (Left, 09/03/2012); Colonoscopy with propofol  (N/A, 08/02/2019); and Rotator cuff repair (Right).   His family history includes Colon cancer in his mother; Diabetes in his father; Heart attack in his father; Hypertension in his father.He reports that he has never smoked. He has never used smokeless tobacco. He reports current alcohol use. He reports that he does not use drugs.    ROS Review of Systems  Constitutional:  Negative for activity change, fatigue and unexpected weight change.  HENT:  Negative for congestion, ear pain, hearing loss, postnasal drip and trouble swallowing.   Eyes:  Negative for pain and visual disturbance.  Respiratory:  Negative for cough, chest tightness and shortness of breath.   Cardiovascular:  Negative for chest pain, palpitations and leg swelling.  Gastrointestinal:  Negative for abdominal distention, abdominal pain, blood in stool, constipation, diarrhea, nausea and vomiting.  Endocrine: Negative for cold intolerance, heat intolerance and polydipsia.  Genitourinary:  Negative for difficulty urinating, dysuria, flank pain, frequency and urgency.  Musculoskeletal:  Positive for arthralgias. Negative for joint swelling.  Skin:  Negative for color  change, rash and wound.  Neurological:  Positive for numbness (fingers). Negative for dizziness, syncope, speech difficulty, weakness, light-headedness and headaches.  Hematological:  Does not bruise/bleed easily.  Psychiatric/Behavioral:  Negative for confusion, decreased concentration, dysphoric mood and sleep disturbance. The patient is not nervous/anxious.     Objective:  BP 106/60   Pulse 66   Temp 98.2 F (36.8 C)   Ht 6' 1 (1.854 m)   Wt 257 lb (116.6 kg)    SpO2 97%   BMI 33.91 kg/m   BP Readings from Last 3 Encounters:  11/26/23 106/60  11/26/23 122/71  07/29/23 (!) 98/58    Wt Readings from Last 3 Encounters:  11/26/23 257 lb (116.6 kg)  11/26/23 257 lb 4.4 oz (116.7 kg)  07/29/23 257 lb (116.6 kg)     Physical Exam Constitutional:      Appearance: He is well-developed.  HENT:     Head: Normocephalic and atraumatic.  Eyes:     Pupils: Pupils are equal, round, and reactive to light.  Neck:     Thyroid : No thyromegaly.     Trachea: No tracheal deviation.  Cardiovascular:     Rate and Rhythm: Normal rate and regular rhythm.     Heart sounds: Normal heart sounds. No murmur heard.    No friction rub. No gallop.  Pulmonary:     Breath sounds: Normal breath sounds. No wheezing or rales.  Abdominal:     General: Bowel sounds are normal. There is no distension.     Palpations: Abdomen is soft. There is no mass.     Tenderness: There is no abdominal tenderness.     Hernia: There is no hernia in the left inguinal area.  Genitourinary:    Penis: Normal.      Testes: Normal.  Musculoskeletal:        General: Normal range of motion.     Cervical back: Normal range of motion.  Lymphadenopathy:     Cervical: No cervical adenopathy.  Skin:    General: Skin is warm and dry.  Neurological:     Mental Status: He is alert and oriented to person, place, and time.      Assessment & Plan:  Well adult exam -     CMP14+EGFR -     Lipid panel -     Urinalysis  Encounter for immunization -     Flu vaccine trivalent PF, 6mos and older(Flulaval,Afluria,Fluarix,Fluzone)  Right knee pain, unspecified chronicity -     CMP14+EGFR -     Urinalysis -     DG Knee 1-2 Views Right; Future  Chronic deep vein thrombosis (DVT) of femoral vein of left lower extremity (HCC) -     CMP14+EGFR -     Urinalysis  Screening for prostate cancer  Primary insomnia  Gastroesophageal reflux disease with esophagitis without hemorrhage  Primary  hypertension  Benign prostatic hyperplasia with urinary frequency -     PSA, total and free  Mixed hyperlipidemia -     Lipid panel    Assessment and Plan Assessment & Plan Right knee pain   The right knee pain, accompanied by popping and clicking sounds, is likely due to a meniscal tear, with pain worsening when walking up steps. An X-ray will not detect a meniscal tear, so an MRI is needed for confirmation. However, Medicare requires physical therapy first. Order a knee X-ray and initiate six weeks of physical therapy. Consider an MRI if there is no improvement after therapy.  Right shoulder numbness and tingling   He experiences numbness and tingling in the right shoulder and hand, worsened by lying on the right side. These symptoms have persisted for at least three months. There is no pain with movement, but a prickly sensation is noted near scar tissue.  Obesity   He is obese, weighing 257 lbs at a height of 6'1. Although recent weight loss is reported, a further reduction of 20-30 lbs is recommended for health benefits. Encourage weight loss of 20-30 lbs and monitor weight while providing guidance on lifestyle modifications.  Deep vein thrombosis and pulmonary embolism   He has a history of DVT and pulmonary embolism and remains on chronic anticoagulation due to a high risk of clotting. Continue Apixaban  (Eliquis ) 5 mg orally twice daily.       Follow-up: Return in about 6 months (around 05/25/2024).  Butler Der, M.D.

## 2023-11-27 LAB — CMP14+EGFR
ALT: 18 IU/L (ref 0–44)
AST: 20 IU/L (ref 0–40)
Albumin: 4.4 g/dL (ref 3.8–4.9)
Alkaline Phosphatase: 68 IU/L (ref 47–123)
BUN/Creatinine Ratio: 10 (ref 9–20)
BUN: 18 mg/dL (ref 6–24)
Bilirubin Total: 0.5 mg/dL (ref 0.0–1.2)
CO2: 26 mmol/L (ref 20–29)
Calcium: 9.5 mg/dL (ref 8.7–10.2)
Chloride: 98 mmol/L (ref 96–106)
Creatinine, Ser: 1.87 mg/dL — ABNORMAL HIGH (ref 0.76–1.27)
Globulin, Total: 2.4 g/dL (ref 1.5–4.5)
Glucose: 74 mg/dL (ref 70–99)
Potassium: 4 mmol/L (ref 3.5–5.2)
Sodium: 137 mmol/L (ref 134–144)
Total Protein: 6.8 g/dL (ref 6.0–8.5)
eGFR: 41 mL/min/1.73 — ABNORMAL LOW (ref >59)

## 2023-11-27 LAB — PSA, TOTAL AND FREE
PSA, Free Pct: 27.5 %
PSA, Free: 0.44 ng/mL
Prostate Specific Ag, Serum: 1.6 ng/mL (ref 0.0–4.0)

## 2023-11-27 LAB — LIPID PANEL
Chol/HDL Ratio: 3.3 ratio (ref 0.0–5.0)
Cholesterol, Total: 158 mg/dL (ref 100–199)
HDL: 48 mg/dL (ref >39)
LDL Chol Calc (NIH): 93 mg/dL (ref 0–99)
Triglycerides: 92 mg/dL (ref 0–149)
VLDL Cholesterol Cal: 17 mg/dL (ref 5–40)

## 2023-12-02 ENCOUNTER — Ambulatory Visit: Payer: Self-pay | Admitting: Family Medicine

## 2023-12-04 ENCOUNTER — Ambulatory Visit (INDEPENDENT_AMBULATORY_CARE_PROVIDER_SITE_OTHER)

## 2023-12-04 ENCOUNTER — Other Ambulatory Visit

## 2023-12-04 DIAGNOSIS — M25561 Pain in right knee: Secondary | ICD-10-CM | POA: Diagnosis not present

## 2023-12-04 NOTE — Progress Notes (Signed)
Hello Draylen,  Your lab result is normal and/or stable.Some minor variations that are not significant are commonly marked abnormal, but do not represent any medical problem for you.  Best regards, Mechele Claude, M.D.

## 2023-12-16 ENCOUNTER — Other Ambulatory Visit: Payer: Self-pay | Admitting: Family Medicine

## 2023-12-16 NOTE — Telephone Encounter (Unsigned)
 Copied from CRM [id number]. Topic: Clinical - Medication Refill >> Dec 16, 2023  3:57 PM Shardie S wrote: Medication: metoprolol  succinate (TOPROL -XL) 100 MG 24 hr tablet  Has the patient contacted their pharmacy? Yes (Agent: If no, request that the patient contact the pharmacy for the refill. If patient does not wish to contact the pharmacy document the reason why and proceed with request.) (Agent: If yes, when and what did the pharmacy advise?)  This is the patient's preferred pharmacy:  [id number] - [place], [place] - [address] [place] [address] Phone: [phone] Fax: [phone]  Is this the correct pharmacy for this prescription? Yes If no, delete pharmacy and type the correct one.   Has the prescription been filled recently? No  Is the patient out of the medication? Yes  Has the patient been seen for an appointment in the last year OR does the patient have an upcoming appointment? Yes  Can we respond through MyChart? No  Agent: Please be advised that Rx refills may take up to 3 business days. We ask that you follow-up with your pharmacy.

## 2024-02-17 ENCOUNTER — Other Ambulatory Visit: Payer: Self-pay

## 2024-02-17 ENCOUNTER — Telehealth: Payer: Self-pay | Admitting: Family Medicine

## 2024-02-17 DIAGNOSIS — M25561 Pain in right knee: Secondary | ICD-10-CM

## 2024-02-17 DIAGNOSIS — M1990 Unspecified osteoarthritis, unspecified site: Secondary | ICD-10-CM

## 2024-02-17 NOTE — Telephone Encounter (Signed)
 Yes, please.

## 2024-02-17 NOTE — Telephone Encounter (Signed)
 Copied from CRM #[id number]. Topic: Clinical - Lab/Test Results >> Feb 17, 2024 12:41 PM Carrielelia G wrote: Reason for CRM: Patient would like a call back regarding his xray results on his knees. Patient stated he was never called.   Please call

## 2024-02-17 NOTE — Telephone Encounter (Signed)
 Referral placed. LS

## 2024-03-02 ENCOUNTER — Ambulatory Visit: Attending: Cardiology | Admitting: Cardiology

## 2024-03-02 ENCOUNTER — Encounter: Payer: Self-pay | Admitting: *Deleted

## 2024-03-02 ENCOUNTER — Encounter: Payer: Self-pay | Admitting: Cardiology

## 2024-03-02 VITALS — BP 124/80 | HR 57 | Ht 73.0 in | Wt 250.8 lb

## 2024-03-02 DIAGNOSIS — E0789 Other specified disorders of thyroid: Secondary | ICD-10-CM | POA: Insufficient documentation

## 2024-03-02 DIAGNOSIS — I10 Essential (primary) hypertension: Secondary | ICD-10-CM | POA: Diagnosis not present

## 2024-03-02 DIAGNOSIS — Z86711 Personal history of pulmonary embolism: Secondary | ICD-10-CM | POA: Insufficient documentation

## 2024-03-02 DIAGNOSIS — N1832 Chronic kidney disease, stage 3b: Secondary | ICD-10-CM | POA: Diagnosis not present

## 2024-03-02 DIAGNOSIS — I50810 Right heart failure, unspecified: Secondary | ICD-10-CM | POA: Diagnosis not present

## 2024-03-02 NOTE — Progress Notes (Signed)
 "     Clinical Summary [name] [name] is a 58 y.o.male seen today for follow up of the following medical problems.   DVT/PE/RV strain  Jan 2024 US  + left DVT Jan 2024 VQ scan: + PE Jan 2024 echo: LVEF 60-65%, no WMAs, normal diastolic fxn, D shaped septum, RV function severely redcued, +Mcconnels sign.    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  - denies SOB/DOE, some LE edema at times.    2.CKD   3. HTN  4. 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     allopurinol  (ZYLOPRIM ) 300 MG tablet Take 1 tablet (300 mg total) by mouth daily. 90 tablet 0   apixaban  (ELIQUIS ) 5 MG TABS tablet Take 5 mg by mouth 2 (two) times daily.     chlorthalidone  (HYGROTON ) 25 MG tablet Take 1 tablet (25 mg total) by mouth daily. 45 tablet 0   Cholecalciferol (VITAMIN D -3 PO) Take 1 tablet by mouth daily.     metoprolol  succinate (TOPROL -XL) 100 MG 24 hr tablet TAKE 1 TABLET BY MOUTH EVERY DAY WITH OR IMMEDIATELY FOLLOWING A MEAL 90 tablet 1   traZODone  (DESYREL ) 150 MG tablet Use from 1/3 to 1 tablet nightly as needed for sleep. 90 tablet 3   No current facility-administered medications for this visit.     Past Surgical History:  Procedure Laterality Date   COLONOSCOPY WITH PROPOFOL  N/A  08/02/2019   Procedure: COLONOSCOPY WITH PROPOFOL ;  Surgeon: Shaaron Lamar HERO, MD;  Location: [facility];  Service: Endoscopy;  Laterality: N/A;  9:15am   KNEE ARTHROSCOPY WITH PATELLAR TENDON REPAIR Left 09/03/2012   Procedure: LEFT KNEE ARTHROSCOPY, LOOSE BODY EXCISION, PATELLAR TENDON REPAIR, EXCISION TIBIAL TUBERCLE SPUR, CHONDROPLASTY PATELLA, EXCISION PLICA;  Surgeon: Toribio JULIANNA Chancy, MD;  Location: [facility];  Service: Orthopedics;  Laterality: Left;   ROTATOR CUFF REPAIR Right    jan 2025   WISDOM TOOTH EXTRACTION       Allergies[2]    Family History  Problem Relation Age of Onset   Colon cancer Mother        Precancerous colon polyps; unsure of actual CRC?   Diabetes Father    Hypertension Father    Heart attack Father      Social History [name] [name] reports that he has never smoked. He has never used smokeless tobacco. [name] [name] reports current alcohol use.     Physical Examination Today's Vitals   03/02/24 0836  BP: 124/80  Pulse: (!) 57  SpO2: 94%  Weight: 250 lb 12.8 oz (113.8 kg)  Height: 6' 1 (1.854 m)   Body mass index is 33.09 kg/m.  Gen: resting comfortably, no acute distress HEENT: no scleral icterus, pupils equal  round and reactive, no palptable cervical adenopathy,  CV: RRR, no m/rg, no jvd Resp: Clear to auscultation bilaterally GI: abdomen is soft, non-tender, non-distended, normal bowel sounds, no hepatosplenomegaly MSK: extremities are warm, no edema.  Skin: warm, no rash Neuro:  no focal deficits Psych: appropriate affect     Assessment and Plan  1.DVT/PE/RV strain - defer anticoagulation management to hematology -RV strain has resolved, no further workup or management indicated  2. Autosomal dominant transthyretin mutation - noted by nephrology as part of CKD workup - kidney biopsy was negative for amyloid - echo without significant LVH, normal LVEF and grade I diastolic dysfunction - given detection of  mutation will plan for PYP scan     Dorn PHEBE Ross, M.D     [1]  Allergies Allergen Reactions   Sulfa Antibiotics     Unknown-was told that  [2]  Allergies Allergen Reactions   Sulfa Antibiotics     Unknown-was told that   "

## 2024-03-02 NOTE — Patient Instructions (Addendum)
 Medication Instructions:   Continue all current medications.   Labwork:  none  Testing/Procedures:  PYP scan - myocardial amyloid imaging  Office will contact with results via phone, letter or mychart.     Follow-Up:  Pending test results   Any Other Special Instructions Will Be Listed Below (If Applicable).   If you need a refill on your cardiac medications before your next appointment, please call your pharmacy.

## 2024-03-10 ENCOUNTER — Other Ambulatory Visit: Payer: Self-pay | Admitting: Cardiology

## 2024-03-10 DIAGNOSIS — E0789 Other specified disorders of thyroid: Secondary | ICD-10-CM

## 2024-03-12 ENCOUNTER — Telehealth (HOSPITAL_COMMUNITY): Payer: Self-pay | Admitting: *Deleted

## 2024-03-12 ENCOUNTER — Ambulatory Visit: Admitting: Orthopedic Surgery

## 2024-03-12 ENCOUNTER — Encounter: Payer: Self-pay | Admitting: Orthopedic Surgery

## 2024-03-12 VITALS — BP 124/80 | Ht 73.0 in | Wt 250.0 lb

## 2024-03-12 DIAGNOSIS — M1711 Unilateral primary osteoarthritis, right knee: Secondary | ICD-10-CM | POA: Diagnosis not present

## 2024-03-12 DIAGNOSIS — M5090 Cervical disc disorder, unspecified, unspecified cervical region: Secondary | ICD-10-CM | POA: Insufficient documentation

## 2024-03-12 DIAGNOSIS — M75121 Complete rotator cuff tear or rupture of right shoulder, not specified as traumatic: Secondary | ICD-10-CM | POA: Insufficient documentation

## 2024-03-12 DIAGNOSIS — I82409 Acute embolism and thrombosis of unspecified deep veins of unspecified lower extremity: Secondary | ICD-10-CM | POA: Insufficient documentation

## 2024-03-12 MED ORDER — METHYLPREDNISOLONE ACETATE 40 MG/ML IJ SUSP
40.0000 mg | Freq: Once | INTRAMUSCULAR | Status: AC
  Administered 2024-03-12: 40 mg via INTRA_ARTICULAR

## 2024-03-12 NOTE — Patient Instructions (Addendum)
 We will check which brand of Hyaluronic acid injections (there are several) your insurance covers. We will call you with price and schedule with you if insurance approves and you are okay with the out of pocket costs. If for any reason they will not cover or the out of pocket costs are high, we will discuss with you and let you know other options available. This process normally takes several weeks to hear back from us  the insurance approval process takes time.    Tylenol  arthritis every 6 hrs as needed

## 2024-03-12 NOTE — Progress Notes (Signed)
" °  Intake history:  Chief Complaint  Patient presents with   Knee Pain    Right      BP 124/80 Comment: 03/02/24  Ht 6' 1 (1.854 m)   Wt 250 lb (113.4 kg)   BMI 32.98 kg/m  Body mass index is 32.98 kg/m.  Pharmacy? ____WG Scales__________________________________  WHAT ARE WE SEEING YOU FOR TODAY?   Right knee  How long has this bothered you? (DOI?DOS?WS?)  89m c/o pain and swelling  Was there an injury? No  Anticoag.  Yes   Any ALLERGIES ________Allergies[1] ______________________________________   Treatment:  Have you taken:  Tylenol  Yes  Advil No  Had PT No  Had injection No  Other  ___________ice/ heat / tens______________        [1]  Allergies Allergen Reactions   Sulfa Antibiotics     Unknown-was told that   "

## 2024-03-12 NOTE — Progress Notes (Signed)
 "  Office Visit Note   Patient: [name] [name].           Date of Birth: [birth year]           MRN: [id number] Visit Date: 03/12/2024 Requested by: Zollie Lowers, MD [address] [place],  [place] [address] PCP: Zollie Lowers, MD   Assessment & Plan:   Encounter Diagnosis  Name Primary?   Unilateral primary osteoarthritis, right knee Yes    Meds ordered this encounter  Medications   methylPREDNISolone acetate (DEPO-MEDROL) injection 40 mg    [name] [name] is 59 years old he is on Eliquis  he has osteoarthritis of his right knee with recurrent effusion.  He cannot take anti-inflammatories.  He will need frequent aspiration injections of the knee and is a candidate for hyaluronic acid injection.  For pain he can use Tylenol  arthritis every 6 hours as needed  Aspiration injection revealed 50 cc of clear fluid right knee  Procedure :  Aspiration and injection  verbal consent was obtained to aspirate and inject the  right  knee joint   Timeout was completed to confirm the site of aspiration and injection   Medication:  40 mg of Depo-Medrol and 1% lidocaine  3 cc  Anesthesia was provided by ethyl chloride and the skin was prepped with alcohol.  After cleaning the skin with alcohol an 18-gauge needle was used to aspirate the right knee joint.  Fluid obtained: 50 cc  Color: Clear yellow  We followed this by injection of 40 mg of Depo-Medrol and 3 cc 1% lidocaine .  There were no complications. A sterile bandage was applied.     Subjective: Chief Complaint  Patient presents with   Knee Pain    Right     HPI: 59 year old male presents with right knee pain and swelling for the last 5 months.  In 2018 while training he noticed pain and swelling of his right knee at that time and had to have treatment  He presents now with 63-month history of similar symptoms unresponsive to ice heat TENS unit and Tylenol   Unfortunately the patient is on Eliquis   He does have some medical  problems such as sleep apnea history of gout hypertension DVT acute hypoxic respiratory failure CKD stage III  X-rays were done and showed mild OA of the knee              ROS: Noncontributory   Images personally read and my interpretation : Mild 3 compartment disease in the knee x-ray taken at an outside venue  Visit Diagnoses:  1. Unilateral primary osteoarthritis, right knee      Follow-Up Instructions: Return for INJ KNEE HA.    Objective: Vital Signs: BP 124/80 Comment: 03/02/24  Ht 6' 1 (1.854 m)   Wt 250 lb (113.4 kg)   BMI 32.98 kg/m   Physical Exam  Well-developed well-nourished male  Alert and oriented x 3  Mood affect normal  Normal body habitus   Ortho Exam Fairly large right knee effusion grade 3 tenderness medial and lateral joint line and surrounding the knee indicating synovitis.  No instability.  Muscle tone normal  Specialty Comments:  No specialty comments available.  Imaging: No results found.   PMFS History: Patient Active Problem List   Diagnosis Date Noted   Acute deep vein thrombosis of lower limb (HCC) 03/12/2024   Cervical disc disorder 03/12/2024   Complete rotator cuff tear or rupture of right shoulder, not specified as traumatic 03/12/2024  Pulmonary embolism with acute cor pulmonale (HCC) 03/17/2022   Class 2 obesity 03/17/2022   Gastroesophageal reflux disease 03/17/2022   DVT (deep venous thrombosis) (HCC) 03/12/2022   Acute hypoxic respiratory failure (HCC) 03/12/2022   CKD (chronic kidney disease) stage 3, GFR 30-59 ml/min (HCC) 03/12/2022   Protrusion of lumbar intervertebral disc 04/13/2020   Family history of colon cancer 05/12/2019   Other intervertebral disc degeneration, lumbar region 05/06/2019   Primary hypertension 09/01/2018   Asymptomatic varicose veins of unspecified lower extremity 09/01/2018   Cervical disc disorder with myelopathy, mid-cervical region, unspecified level 09/01/2018   Bilateral  sensorineural hearing loss 09/01/2018   Insomnia 10/23/2015   Obstructive sleep apnea 01/16/2015   Gout 01/05/2015   Past Medical History:  Diagnosis Date   DVT (deep venous thrombosis) (HCC)    Gout    Hypertension    Medical history non-contributory    Pulmonary embolism (HCC)    Wears glasses     Family History  Problem Relation Age of Onset   Colon cancer Mother        Precancerous colon polyps; unsure of actual CRC?   Diabetes Father    Hypertension Father    Heart attack Father     Past Surgical History:  Procedure Laterality Date   COLONOSCOPY WITH PROPOFOL  N/A 08/02/2019   Procedure: COLONOSCOPY WITH PROPOFOL ;  Surgeon: Shaaron Lamar HERO, MD;  Location: [facility];  Service: Endoscopy;  Laterality: N/A;  9:15am   KNEE ARTHROSCOPY WITH PATELLAR TENDON REPAIR Left 09/03/2012   Procedure: LEFT KNEE ARTHROSCOPY, LOOSE BODY EXCISION, PATELLAR TENDON REPAIR, EXCISION TIBIAL TUBERCLE SPUR, CHONDROPLASTY PATELLA, EXCISION PLICA;  Surgeon: Toribio JULIANNA Chancy, MD;  Location: [facility];  Service: Orthopedics;  Laterality: Left;   ROTATOR CUFF REPAIR Right    jan 2025   WISDOM TOOTH EXTRACTION     Social History   Occupational History   Not on file  Tobacco Use   Smoking status: Never   Smokeless tobacco: Never  Vaping Use   Vaping status: Never Used  Substance and Sexual Activity   Alcohol use: Yes    Comment: rare   Drug use: No   Sexual activity: Not on file       "

## 2024-03-12 NOTE — Telephone Encounter (Signed)
 Left a message on voice mail reminding patient of his amyloid study on 03/18/2024 at 12:30

## 2024-03-17 ENCOUNTER — Encounter (HOSPITAL_COMMUNITY)

## 2024-03-18 ENCOUNTER — Ambulatory Visit (HOSPITAL_COMMUNITY): Admission: RE | Admit: 2024-03-18 | Source: Ambulatory Visit | Attending: Cardiology | Admitting: Cardiology

## 2024-04-08 ENCOUNTER — Ambulatory Visit (HOSPITAL_COMMUNITY)
Admission: RE | Admit: 2024-04-08 | Discharge: 2024-04-08 | Disposition: A | Discharge status: Home / Self Care | Source: Ambulatory Visit | Attending: Cardiology | Admitting: Cardiology

## 2024-04-08 DIAGNOSIS — E0789 Other specified disorders of thyroid: Secondary | ICD-10-CM | POA: Insufficient documentation

## 2024-04-08 MED ORDER — TC 99M HYDROXYMETHYLENE DIPHOSPHONATE INJECTION
21.4000 | Freq: Once | INTRAVENOUS | Status: AC
  Administered 2024-04-08: 21.4 via INTRAVENOUS

## 2024-04-09 ENCOUNTER — Ambulatory Visit: Payer: Self-pay | Admitting: Cardiology

## 2024-04-09 LAB — MYOCARDIAL AMYLOID PLANAR & SPECT: H/CL Ratio: 1.01

## 2024-04-12 ENCOUNTER — Ambulatory Visit: Payer: Self-pay | Admitting: Cardiology

## 2024-04-13 ENCOUNTER — Encounter: Payer: Self-pay | Admitting: *Deleted

## 2024-04-19 ENCOUNTER — Ambulatory Visit: Admitting: Orthopedic Surgery

## 2024-04-26 ENCOUNTER — Ambulatory Visit: Admitting: Orthopedic Surgery

## 2024-05-03 ENCOUNTER — Ambulatory Visit: Admitting: Orthopedic Surgery

## 2024-05-25 ENCOUNTER — Ambulatory Visit: Payer: Self-pay | Admitting: Family Medicine

## 2024-06-01 ENCOUNTER — Ambulatory Visit: Admitting: Neurology
